# Patient Record
Sex: Male | Born: 1935 | Race: White | Hispanic: No | Marital: Married | Smoking: Former smoker
Health system: Southern US, Community
[De-identification: ages and names within clinical notes are randomized; demographics above are authoritative.]

## PROBLEM LIST (undated history)

## (undated) DIAGNOSIS — E785 Hyperlipidemia, unspecified: Secondary | ICD-10-CM

## (undated) DIAGNOSIS — I2510 Atherosclerotic heart disease of native coronary artery without angina pectoris: Secondary | ICD-10-CM

## (undated) DIAGNOSIS — K219 Gastro-esophageal reflux disease without esophagitis: Secondary | ICD-10-CM

## (undated) DIAGNOSIS — I493 Ventricular premature depolarization: Secondary | ICD-10-CM

## (undated) DIAGNOSIS — I5189 Other ill-defined heart diseases: Secondary | ICD-10-CM

## (undated) DIAGNOSIS — I10 Essential (primary) hypertension: Secondary | ICD-10-CM

## (undated) DIAGNOSIS — I739 Peripheral vascular disease, unspecified: Secondary | ICD-10-CM

## (undated) DIAGNOSIS — I4891 Unspecified atrial fibrillation: Secondary | ICD-10-CM

## (undated) DIAGNOSIS — I471 Supraventricular tachycardia, unspecified: Secondary | ICD-10-CM

## (undated) DIAGNOSIS — I6529 Occlusion and stenosis of unspecified carotid artery: Secondary | ICD-10-CM

## (undated) HISTORY — PX: TOTAL KNEE ARTHROPLASTY: SHX125

## (undated) HISTORY — DX: Hyperlipidemia, unspecified: E78.5

## (undated) HISTORY — DX: Essential (primary) hypertension: I10

## (undated) HISTORY — DX: Atherosclerotic heart disease of native coronary artery without angina pectoris: I25.10

## (undated) HISTORY — PX: CARDIAC CATHETERIZATION: SHX172

---

## 2003-07-11 ENCOUNTER — Other Ambulatory Visit: Payer: Self-pay

## 2003-07-15 ENCOUNTER — Inpatient Hospital Stay (HOSPITAL_COMMUNITY): Admission: EM | Admit: 2003-07-15 | Discharge: 2003-07-17 | Payer: Self-pay | Admitting: Cardiovascular Disease

## 2006-05-19 ENCOUNTER — Ambulatory Visit: Payer: Self-pay | Admitting: Gastroenterology

## 2006-05-26 ENCOUNTER — Emergency Department: Payer: Self-pay | Admitting: Emergency Medicine

## 2006-06-02 ENCOUNTER — Ambulatory Visit: Payer: Self-pay | Admitting: Internal Medicine

## 2006-06-10 ENCOUNTER — Ambulatory Visit: Payer: Self-pay | Admitting: Internal Medicine

## 2007-01-07 ENCOUNTER — Ambulatory Visit: Payer: Self-pay | Admitting: Internal Medicine

## 2007-12-08 ENCOUNTER — Ambulatory Visit: Payer: Self-pay | Admitting: Internal Medicine

## 2008-03-15 ENCOUNTER — Encounter: Payer: Self-pay | Admitting: Cardiovascular Disease

## 2008-04-17 ENCOUNTER — Encounter: Payer: Self-pay | Admitting: Cardiovascular Disease

## 2008-12-19 ENCOUNTER — Ambulatory Visit: Payer: Self-pay

## 2009-02-10 ENCOUNTER — Ambulatory Visit: Payer: Self-pay | Admitting: General Practice

## 2009-02-21 ENCOUNTER — Encounter: Payer: Self-pay | Admitting: Cardiovascular Disease

## 2009-02-24 ENCOUNTER — Ambulatory Visit: Payer: Self-pay | Admitting: General Practice

## 2009-06-04 ENCOUNTER — Ambulatory Visit: Payer: Medicare Other | Admitting: Gastroenterology

## 2009-06-19 ENCOUNTER — Ambulatory Visit: Payer: Self-pay | Admitting: Internal Medicine

## 2009-06-19 DIAGNOSIS — R6889 Other general symptoms and signs: Secondary | ICD-10-CM | POA: Insufficient documentation

## 2009-06-19 DIAGNOSIS — E782 Mixed hyperlipidemia: Secondary | ICD-10-CM | POA: Insufficient documentation

## 2009-07-10 ENCOUNTER — Encounter: Payer: Self-pay | Admitting: Cardiovascular Disease

## 2009-07-22 LAB — CONVERTED CEMR LAB
BUN: 27 mg/dL — ABNORMAL HIGH (ref 6–23)
CO2: 24 meq/L (ref 19–32)
Calcium: 8.7 mg/dL (ref 8.4–10.5)
Chloride: 103 meq/L (ref 96–112)
Cholesterol: 140 mg/dL (ref 0–200)
Creatinine, Ser: 0.97 mg/dL (ref 0.40–1.50)
HDL: 39 mg/dL — ABNORMAL LOW (ref >39)
Total CHOL/HDL Ratio: 3.6

## 2009-07-31 ENCOUNTER — Telehealth: Payer: Self-pay | Admitting: Cardiovascular Disease

## 2009-12-18 ENCOUNTER — Ambulatory Visit: Payer: Self-pay | Admitting: Cardiovascular Disease

## 2009-12-18 DIAGNOSIS — R079 Chest pain, unspecified: Secondary | ICD-10-CM | POA: Insufficient documentation

## 2009-12-18 DIAGNOSIS — I2510 Atherosclerotic heart disease of native coronary artery without angina pectoris: Secondary | ICD-10-CM | POA: Insufficient documentation

## 2009-12-22 ENCOUNTER — Telehealth (INDEPENDENT_AMBULATORY_CARE_PROVIDER_SITE_OTHER): Payer: Self-pay | Admitting: *Deleted

## 2009-12-23 ENCOUNTER — Encounter: Payer: Self-pay | Admitting: Cardiovascular Disease

## 2009-12-23 ENCOUNTER — Encounter (HOSPITAL_COMMUNITY)
Admission: RE | Admit: 2009-12-23 | Discharge: 2010-02-19 | Payer: Self-pay | Source: Home / Self Care | Admitting: Cardiovascular Disease

## 2009-12-23 ENCOUNTER — Ambulatory Visit: Payer: Self-pay

## 2009-12-23 ENCOUNTER — Ambulatory Visit: Payer: Self-pay | Admitting: Cardiovascular Disease

## 2010-05-19 ENCOUNTER — Ambulatory Visit: Payer: Self-pay | Admitting: Cardiovascular Disease

## 2010-05-19 ENCOUNTER — Encounter: Payer: Self-pay | Admitting: Cardiovascular Disease

## 2010-05-27 ENCOUNTER — Telehealth: Payer: Self-pay | Admitting: Cardiovascular Disease

## 2010-06-02 ENCOUNTER — Ambulatory Visit
Admission: RE | Admit: 2010-06-02 | Discharge: 2010-06-02 | Payer: Self-pay | Source: Home / Self Care | Attending: Cardiovascular Disease | Admitting: Cardiovascular Disease

## 2010-06-02 ENCOUNTER — Encounter: Payer: Self-pay | Admitting: Cardiovascular Disease

## 2010-06-04 ENCOUNTER — Ambulatory Visit: Payer: Medicare Other | Admitting: General Practice

## 2010-06-04 LAB — CONVERTED CEMR LAB
ALT: 21 units/L (ref 0–53)
Cholesterol: 149 mg/dL (ref 0–200)
Total CHOL/HDL Ratio: 4.4
Total Protein: 7.2 g/dL (ref 6.0–8.3)
Triglycerides: 217 mg/dL — ABNORMAL HIGH (ref <150)
VLDL: 43 mg/dL — ABNORMAL HIGH (ref 0–40)

## 2010-06-05 ENCOUNTER — Encounter: Payer: Self-pay | Admitting: Cardiovascular Disease

## 2010-06-05 ENCOUNTER — Telehealth: Payer: Self-pay | Admitting: Cardiovascular Disease

## 2010-06-10 ENCOUNTER — Telehealth: Payer: Self-pay | Admitting: Cardiovascular Disease

## 2010-06-15 ENCOUNTER — Inpatient Hospital Stay: Payer: Medicare Other | Admitting: General Practice

## 2010-06-19 ENCOUNTER — Encounter: Payer: Self-pay | Admitting: Cardiovascular Disease

## 2010-06-30 NOTE — Assessment & Plan Note (Signed)
Summary: Cardiology Nuclear Testing  Nuclear Med Background Indications for Stress Test: Evaluation for Ischemia, Stent Patency   History: Heart Catheterization, Myocardial Perfusion Study, Stents  History Comments: '05 Stents x 2-RCA, mid LAD with residual in OM; '09 MPS:no ischemia, EF=42%  Symptoms: Chest Tightness, Dizziness, SOB    Nuclear Pre-Procedure Caffeine/Decaff Intake: None NPO After: 6:00 PM Lungs: clear IV 0.9% NS with Angio Cath: 18g     IV Site: (R) AC IV Started by: Stanton Kidney EMT-P Chest Size (in) 46     Height (in): 71 Weight (lb): 217 BMI: 30.37  Nuclear Med Study 1 or 2 day study:  1 day     Stress Test Type:  Stress Reading MD:  Charlton Haws, MD     Referring MD:  T.Gollan Resting Radionuclide:  Technetium 80m Tetrofosmin     Resting Radionuclide Dose:  11.0 mCi  Stress Radionuclide:  Technetium 44m Tetrofosmin     Stress Radionuclide Dose:  33.0 mCi   Stress Protocol Exercise Time (min):  5:30 min     Max HR:  133 bpm     Predicted Max HR:  145 bpm  Max Systolic BP: 178 mm Hg     Percent Max HR:  91.72 %     METS: 7.0 Rate Pressure Product:  16109    Stress Test Technologist:  Milana Na EMT-P     Nuclear Technologist:  Domenic Polite CNMT  Rest Procedure  Myocardial perfusion imaging was performed at rest 45 minutes following the intravenous administration of Myoview Technetium 40m Tetrofosmin.  Stress Procedure  The patient exercised for 5:30. The patient stopped due to fatigue, sob, and denied any chest pain.  There were no significant ST-T wave changes.  Myoview was injected at peak exercise and myocardial perfusion imaging was performed after a brief delay.  QPS Raw Data Images:  Normal; no motion artifact; normal heart/lung ratio. Stress Images:  NI: Uniform and normal uptake of tracer in all myocardial segments. Rest Images:  Normal homogeneous uptake in all areas of the myocardium. Subtraction (SDS):  0 Transient Ischemic  Dilatation:  1.07  (Normal <1.22)  Lung/Heart Ratio:  .31  (Normal <0.45)  Quantitative Gated Spect Images QGS EDV:  83 ml QGS ESV:  27 ml QGS EF:  67 % QGS cine images:  normal  Findings Low risk nuclear study Evidence for inferior ischemia      Overall Impression  Exercise Capacity: Fair exercise capacity. BP Response: Normal blood pressure response. Clinical Symptoms: Dyspnea ECG Impression: No significant ST segment change suggestive of ischemia. Overall Impression: Mild inferior wall ischemia  Appended Document: Cardiology Nuclear Testing Preliminarily reviewed. Forwarded to MD desktop for review and signature    Please advise what to tell pt.   Appended Document: Cardiology Nuclear Testing Low risk scan. Small region of abnormality where the old stents were put in. If he is feeling ok, would just watch him. Use medications. If symptoms getting worse, or not feeling good, could consider cath.  Appended Document: Cardiology Nuclear Testing LMOM TCB  Appended Document: Cardiology Nuclear Testing pt aware of results.

## 2010-06-30 NOTE — Assessment & Plan Note (Signed)
Summary: CHEST TIGHTNESS/ALT   Visit Type:  Initial Consult Primary Provider:  Roe Coombs Chaplin,M.D.  CC:  c/o chest tightness that radiates to back and shoulder blades.  Shortness of breath when lying flat.Marland Kitchen  History of Present Illness: Christopher [name] is a very pleasant 77 year old gentleman with a history of coronary artery disease, stent placed to his distal RCA in 2005, also stent placed to his mid LAD at the same time with residual 50% obtuse marginal disease with repeat stress test in October 2009 showing no ischemia who presents for followup for symptoms of chest discomfort. He was last seen by myself at [facility] in September 2010.  He reports that over the past week, he had some chest discomfort. It has been constant, dizzy for the past 5-6 days. He feels like he wants to move his arms to make it go away. He has been lifting heavy equipment and wonders if he might have hurt himself. He did take some Aleve though is uncertain if this helped. It is uncertain if he gets worse with exertion. The discomfort is on both sides, lack of rectangle across his whole entire chest. Sometimes it radiates through to his back. He is uncertain if the symptoms are similar to his previous anginal symptoms.  He does have new stress in his life in that his wife has mild dementia. she does not want to be left alone and wants to travel with him wherever he goes.  Previous lipids from November 2009 showed total cholesterol 153, LDL 81.  Cardiac catheter September 2005 shows 90% mid LAD, 80% distal RCA, 50% OM1 at the mid vessel, Taxus stent 2.75 x 24 mm stent to his LAD, TAxus stent 2.5 x 16 mm stent to the RCA.  He reports having a recent carotid ultrasound at an outside facility that was reportedly normal.  EKG shows normal sinus rhythm with rate 68 beats per minute, no significant ST or T wave changes.  Preventive Screening-Counseling & Management  Alcohol-Tobacco     Smoking Status:  never  Caffeine-Diet-Exercise     Does Patient Exercise: yes      Drug Use:  no.    Current Medications (verified): 1)  Simvastatin 80 Mg Tabs (Simvastatin) .... Take One Tablet By Mouth Daily At Bedtime 2)  Plavix 75 Mg Tabs (Clopidogrel Bisulfate) .... Take One Tablet By Mouth Daily 3)  Amlodipine Besy-Benazepril Hcl 10-20 Mg Caps (Amlodipine Besy-Benazepril Hcl) .Marland Kitchen.. 1 By Mouth Once Daily 4)  Aspirin 81 Mg Tbec (Aspirin) .Marland Kitchen.. 1 Once Daily 5)  Vitamin D .... 1 Once Daily  Allergies (verified): No Known Drug Allergies  Past History:  Past Surgical History: CAD;Stents x 2 Right knee surg.  Family History: [id number]  Social History: Retired  Married  Tobacco Use - No.  Alcohol Use - no Regular Exercise - yes--walks 30 min. everyday. Drug Use - no Smoking Status:  never Does Patient Exercise:  yes Drug Use:  no  Review of Systems       The patient complains of chest pain.  The patient denies fever, weight loss, weight gain, vision loss, decreased hearing, hoarseness, syncope, dyspnea on exertion, peripheral edema, prolonged cough, abdominal pain, incontinence, muscle weakness, depression, and enlarged lymph nodes.    Vital Signs:  Patient profile:   77 year old male Height:      71 inches Weight:      219 pounds BMI:     30.65 Pulse rate:   68 / minute BP  sitting:   128 / 74  (left arm) Cuff size:   regular  Vitals Entered By: Bishop Dublin, CMA (December 18, 2009 10:42 AM)  Physical Exam  General:  Well developed, well nourished, in no acute distress. Head:  normocephalic and atraumatic Neck:  Neck supple, no JVD. No masses, thyromegaly or abnormal cervical nodes. Lungs:  Clear bilaterally to auscultation and percussion. Heart:  Non-displaced PMI, chest non-tender; regular rate and rhythm, S1, S2 without murmurs, rubs or gallops. Carotid upstroke normal, no bruit.  Pedals normal pulses. No edema, no varicosities. Abdomen:  Bowel sounds positive; abdomen soft  and non-tender without masses Msk:  Back normal, normal gait. Muscle strength and tone normal. Pulses:  pulses normal in all 4 extremities Extremities:  No clubbing or cyanosis. Neurologic:  Alert and oriented x 3. Skin:  Intact without lesions or rashes. Psych:  Normal affect.   Impression & Recommendations:  Problem # 1:  CHEST PAIN UNSPECIFIED (ICD-786.50) I am concerned about his chest pain given his history of coronary artery disease. This may be new angina over the past week. We'll set him up with a treadmill Myoview in [place]. He is in agreement with this as he is very concerned about his symptoms.  His updated medication list for this problem includes:    Plavix 75 Mg Tabs (Clopidogrel bisulfate) .Marland Kitchen... Take one tablet by mouth daily    Amlodipine Besy-benazepril Hcl 10-20 Mg Caps (Amlodipine besy-benazepril hcl) .Marland Kitchen... 1 by mouth once daily    Aspirin 81 Mg Tbec (Aspirin) .Marland Kitchen... 1 once daily  Orders: Nuclear Stress Test (Nuc Stress Test)  Problem # 2:  MIXED HYPERLIPIDEMIA (ICD-272.2) Previous lipids have been well controlled. He is on simvastatin 80 mg a day and we will decrease this to 40 mg daily for now and change him to Lipitor in November when this goes generic.  His updated medication list for this problem includes:    Simvastatin 80 Mg Tabs (Simvastatin) .Marland Kitchen... Take 1/2 tablet by mouth daily at bedtime  Other Orders: EKG w/ Interpretation (93000)  Patient Instructions: 1)  Your physician has recommended you make the following change in your medication: Simvastatin 80mg  take 1/2 daily 2)  Your physician has requested that you have an exercise stress myoview.  For further information please visit [email or web address]  Please follow instruction sheet, as given. 3)  Your physician wants you to follow-up in:   6 months You will receive a reminder letter in the mail two months in advance. If you don't receive a letter, please call our office to schedule the follow-up  appointment.

## 2010-06-30 NOTE — Cardiovascular Report (Signed)
Summary: Cardiac Cath Other  Cardiac Cath Other   Imported By: West Carbo 12/18/2009 15:36:28  _____________________________________________________________________  External Attachment:    Type:   Image     Comment:   External Document

## 2010-06-30 NOTE — Progress Notes (Signed)
Summary: Drug Interaction  Phone Note From Pharmacy Call back at [phone]   Caller: [facility] Call For: Dr. Mariah Milling  Summary of Call: [facility] called and left voicemail that there was a drug interaction on the medication(Lansoprazole) that was sent to them with his other meds.  Reference number is [id number] when you call them back. Initial call taken by: West Carbo,  July 31, 2009 7:57 AM  Follow-up for Phone Call        per Dr. Mariah Milling- pt can stay on nexium or switch to OTC acid medication- new rx can have severe interaction with plavix.  pt has decided to remain on nexium  Follow-up by: Charlena Cross, RN, BSN,  August 01, 2009 11:41 AM

## 2010-06-30 NOTE — Progress Notes (Signed)
Summary: RX  Phone Note Call from Patient Call back at Home Phone [phone]   Caller: SELF Call For: [facility] Summary of Call: PLEASE CALL [facility] [phone]-QUESTIONS ABOUT AN RX Initial call taken by: Harlon Flor,  July 31, 2009 3:09 PM  Follow-up for Phone Call        taken care of per [facility] Follow-up by: Mercer Pod,  August 05, 2009 10:11 AM

## 2010-06-30 NOTE — Progress Notes (Signed)
Summary: NUCLEAR PRE PROCEDURE  Phone Note Outgoing Call Call back at [facility] Phone [phone]   Call placed by: Rea College, CMA,  December 22, 2009 1:58 PM Call placed to: Patient Summary of Call: Left message with information on Myoview Information Sheet (see scanned document for details).      Nuclear Med Background Indications for Stress Test: Evaluation for Ischemia, Stent Patency   History: Heart Catheterization, Myocardial Perfusion Study, Stents  History Comments: '05 Stents x 2-RCA, mid LAD with residual in OM; '09 MPS:no ischemia, EF=42%     Nuclear Pre-Procedure Height (in): 71

## 2010-06-30 NOTE — Progress Notes (Signed)
Summary: Office Visit  Office Visit   Imported By: West Carbo 12/18/2009 15:37:01  _____________________________________________________________________  External Attachment:    Type:   Image     Comment:   External Document

## 2010-07-02 NOTE — Progress Notes (Signed)
Summary: Plavix clearance  Phone Note Call from Patient Call back at Home Phone [phone]   Caller: Self Call For: Michaeleen Down Summary of Call: [facility] wants the pt to come off of Plavix 7 days before surgery on the 16th.  [facility] needs this to be cleared by [facility] with a letter.  Please call the pt to inform of when to come off of the medication. Initial call taken by: Harlon Flor,  June 05, 2010 9:28 AM  Follow-up for Phone Call        Is having a total knee replacement and needs to come off plavix Monday 06/08/2010. Follow-up by: Bishop Dublin, CMA,  June 05, 2010 3:09 PM  Additional Follow-up for Phone Call Additional follow up Details #1::        Notified Dr. Elenor Legato office regarding coming off plavix Monday prior to knee surg. Told to come off Monday Jan. 9, 2012. Additional Follow-up by: Bishop Dublin, CMA,  June 05, 2010 3:30 PM

## 2010-07-02 NOTE — Progress Notes (Signed)
  Phone Note Call from Patient   Caller: Patient Summary of Call: Needs to have 30 day supply sent to [facility] for Omeprazole 20 mg one tablet once daily. Initial call taken by: Bishop Dublin, CMA,  June 10, 2010 8:44 AM  Follow-up for Phone Call        Phone Call Completed Follow-up by: Bishop Dublin, CMA,  June 10, 2010 8:44 AM    Prescriptions: OMEPRAZOLE 20 MG CPDR (OMEPRAZOLE) Take one tab by mouth once daily.  #30 x 6   Entered by:   Bishop Dublin, CMA   Authorized by:   Dossie Arbour MD   Signed by:   Bishop Dublin, CMA on 06/10/2010   Method used:   [facility]  [address] [phone] (retail)       [address]       [place], [place]  [address]       Ph: [phone] or [phone]       Fax: [phone]   RxID:   [id number]

## 2010-07-02 NOTE — Letter (Signed)
Summary: Surgical Clearance  Surgical Clearance   Imported By: Harlon Flor 06/08/2010 10:28:49  _____________________________________________________________________  External Attachment:    Type:   Image     Comment:   External Document

## 2010-07-02 NOTE — Progress Notes (Signed)
Summary: Surgical Clearance and RX  Phone Note Call from Patient Call back at Home Phone [phone]   Caller: Self Call For: Gollan Summary of Call: Pt needs surgical clearance for knee surgery.  Please fax to Dr. Ernest Pine @ [phone].  Pt called last week about having the generic form of Nexium sent to [facility] and has not heard anything about whether or not this has been taken care of.  Please make sure this has been sent to #[phone]. Initial call taken by: Harlon Flor,  May 27, 2010 3:29 PM  Follow-up for Phone Call        Faxed ov note from Dr. Mariah Milling for surgical clearance to Dr. Ernest Pine 12.28.11. Spoke to pt, notified we had sent rx in to [facility] and we received notification that insurance would not cover this med at [facility]. Pt is aware and states he spoke with [facility] and they said they had him in the system and he thinks they will fill this up until end of this year and pt will try this. Otherwise, pt will notify us with a different pharmacy for [place] to send rx.  Follow-up by: Lanny Hurst RN,  May 28, 2010 10:00 AM

## 2010-07-02 NOTE — Assessment & Plan Note (Signed)
Summary: F/U 6 MONTHS   Visit Type:  Follow-up Primary Provider:  Roe Coombs Chaplin,M.D.  CC:  F/U 6 months; denies chest pain or shortness of breath. Pre-op clearance for left knee replacement scheduled for January 16 and 2011 with Dr. Ernest Pine.Marland Kitchen  History of Present Illness: Christopher [name] is a very pleasant 77 year old gentleman with a history of coronary artery disease, stent placed to his distal RCA in 2005, also stent placed to his mid LAD at the same time with residual 50% obtuse marginal disease with repeat stress test in October 2009 showing no ischemia who presents for routine followup.   On his last clinic visit, he had episodes of chest pain. He has significant stress in his life as his wife has dementia. He has been feeling well more recently with no chest pain though does have significant stress with his wife. She does not want to leave his side.   He has been taking his medications with no significant side effects. No significant shortness of breath with exertion. He does have determined his knee pain and is here for preoperative evaluation.  Previous lipids from November 2009 showed total cholesterol 153, LDL 81.  Cardiac catheter September 2005 shows 90% mid LAD, 80% distal RCA, 50% OM1 at the mid vessel, Taxus stent 2.75 x 24 mm stent to his LAD, TAxus stent 2.5 x 16 mm stent to the RCA.  He reports having a recent carotid ultrasound at an outside facility that was reportedly normal.  recent stress test in July 2011 showed mild ischemia in the inferior wall consistent with previous stress test  EKG shows normal sinus rhythm with rate 68 beats per minute, no significant ST or T wave changes.  Current Medications (verified): 1)  Simvastatin 80 Mg Tabs (Simvastatin) .... Take 1/2 Tablet By Mouth Daily At Bedtime 2)  Plavix 75 Mg Tabs (Clopidogrel Bisulfate) .... Take One Tablet By Mouth Daily 3)  Amlodipine Besy-Benazepril Hcl 10-20 Mg Caps (Amlodipine Besy-Benazepril Hcl) .Marland Kitchen.. 1 By Mouth  Once Daily 4)  Aspirin 81 Mg Tbec (Aspirin) .Marland Kitchen.. 1 Once Daily 5)  Vitamin D .... 1 Once Daily  Allergies (verified): No Known Drug Allergies  Past History:  Past Medical History: Last updated: 07/10/2009 CAd stent to the mid LAD and distal  RCA 2005 Neg-stress test no significant ischemia hyperlipidemia hypertension  Past Surgical History: Last updated: 12/18/2009 CAD;Stents x 2 Right knee surg.  Family History: Last updated: 12/18/2009 [id number]  Social History: Last updated: 12/18/2009 Retired  Married  Tobacco Use - No.  Alcohol Use - no Regular Exercise - yes--walks 30 min. everyday. Drug Use - no  Risk Factors: Exercise: yes (12/18/2009)  Risk Factors: Smoking Status: never (12/18/2009)  Review of Systems       The patient complains of difficulty walking.  The patient denies fever, weight loss, weight gain, vision loss, decreased hearing, hoarseness, chest pain, syncope, dyspnea on exertion, peripheral edema, prolonged cough, abdominal pain, incontinence, muscle weakness, depression, and enlarged lymph nodes.         Knee pain  Vital Signs:  Patient profile:   77 year old male Height:      71 inches Weight:      221 pounds BMI:     30.93 Pulse rate:   68 / minute BP sitting:   124 / 68  (left arm) Cuff size:   regular  Vitals Entered By: Bishop Dublin, CMA (May 19, 2010 3:29 PM)  Physical Exam  General:  Well developed, well nourished,  in no acute distress. Head:  normocephalic and atraumatic Neck:  Neck supple, no JVD. No masses, thyromegaly or abnormal cervical nodes. Lungs:  Clear bilaterally to auscultation and percussion. Heart:  Non-displaced PMI, chest non-tender; regular rate and rhythm, S1, S2 without murmurs, rubs or gallops. Carotid upstroke normal, no bruit.  Pedals normal pulses. No edema, no varicosities. Abdomen:  Bowel sounds positive; abdomen soft and non-tender without masses Msk:  Back normal, normal gait. Muscle strength  and tone normal. Pulses:  pulses normal in all 4 extremities Extremities:  No clubbing or cyanosis. Neurologic:  Alert and oriented x 3. Skin:  Intact without lesions or rashes. Psych:  Normal affect.   Impression & Recommendations:  Problem # 1:  CAD (ICD-414.00) no symptoms of angina at this time. Recent stress test that is essentially unchanged with mild inferior wall abnormality. No further testing needed. He would be low risk for his oncoming knee surgery with Dr. Ernest Pine.  I will suggest that IV fluids be minimized given suspected diastolic dysfunction and the risk for perioperative arrhythmia. I will start him on metoprolol 12.5 mg b.i.d. now to be continued indefinitely as tolerated.  His updated medication list for this problem includes:    Plavix 75 Mg Tabs (Clopidogrel bisulfate) .Marland Kitchen... Take one tablet by mouth daily    Amlodipine Besy-benazepril Hcl 10-20 Mg Caps (Amlodipine besy-benazepril hcl) .Marland Kitchen... 1 by mouth once daily    Aspirin 81 Mg Tbec (Aspirin) .Marland Kitchen... 1 once daily    Metoprolol Tartrate 25 Mg Tabs (Metoprolol tartrate) .Marland Kitchen... Take 1/2 tablet by mouth two times a day.  Problem # 2:  MIXED HYPERLIPIDEMIA (ICD-272.2) We have suggested that we check his cholesterol prior to his knee surgery in January as his dose of simvastatin has decreased from 80 mg to 40 mg. Goal LDL is less than 70.  His updated medication list for this problem includes:    Simvastatin 80 Mg Tabs (Simvastatin) .Marland Kitchen... Take 1/2 tablet by mouth daily at bedtime  Patient Instructions: 1)  Your physician recommends that you schedule a follow-up appointment in: 6 months 2)  Your physician recommends that you return for a FASTING lipid profile: LIPID/LFT'S JAN 2012 3)  Your physician has recommended you make the following change in your medication: START Metoprolol Tartrate 25mg  1/2 tab two times a day. START Omeprazole 20mg  once daily. Prescriptions: OMEPRAZOLE 20 MG CPDR (OMEPRAZOLE) Take one tab by  mouth once daily.  #30 x 6   Entered by:   Lanny Hurst RN   Authorized by:   Christopher Arbour MD   Signed by:   Lanny Hurst RN on 05/19/2010   Method used:   [facility]axed to ...       [facility] [place] [facility]             , [place]         Ph: [phone]       Fax: [phone]   RxID:   [id number] METOPROLOL TARTRATE 25 MG TABS (METOPROLOL TARTRATE) Take 1/2 tablet by mouth two times a day.  #90 x 3   Entered by:   Lanny Hurst RN   Authorized by:   Christopher Arbour MD   Signed by:   Lanny Hurst RN on 05/19/2010   Method used:   Faxed to ...       [facility] [place] [facility]retail)             , [place]         Ph: [phone]  Fax: [phone]   RxID:   [id number]

## 2010-07-08 NOTE — Miscellaneous (Signed)
[facility]   Imported By: Harlon Flor 07/02/2010 14:45:42  _____________________________________________________________________  External Attachment:    Type:   Image     Comment:   External Document

## 2010-07-30 ENCOUNTER — Telehealth: Payer: Self-pay | Admitting: Cardiovascular Disease

## 2010-08-06 NOTE — Progress Notes (Signed)
Summary: Metoprolol  Phone Note Call from Patient Call back at Home Phone [phone]   Caller: SELF Call For: [facility] Summary of Call: Pt would like to know if he is to continue Metoprolol or if this was a medication that was started solely for his surgery. Initial call taken by: Harlon Flor,  July 30, 2010 8:51 AM  Follow-up for Phone Call        notified patient need to continue with the metoprolol.  Patient was just making sure of this before had refilled. Follow-up by: Bishop Dublin, CMA,  July 30, 2010 1:03 PM

## 2010-08-26 ENCOUNTER — Telehealth: Payer: Self-pay

## 2010-08-26 MED ORDER — CLOPIDOGREL BISULFATE 75 MG PO TABS: 75.0000 mg | ORAL_TABLET | Freq: Every day | ORAL | Status: DC

## 2010-08-26 NOTE — Telephone Encounter (Signed)
Needs refill on Plavix 75mg  one tablet daily.  Send to [facility] for 90 day supply.

## 2010-08-28 ENCOUNTER — Other Ambulatory Visit: Payer: Self-pay | Admitting: Cardiovascular Disease

## 2010-08-28 MED ORDER — CLOPIDOGREL BISULFATE 75 MG PO TABS: 75.0000 mg | ORAL_TABLET | Freq: Every day | ORAL | Status: DC

## 2010-08-28 NOTE — Telephone Encounter (Signed)
Refill sent to [facility] for plavix 90 day supply.

## 2010-08-28 NOTE — Telephone Encounter (Signed)
Rx faxed to [facility] for plavix 90 day supply.

## 2010-08-28 NOTE — Telephone Encounter (Signed)
Needs Plavix sent to [facility].  Pt refused the Rx that was sent to the pharmacy.

## 2010-10-16 NOTE — Cardiovascular Report (Signed)
NAME:  Christopher [name], Christopher [name]                            ACCOUNT NO.:  [id number]   MEDICAL RECORD NO.:  [id number]                   PATIENT TYPE:  INP   LOCATION:  6599                                 FACILITY:  [facility]   PHYSICIAN:  Madaline Savage, M.D.             DATE OF BIRTH:  [birth year]   DATE OF PROCEDURE:  07/16/2003  DATE OF DISCHARGE:                              CARDIAC CATHETERIZATION   PROCEDURES PERFORMED:  1. Selective coronary angiography by Judkins technique.  2. Retrograde left heart catheterization.  3. Left ventricular angiography.  4. Abdominal aortography.  5. Percutaneous coronary intervention with stenting of the mid left anterior     descending.  6. Percutaneous coronary stenting of the distal right coronary artery.   CARDIOLOGIST:  Madaline Savage, M.D.   COMPLICATIONS:  None.   ENTRY SITE:  Right femoral.   CONTRAST MATERIAL:  Dye used; Omnipaque.   MEDICATIONS:  Medications given; Angiomax during the procedure with an ACT  of approximately 430 seconds; 600 mg of Plavix was given in split doses of  300 at the beginning of the case and 300 mg at the end of the case, fentanyl  for sedation, and intravenous morphine for chest pain.   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  One of his parents had coronary  disease; and, he has had negative cardiac enzymes and EKGs prior to cath lab  entry.   RESULTS:   PRESSURES:  The left ventricular pressure  was 115/0, end-diastolic pressure  11.  Central aortic pressure 115/65, mean of 90.  No aortic valve gradient  by pullback technique.   ANGIOGRAPHIC DATA:  Left Main Coronary Artery:  The left main coronary  artery was large in caliber, short in length and showed no lesions.   Left Anterior Coronary Artery:  The left anterior descending coronary artery  coursed the  cardiac apex and gave rise to two small diagonal branches and a  third smaller diagonal branch.  There were three septal perforator branches  noted.  The LAD showed a lesion in the midportion of the vessel between  septal perforators #1 and #2 over a course of about 20-22 mm in severity,  and a gradually tapering lesion of 90% severity.  It was long and it was  mildly calcified.   Circumflex Coronary Artery:  The circumflex coronary artery gave rise to one  trifurcating obtuse marginal branch, which contained a 50% lesion in the  midportion of this vessel before either of the two branches more distally.  The circumflex itself coursed through the AV groove and gave rise to a  bifurcating atrial circumflex branch.  The circumflex itself contained no  significant lesions.   Right Coronary Artery:  The right coronary artery was technically the  dominant vessel  with circulation giving rise to a medium-sized posterior  descending branch and also gave rise to two acute marginal branches.  There  was a lesion of 80% in the distal portion of the RCA just before the PDA  arose.   Left Ventricular Angiography:  Left ventricular angiography showed normal  left ventricular contractility, no wall motion abnormalities and an ejection  fraction estimated at 60-65% with only a whiff of catheter-induced mitral  regurgitation.   AORTOGRAPHIC DATA:  Abdominal aortography showed bilateral patency of the  renal arteries.  The right renal artery actually contained about a 20-30%  plaque in its proximal portion, but the vessel was large in diameter.  The  abdominal  aorta was smooth and the common iliacs were normal.   DESCRIPTION OF INTERVENTIONAL PROCEDURE:  Percutaneous coronary stenting was  accomplished by switching the 6 French sheath system from diagnostic  catheterization over to a 7 French sheath system for the percutaneous  intervention.  A 7 French Judkins left guide catheter was used with a 180  cm  Forte wire.  The LAD was predilated with a 2.5 x 15 Maverick balloon  inflated to four atmospheres of pressure because of my concern that we may  not be able to cross easily with the TAXUS stent due to the severity of the  90% lesion.   Next, I strategically placed a 24 mm x 2.75 TAXUS stent just prior to the  takeoff of the septal branch and near the second septal perforator branch,  which covered the stenotic mid LAD stenosis well.  I inflated to peak  atmospheres of 17 atmospheres on two occasions corresponding to an  anticipated inner diameter of 3 mm.  TIMI III flow was preserved distally  and smooth contours were noted after stenting.   The circumflex lesion was not addressed at this time.   The right coronary artery lesion was addressed with a 7 French right guide  catheter with side holes , the same Forte, and was direct stented with a 2.5  x 16 mm TAXUS stent.  This TAXUS stent was deployed twice to 16 atmospheres  of pressure and the 80% short segmental stenosis was reduced to 0% residual  with preservation of TIMI III Class distal flow.   The patient tolerated the procedure well.  The LAD stenting caused 8/10  chest pain.  It persisted for some eight to 10 minutes, probably related to  the second diagonal branch being jailed, but this pain went down to 1/10 by  the end of the case.  No hemodynamic or rhythm complications occurred.   FINAL IMPRESSION:  1. Three-vessel coronary artery disease:     A. Ninety percent mid left anterior descending stenosis.     B. Fifty percent first obtuse marginal branch stenosis, midvessel.     C. Eighty percent distal right coronary artery stenosis.  2. Normal left ventricular systolic function.  3. Normal renal arteries.  4. Normal abdominal aorta.  5. Successful stenting of the mid left anterior descending with reduction of     a 90% lesion to 0% residual. 6. Successful stenting of the distal right coronary artery with 80% lesion      reduced to 0% residual.   PLAN:  The patient will have aspirin and Plavix for six months.   The patient received Angiomax during the case and the Angiomax was  discontinued following completion of the procedure.  Madaline Savage, M.D.    WHG/MEDQ  D:  07/16/2003  T:  07/17/2003  Job:  [id number]   cc:   Darlin Priestly, M.D.  [address]  [place]  [place] [address]  Fax: [phone]   [facility]  [place], [place] [facility]

## 2010-10-16 NOTE — Discharge Summary (Signed)
NAME:  Christopher [name], Christopher [name]                            ACCOUNT NO.:  [id number]   MEDICAL RECORD NO.:  [id number]                   PATIENT TYPE:  INP   LOCATION:  6523                                 FACILITY:  [facility]   PHYSICIAN:  Nanetta Batty, M.D.                DATE OF BIRTH:  24-Dec-1931   DATE OF ADMISSION:  07/15/2003  DATE OF DISCHARGE:  07/17/2003                                 DISCHARGE SUMMARY   ATTENDING PHYSICIANS:  1. Dr. Nanetta Batty.  2. Dr. Madaline Savage.   ADMISSION DIAGNOSES:  1. Chest pain, rule out angina.  2. Unknown lipid status.   DISCHARGE DIAGNOSES:  1. Severe two-vessel coronary artery disease with angina, status post     percutaneous transluminal coronary angioplasty and TAXUS stent x2.  2. Hyperlipidemia.   BRIEF HISTORY:  The patient is a 77 year old white male, a medical patient  of Dr. Conchita Paris in [place], [place], who had been referred to  Dr. Darlin Priestly for initial evaluation and was scheduled to come the  week of his admission.  He had no prior cardiac history prior to this.  He  had his first episode of chest discomfort on a Wednesday while he was  working in the yard.  He has had a recurrence and ultimately came to the ER  at [facility], had a Cardiolite which showed a normal ejection fraction of  69%, but some inferior myocardial perfusion defects consistent with prior  infarct and scar.  There was no evidence of ischemia at that time.  He now  presents with chest pain.  He was seen in the ER by Dr. Allyson Sabal and was  admitted for further evaluation and treatment as indicated.  His EKG showed  a ventricular bigeminy.  He was placed on heparin, nitroglycerin, aspirin,  beta blockers and scheduled for cardiac catheterization.   PAST MEDICAL HISTORY:  No prior hospitalizations.   CURRENT MEDICATIONS:  Prilosec.   SOCIAL HISTORY:  He just retired, about 1 week.  No history of alcohol and  no history of tobacco.   FAMILY  HISTORY:  Mother had a questionable history of angina with PCI.  No  other family history.   ALLERGIES:  None.   COMMENT:  For further H&P, see the dictated note.   HOSPITAL COURSE:  The patient was admitted.  He was stable on IV  nitroglycerin and heparin.  He underwent cardiac catheterization on July 15, 2002.  This showed a 90% stenosis of the LAD.  He had a 50% stenosis of  the midportion of the first OM.  He had an 80% distal RCA before the takeoff  of the PD and PL.  He underwent PCI and TAXUS stenting to the proximal LAD  lesion and the distal RCA lesion.  He tolerated this well and returned to  the floor and telemetry unit in satisfactory condition.  The following day,  the labs were normal with a hemoglobin of 14, hematocrit of 40, white count  of 8.5, platelets 161,000.  Electrolytes were normal with a BUN of 12, a  creatinine of 1.1, a glucose of 103.  His cholesterol was 177, triglycerides  are 236, HDL is 31, LDL was 99.  Overall, he was doing well, he had no  further chest discomfort.  He was stable on Lopressor at 25 mg b.i.d.  We  plan to discharge the patient home today.   DISCHARGE MEDICATIONS:  1. Toprol-XL 25 mg daily.  2. He will resume his Prilosec 1 daily at home.  3. Vytorin 10/20 one daily.  4. Plavix 75 mg daily.  5. Aspirin 81 mg daily.   FOLLOWUP:  We will arrange followup with Dr. Madaline Savage in 2 weeks.  He is instructed to contact Dr. Candelaria Stagers for followup in his office.   DISCHARGE ACTIVITY:  Light to moderate, no lifting over 10 pounds, no  driving for 1 week.   CONDITION ON DISCHARGE:  Improved.      Eber Hong, P.A.                 Nanetta Batty, M.D.    WDJ/MEDQ  D:  07/17/2003  T:  07/17/2003  Job:  [id number]   cc:   Conchita Paris  [address]  [place]  [place] [address]  Fax: [phone]

## 2010-11-03 ENCOUNTER — Encounter: Payer: Self-pay | Admitting: Cardiovascular Disease

## 2010-11-03 ENCOUNTER — Ambulatory Visit (INDEPENDENT_AMBULATORY_CARE_PROVIDER_SITE_OTHER): Payer: Medicare Other | Admitting: Cardiovascular Disease

## 2010-11-03 DIAGNOSIS — R6889 Other general symptoms and signs: Secondary | ICD-10-CM

## 2010-11-03 DIAGNOSIS — R079 Chest pain, unspecified: Secondary | ICD-10-CM

## 2010-11-03 DIAGNOSIS — I2510 Atherosclerotic heart disease of native coronary artery without angina pectoris: Secondary | ICD-10-CM

## 2010-11-03 DIAGNOSIS — E782 Mixed hyperlipidemia: Secondary | ICD-10-CM

## 2010-11-03 MED ORDER — ATORVASTATIN CALCIUM 80 MG PO TABS: 80.0000 mg | ORAL_TABLET | Freq: Every day | ORAL | Status: DC

## 2010-11-03 NOTE — Progress Notes (Signed)
   Patient ID: Christopher [name], male    DOB: 10-06-1931, 77 y.o.   MRN: [id number]  HPI Comments: Christopher [name] is a very pleasant 77 year old gentleman with a history of coronary artery disease, stent placed to his distal RCA in 2005, also stent placed to his mid LAD at the same time with residual 50% obtuse marginal disease with repeat stress test in October 2009 showing no ischemia who presents for routine followup.     He reports that he feels well with no significant symptoms of chest pain. He has significant stress in his life as his wife has dementia.  She does not want to leave his side.  His routine has become significantly limited. 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 He did have a successful left total knee replacement in January of this year.   He has been taking his medications with no significant side effects. No significant shortness of breath with exertion.   Cardiac catheter September 2005 shows 90% mid LAD, 80% distal RCA, 50% OM1 at the mid vessel, Taxus stent 2.75 x 24 mm stent to his LAD, TAxus stent 2.5 x 16 mm stent to the RCA.   He reports having a recent carotid ultrasound at an outside facility that was reportedly normal.   stress test in July 2011 showed mild ischemia in the inferior wall consistent with previous stress test   EKG shows normal sinus rhythm with rate 60 beats per minute, no significant ST or T wave changes.      Review of Systems  Constitutional: Negative.   HENT: Negative.   Eyes: Negative.   Respiratory: Negative.   Cardiovascular: Negative.   Gastrointestinal: Negative.   Musculoskeletal: Negative.   Skin: Negative.   Neurological: Negative.   Hematological: Negative.   Psychiatric/Behavioral: Negative.   All other systems reviewed and are negative.    BP 124/68  Pulse 60  Ht 5\' 11"  (1.803 m)  Wt 217 lb 12.8 oz (98.793 kg)  BMI 30.38 kg/m2   Physical Exam  Nursing note and  vitals reviewed. Constitutional: He is oriented to person, place, and time. He appears well-developed and well-nourished.  HENT:  Head: Normocephalic.  Nose: Nose normal.  Mouth/Throat: Oropharynx is clear and moist.  Eyes: Conjunctivae are normal. Pupils are equal, round, and reactive to light.  Neck: Normal range of motion. Neck supple. No JVD present.  Cardiovascular: Normal rate, regular rhythm, S1 normal, S2 normal, normal heart sounds and intact distal pulses.  Exam reveals no gallop and no friction rub.   No murmur heard. Pulmonary/Chest: Effort normal and breath sounds normal. No respiratory distress. He has no wheezes. He has no rales. He exhibits no tenderness.  Abdominal: Soft. Bowel sounds are normal. He exhibits no distension. There is no tenderness.  Musculoskeletal: Normal range of motion. He exhibits no edema and no tenderness.  Lymphadenopathy:    He has no cervical adenopathy.  Neurological: He is alert and oriented to person, place, and time. Coordination normal.  Skin: Skin is warm and dry. No rash noted. No erythema.  Psychiatric: He has a normal mood and affect. His behavior is normal. Judgment and thought content normal.           Assessment and Plan

## 2010-11-03 NOTE — Assessment & Plan Note (Signed)
Currently with no symptoms of angina. No further workup at this time. Continue current medication regimen. 

## 2010-11-03 NOTE — Assessment & Plan Note (Signed)
No chest pain symptoms at this time. No further testing has been ordered. Stress test done last year showing no significant ischemia.

## 2010-11-03 NOTE — Assessment & Plan Note (Signed)
He is still on high-dose simvastatin and as he is on amlodipine, we will change this to Lipitor 40 mg daily

## 2010-11-03 NOTE — Patient Instructions (Signed)
You are doing well. Please stop simvastatin and start lipitor  Please call us if you have new issues that need to be addressed before your next appt.  We will call you for a follow up Appt. In 6 months

## 2010-12-04 ENCOUNTER — Other Ambulatory Visit: Payer: Self-pay | Admitting: Cardiovascular Disease

## 2010-12-04 MED ORDER — CLOPIDOGREL BISULFATE 75 MG PO TABS: 75.0000 mg | ORAL_TABLET | Freq: Every day | ORAL | Status: DC

## 2010-12-04 NOTE — Telephone Encounter (Signed)
Would like the generic Plavix for 90 days.

## 2010-12-28 ENCOUNTER — Other Ambulatory Visit: Payer: Self-pay

## 2010-12-28 ENCOUNTER — Telehealth: Payer: Self-pay

## 2010-12-28 MED ORDER — OMEPRAZOLE 20 MG PO CPDR: 20.0000 mg | DELAYED_RELEASE_CAPSULE | Freq: Every day | ORAL | Status: DC

## 2010-12-28 NOTE — Telephone Encounter (Signed)
Needs a refill on omeprazole

## 2010-12-29 ENCOUNTER — Other Ambulatory Visit: Payer: Self-pay

## 2010-12-29 MED ORDER — OMEPRAZOLE 20 MG PO CPDR: 20.0000 mg | DELAYED_RELEASE_CAPSULE | Freq: Every day | ORAL | Status: DC

## 2010-12-29 MED ORDER — METOPROLOL TARTRATE 25 MG PO TABS: 12.5000 mg | ORAL_TABLET | Freq: Two times a day (BID) | ORAL | Status: DC

## 2010-12-30 NOTE — Telephone Encounter (Signed)
Duplicate message. 

## 2011-01-26 ENCOUNTER — Telehealth: Payer: Self-pay

## 2011-01-26 MED ORDER — OMEPRAZOLE 20 MG PO CPDR: 20.0000 mg | DELAYED_RELEASE_CAPSULE | Freq: Every day | ORAL | Status: DC

## 2011-01-26 NOTE — Telephone Encounter (Signed)
Refill requested for omeprazole.

## 2011-05-07 ENCOUNTER — Encounter: Payer: Self-pay | Admitting: Cardiovascular Disease

## 2011-05-19 ENCOUNTER — Encounter: Payer: Self-pay | Admitting: Cardiovascular Disease

## 2011-05-19 ENCOUNTER — Ambulatory Visit (INDEPENDENT_AMBULATORY_CARE_PROVIDER_SITE_OTHER): Payer: Medicare Other | Admitting: Cardiovascular Disease

## 2011-05-19 VITALS — BP 130/80 | HR 64 | Ht 71.0 in | Wt 223.0 lb

## 2011-05-19 DIAGNOSIS — E782 Mixed hyperlipidemia: Secondary | ICD-10-CM

## 2011-05-19 DIAGNOSIS — I739 Peripheral vascular disease, unspecified: Secondary | ICD-10-CM

## 2011-05-19 DIAGNOSIS — I2510 Atherosclerotic heart disease of native coronary artery without angina pectoris: Secondary | ICD-10-CM

## 2011-05-19 NOTE — Assessment & Plan Note (Signed)
Currently with no symptoms of angina. No further workup at this time. Continue current medication regimen. 

## 2011-05-19 NOTE — Assessment & Plan Note (Signed)
Cholesterol is at goal on the current lipid regimen. No changes to the medications were made.  

## 2011-05-19 NOTE — Assessment & Plan Note (Signed)
Mild to moderate carotid arterial disease the estimated at 40% on the right. Repeat in 2 years

## 2011-05-19 NOTE — Patient Instructions (Signed)
You are doing well. No medication changes were made.  Please call us if you have new issues that need to be addressed before your next appt.  Your physician wants you to follow-up in: 6 months.  You will receive a reminder letter in the mail two months in advance. If you don't receive a letter, please call our office to schedule the follow-up appointment.   

## 2011-05-19 NOTE — Progress Notes (Signed)
Patient ID: Christopher [name], male    DOB: September 27, 1931, 77 y.o.   MRN: [id number]  HPI Comments: Christopher [name] is a very pleasant 77 year old gentleman with a history of coronary artery disease, stent placed to his distal RCA in 2005, also stent placed to his mid LAD at the same time with residual 50% obtuse marginal disease with repeat stress test in October 2009 showing no ischemia who presents for routine followup.    Records from Dr. Earnestine Leys showed no significant aortic disease, mild carotid plaquing with mixed irregular plaque estimated at 40% on the right,   He reports that he feels well with no significant symptoms of chest pain. He has significant stress in his life as his wife has dementia.  She does not want to leave his side.  His routine has become significantly limited. 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 He did have a successful left total knee replacement in January of this year. He has been taking his medications with no significant side effects. No significant shortness of breath with exertion.   Cardiac catheter September 2005 shows 90% mid LAD, 80% distal RCA, 50% OM1 at the mid vessel, Taxus stent 2.75 x 24 mm stent to his LAD, TAxus stent 2.5 x 16 mm stent to the RCA.   stress test in July 2011 showed mild ischemia in the inferior wall consistent with previous stress test   EKG shows normal sinus rhythm with rate 64 beats per minute, no significant ST or T wave changes.    Outpatient Encounter Prescriptions as of 05/19/2011  Medication Sig Dispense Refill  . amLODipine-benazepril (LOTREL) 10-20 MG per capsule Take 1 capsule by mouth daily.        Marland Kitchen aspirin 81 MG EC tablet Take 81 mg by mouth daily.        Marland Kitchen atorvastatin (LIPITOR) 80 MG tablet Take 1 tablet (80 mg total) by mouth daily.  90 tablet  3  . clopidogrel (PLAVIX) 75 MG tablet Take 1 tablet (75 mg total) by mouth daily.  90 tablet  3  . metoprolol tartrate  (LOPRESSOR) 25 MG tablet Take 0.5 tablets (12.5 mg total) by mouth 2 (two) times daily.  90 tablet  3  . omeprazole (PRILOSEC) 20 MG capsule Take 1 capsule (20 mg total) by mouth daily.  30 capsule  6  . VITAMIN D, ERGOCALCIFEROL, PO Take 1 tablet by mouth daily.           Review of Systems  Constitutional: Negative.   HENT: Negative.   Eyes: Negative.   Respiratory: Negative.   Cardiovascular: Negative.   Gastrointestinal: Negative.   Musculoskeletal: Negative.   Skin: Negative.   Neurological: Negative.   Hematological: Negative.   Psychiatric/Behavioral: Negative.   All other systems reviewed and are negative.    BP 130/80  Pulse 64  Ht 5\' 11"  (1.803 m)  Wt 223 lb (101.152 kg)  BMI 31.10 kg/m2   Physical Exam  Nursing note and vitals reviewed. Constitutional: He is oriented to person, place, and time. He appears well-developed and well-nourished.  HENT:  Head: Normocephalic.  Nose: Nose normal.  Mouth/Throat: Oropharynx is clear and moist.  Eyes: Conjunctivae are normal. Pupils are equal, round, and reactive to light.  Neck: Normal range of motion. Neck supple. No JVD present.  Cardiovascular: Normal rate, regular rhythm, S1 normal, S2 normal, normal heart sounds and intact distal pulses.  Exam reveals no  gallop and no friction rub.   No murmur heard. Pulmonary/Chest: Effort normal and breath sounds normal. No respiratory distress. He has no wheezes. He has no rales. He exhibits no tenderness.  Abdominal: Soft. Bowel sounds are normal. He exhibits no distension. There is no tenderness.  Musculoskeletal: Normal range of motion. He exhibits no edema and no tenderness.  Lymphadenopathy:    He has no cervical adenopathy.  Neurological: He is alert and oriented to person, place, and time. Coordination normal.  Skin: Skin is warm and dry. No rash noted. No erythema.  Psychiatric: He has a normal mood and affect. His behavior is normal. Judgment and thought content normal.             Assessment and Plan

## 2011-08-26 ENCOUNTER — Encounter: Payer: Self-pay | Admitting: Cardiovascular Disease

## 2011-10-07 ENCOUNTER — Other Ambulatory Visit: Payer: Self-pay | Admitting: Cardiovascular Disease

## 2011-10-25 ENCOUNTER — Other Ambulatory Visit: Payer: Self-pay | Admitting: Cardiovascular Disease

## 2011-10-26 ENCOUNTER — Other Ambulatory Visit: Payer: Self-pay | Admitting: *Deleted

## 2011-10-26 MED ORDER — AMLODIPINE BESY-BENAZEPRIL HCL 10-20 MG PO CAPS: 1.0000 | ORAL_CAPSULE | Freq: Every day | ORAL | Status: DC

## 2011-10-26 NOTE — Telephone Encounter (Signed)
Refilled amLODipine-benazepril.

## 2011-11-16 ENCOUNTER — Ambulatory Visit (INDEPENDENT_AMBULATORY_CARE_PROVIDER_SITE_OTHER): Payer: Medicare Other | Admitting: Cardiovascular Disease

## 2011-11-16 ENCOUNTER — Encounter: Payer: Self-pay | Admitting: Cardiovascular Disease

## 2011-11-16 VITALS — BP 134/72 | HR 63 | Ht 71.0 in | Wt 219.0 lb

## 2011-11-16 DIAGNOSIS — I2510 Atherosclerotic heart disease of native coronary artery without angina pectoris: Secondary | ICD-10-CM

## 2011-11-16 DIAGNOSIS — E782 Mixed hyperlipidemia: Secondary | ICD-10-CM

## 2011-11-16 DIAGNOSIS — I739 Peripheral vascular disease, unspecified: Secondary | ICD-10-CM

## 2011-11-16 MED ORDER — ATORVASTATIN CALCIUM 80 MG PO TABS: 80.0000 mg | ORAL_TABLET | Freq: Every day | ORAL | Status: DC

## 2011-11-16 MED ORDER — METOPROLOL TARTRATE 25 MG PO TABS: 12.5000 mg | ORAL_TABLET | Freq: Two times a day (BID) | ORAL | Status: DC

## 2011-11-16 MED ORDER — CLOPIDOGREL BISULFATE 75 MG PO TABS: 75.0000 mg | ORAL_TABLET | Freq: Every day | ORAL | Status: DC

## 2011-11-16 NOTE — Assessment & Plan Note (Signed)
Mild carotid plaquing. Continue aggressive cholesterol management.

## 2011-11-16 NOTE — Patient Instructions (Signed)
You are doing well. No medication changes were made.  Please call us if you have new issues that need to be addressed before your next appt.  Your physician wants you to follow-up in: 6 months.  You will receive a reminder letter in the mail two months in advance. If you don't receive a letter, please call our office to schedule the follow-up appointment.   

## 2011-11-16 NOTE — Assessment & Plan Note (Signed)
Cholesterol is at goal on the current lipid regimen. No changes to the medications were made.  

## 2011-11-16 NOTE — Assessment & Plan Note (Signed)
Currently with no symptoms of angina. No further workup at this time. Continue current medication regimen. 

## 2011-11-16 NOTE — Progress Notes (Signed)
Patient ID: JOESEPH VERVILLE, male    DOB: May 13, 1932, 78 y.o.   MRN: [id number]  HPI Comments: Mr. Kreiser is a very pleasant 78 year old gentleman with a history of coronary artery disease, stent placed to his distal RCA in 2005, also stent placed to his mid LAD at the same time with residual 50% obtuse marginal disease with repeat stress test in October 2009 showing no ischemia who presents for routine followup.    Records from Dr. Earnestine Leys showed no significant aortic disease, mild carotid plaquing with mixed irregular plaque estimated at 40% on the right,    feels well with no significant symptoms of chest pain. Continued significant stress in his life as his wife has dementia.   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 He did have a successful left total knee replacement.  No significant shortness of breath with exertion. He has been having some problems with his teeth and wonders if it could be side effect from the medication. This is being managed by his dentist. Otherwise no new active issues.   Cardiac catheter September 2005 shows 90% mid LAD, 80% distal RCA, 50% OM1 at the mid vessel, Taxus stent 2.75 x 24 mm stent to his LAD, TAxus stent 2.5 x 16 mm stent to the RCA.   stress test in July 2011 showed mild ischemia in the inferior wall consistent with previous stress test   EKG shows normal sinus rhythm with rate 63 beats per minute, no significant ST or T wave changes. Recent lab work shows total cholesterol 137, LDL 53, HDL 37    Outpatient Encounter Prescriptions as of 11/16/2011  Medication Sig Dispense Refill  . amLODipine-benazepril (LOTREL) 10-20 MG per capsule Take 1 capsule by mouth daily.  90 capsule  3  . aspirin 81 MG EC tablet Take 81 mg by mouth daily.        Marland Kitchen atorvastatin (LIPITOR) 80 MG tablet Take 1 tablet (80 mg total) by mouth daily.  90 tablet  3  . clopidogrel (PLAVIX) 75 MG tablet Take 1 tablet (75 mg total) by  mouth daily.  90 tablet  3  . metoprolol tartrate (LOPRESSOR) 25 MG tablet Take 0.5 tablets (12.5 mg total) by mouth 2 (two) times daily.  90 tablet  3  . Omega-3 Fatty Acids (FISH OIL) 1200 MG CAPS Take 1,200 mg by mouth daily.      Marland Kitchen omeprazole (PRILOSEC) 20 MG capsule Take 1 capsule (20 mg total) by mouth daily.  30 capsule  6  . VITAMIN D, ERGOCALCIFEROL, PO Take 1 tablet by mouth daily.         Review of Systems  Constitutional: Negative.   HENT: Positive for mouth sores.   Eyes: Negative.   Respiratory: Negative.   Cardiovascular: Negative.   Gastrointestinal: Negative.   Musculoskeletal: Negative.   Skin: Negative.   Neurological: Negative.   Hematological: Negative.   Psychiatric/Behavioral: Negative.   All other systems reviewed and are negative.    BP 134/72  Pulse 63  Ht 5\' 11"  (1.803 m)  Wt 219 lb (99.338 kg)  BMI 30.54 kg/m2   Physical Exam  Nursing note and vitals reviewed. Constitutional: He is oriented to person, place, and time. He appears well-developed and well-nourished.  HENT:  Head: Normocephalic.  Nose: Nose normal.  Mouth/Throat: Oropharynx is clear and moist.  Eyes: Conjunctivae are normal. Pupils are equal, round, and reactive to light.  Neck:  Normal range of motion. Neck supple. No JVD present.  Cardiovascular: Normal rate, regular rhythm, S1 normal, S2 normal, normal heart sounds and intact distal pulses.  Exam reveals no gallop and no friction rub.   No murmur heard. Pulmonary/Chest: Effort normal and breath sounds normal. No respiratory distress. He has no wheezes. He has no rales. He exhibits no tenderness.  Abdominal: Soft. Bowel sounds are normal. He exhibits no distension. There is no tenderness.  Musculoskeletal: Normal range of motion. He exhibits no edema and no tenderness.  Lymphadenopathy:    He has no cervical adenopathy.  Neurological: He is alert and oriented to person, place, and time. Coordination normal.  Skin: Skin is warm  and dry. No rash noted. No erythema.  Psychiatric: He has a normal mood and affect. His behavior is normal. Judgment and thought content normal.           Assessment and Plan

## 2012-02-19 ENCOUNTER — Other Ambulatory Visit: Payer: Self-pay | Admitting: Cardiovascular Disease

## 2012-02-21 ENCOUNTER — Other Ambulatory Visit: Payer: Self-pay | Admitting: *Deleted

## 2012-02-21 MED ORDER — OMEPRAZOLE 20 MG PO CPDR: 20.0000 mg | DELAYED_RELEASE_CAPSULE | Freq: Every day | ORAL | Status: DC

## 2012-02-21 NOTE — Telephone Encounter (Signed)
Refilled Omeprazole 

## 2012-02-23 ENCOUNTER — Other Ambulatory Visit: Payer: Self-pay | Admitting: Cardiovascular Disease

## 2012-02-23 MED ORDER — ATORVASTATIN CALCIUM 80 MG PO TABS: 80.0000 mg | ORAL_TABLET | Freq: Every day | ORAL | Status: DC

## 2012-02-23 NOTE — Telephone Encounter (Signed)
Refilled Lipitor

## 2012-02-23 NOTE — Telephone Encounter (Signed)
90 day script.

## 2012-03-06 ENCOUNTER — Other Ambulatory Visit: Payer: Self-pay | Admitting: Cardiovascular Disease

## 2012-03-06 NOTE — Telephone Encounter (Signed)
Refilled metoprolol 

## 2012-04-12 ENCOUNTER — Ambulatory Visit: Payer: Self-pay

## 2012-04-30 ENCOUNTER — Ambulatory Visit: Payer: Self-pay

## 2012-05-02 ENCOUNTER — Ambulatory Visit (INDEPENDENT_AMBULATORY_CARE_PROVIDER_SITE_OTHER): Payer: Medicare Other | Admitting: Cardiovascular Disease

## 2012-05-02 ENCOUNTER — Encounter: Payer: Self-pay | Admitting: Cardiovascular Disease

## 2012-05-02 VITALS — BP 116/64 | HR 67 | Ht 71.0 in | Wt 217.8 lb

## 2012-05-02 DIAGNOSIS — E782 Mixed hyperlipidemia: Secondary | ICD-10-CM

## 2012-05-02 DIAGNOSIS — I2510 Atherosclerotic heart disease of native coronary artery without angina pectoris: Secondary | ICD-10-CM

## 2012-05-02 DIAGNOSIS — I739 Peripheral vascular disease, unspecified: Secondary | ICD-10-CM

## 2012-05-02 DIAGNOSIS — R42 Dizziness and giddiness: Secondary | ICD-10-CM | POA: Insufficient documentation

## 2012-05-02 MED ORDER — GUAIFENESIN-CODEINE 100-10 MG/5ML PO SYRP: 5.0000 mL | ORAL_SOLUTION | Freq: Three times a day (TID) | ORAL | Status: DC | PRN

## 2012-05-02 NOTE — Assessment & Plan Note (Signed)
Currently with no symptoms of angina. No further workup at this time. Continue current medication regimen. 

## 2012-05-02 NOTE — Progress Notes (Signed)
Patient ID: NYMIR RINGLER, male    DOB: August 14, 1931, 78 y.o.   MRN: [id number]  HPI Comments: Mr. Baiz is a very pleasant 78 year old gentleman with a history of coronary artery disease, stent placed to his distal RCA in 2005, also stent placed to his mid LAD at the same time with residual 50% obtuse marginal disease with repeat stress test in October 2009 showing no ischemia who presents for routine followup.    no significant aortic disease, mild carotid plaquing with mixed irregular plaque estimated at 40% on the right,  feels well with no significant symptoms of chest pain. Continued significant stress in his life as his wife has dementia.   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 He did have a successful left total knee replacement.  No significant shortness of breath with exertion.  He does report developing episode of severe dizziness last week, approximately 5 days ago. He was driving at the time and had to pullover. Symptoms resolved without intervention. That night he developed upper respiratory infection with runny nose. Currently has severe cough and is unable to sleep at nighttime. Additional mild episode of dizziness several days later. The first episode also associated with watering from his left thigh, some neck and shoulder discomfort which was better the next day.  He has started to attend "lifestyle"classes for his diabetes. Weight is about the same as last clinic visit   Cardiac catheter September 2005 shows 90% mid LAD, 80% distal RCA, 50% OM1 at the mid vessel, Taxus stent 2.75 x 24 mm stent to his LAD, TAxus stent 2.5 x 16 mm stent to the RCA.   stress test in July 2011 showed mild ischemia in the inferior wall consistent with previous stress test   EKG shows normal sinus rhythm with rate 67 beats per minute,no significant ST or T wave changes  Recent lab work shows total cholesterol 172, LDL 76. This is higher than  previous lab work last year    Outpatient Encounter Prescriptions as of 05/02/2012  Medication Sig Dispense Refill  . amLODipine-benazepril (LOTREL) 10-20 MG per capsule Take 1 capsule by mouth daily.  90 capsule  3  . aspirin 81 MG EC tablet Take 81 mg by mouth daily.        Marland Kitchen atorvastatin (LIPITOR) 80 MG tablet Take 1 tablet (80 mg total) by mouth daily.  90 tablet  3  . clopidogrel (PLAVIX) 75 MG tablet Take 1 tablet (75 mg total) by mouth daily.  90 tablet  3  . metoprolol tartrate (LOPRESSOR) 25 MG tablet Take 0.5 tablets (12.5 mg total) by mouth 2 (two) times daily.  90 tablet  3  . Omega-3 Fatty Acids (FISH OIL) 1200 MG CAPS Take 1,200 mg by mouth daily.      Marland Kitchen VITAMIN D, ERGOCALCIFEROL, PO Take 1 tablet by mouth daily.         Review of Systems  Constitutional: Negative.   Eyes: Negative.   Respiratory: Negative.   Cardiovascular: Negative.   Gastrointestinal: Negative.   Musculoskeletal: Negative.   Skin: Negative.   Neurological: Positive for dizziness.  Hematological: Negative.   Psychiatric/Behavioral: Negative.   All other systems reviewed and are negative.    BP 116/64  Pulse 67  Ht 5\' 11"  (1.803 m)  Wt 217 lb 12 oz (98.771 kg)  BMI 30.37 kg/m2  Physical Exam  Nursing note and vitals reviewed. Constitutional: He is oriented  to person, place, and time. He appears well-developed and well-nourished.  HENT:  Head: Normocephalic.  Nose: Nose normal.  Mouth/Throat: Oropharynx is clear and moist.  Eyes: Conjunctivae normal are normal. Pupils are equal, round, and reactive to light.  Neck: Normal range of motion. Neck supple. No JVD present.  Cardiovascular: Normal rate, regular rhythm, S1 normal, S2 normal, normal heart sounds and intact distal pulses.  Exam reveals no gallop and no friction rub.   No murmur heard. Pulmonary/Chest: Effort normal and breath sounds normal. No respiratory distress. He has no wheezes. He has no rales. He exhibits no tenderness.   Abdominal: Soft. Bowel sounds are normal. He exhibits no distension. There is no tenderness.  Musculoskeletal: Normal range of motion. He exhibits no edema and no tenderness.  Lymphadenopathy:    He has no cervical adenopathy.  Neurological: He is alert and oriented to person, place, and time. Coordination normal.  Skin: Skin is warm and dry. No rash noted. No erythema.  Psychiatric: He has a normal mood and affect. His behavior is normal. Judgment and thought content normal.           Assessment and Plan

## 2012-05-02 NOTE — Assessment & Plan Note (Signed)
Cholesterol is running high. We have stressed to him to lose weight, watch his diet. Sugars also running high.

## 2012-05-02 NOTE — Patient Instructions (Addendum)
You are doing well. No medication changes were made.  Try the robitussin with codeine for your cough as needed  Please call the office if you have more dizzy spells  Please call us if you have new issues that need to be addressed before your next appt.  Your physician wants you to follow-up in: 6 months.  You will receive a reminder letter in the mail two months in advance. If you don't receive a letter, please call our office to schedule the follow-up appointment.

## 2012-05-02 NOTE — Assessment & Plan Note (Signed)
Mild carotid arterial disease.

## 2012-05-02 NOTE — Assessment & Plan Note (Signed)
2 episodes in the past week, the first was more severe. Uncertain if this is related to recent upper respiratory infection. He would like to wait until his URI has resolved. If symptoms persist, we will start workup including Holter or event monitor. Unable to exclude small TIA given watering of his eyes, headache.

## 2012-05-07 ENCOUNTER — Other Ambulatory Visit: Payer: Self-pay | Admitting: Cardiovascular Disease

## 2012-05-08 NOTE — Telephone Encounter (Signed)
Please review for refill.  

## 2012-05-18 ENCOUNTER — Ambulatory Visit: Payer: Medicare Other | Admitting: Cardiovascular Disease

## 2012-05-19 ENCOUNTER — Ambulatory Visit: Payer: Medicare Other | Admitting: Cardiovascular Disease

## 2012-06-20 ENCOUNTER — Encounter: Payer: Self-pay | Admitting: *Deleted

## 2012-06-23 ENCOUNTER — Ambulatory Visit: Payer: Self-pay

## 2012-07-04 ENCOUNTER — Ambulatory Visit: Payer: Self-pay | Admitting: General Practice

## 2012-07-04 LAB — URINALYSIS, COMPLETE
Bilirubin,UR: NEGATIVE
Glucose,UR: NEGATIVE mg/dL (ref 0–75)
Ketone: NEGATIVE
Leukocyte Esterase: NEGATIVE
Protein: NEGATIVE
RBC,UR: 2 /HPF (ref 0–5)
Squamous Epithelial: <1
WBC UR: 1 /HPF (ref 0–5)

## 2012-07-04 LAB — CBC
HCT: 45.2 % (ref 40.0–52.0)
HGB: 15.5 g/dL (ref 13.0–18.0)
MCHC: 34.3 g/dL (ref 32.0–36.0)
MCV: 90 fL (ref 80–100)
RBC: 5.02 10*6/uL (ref 4.40–5.90)
RDW: 13.8 % (ref 11.5–14.5)

## 2012-07-04 LAB — MRSA PCR SCREENING

## 2012-07-04 LAB — PROTIME-INR: INR: 1.2

## 2012-07-04 LAB — BASIC METABOLIC PANEL
Anion Gap: 5 — ABNORMAL LOW (ref 7–16)
Co2: 28 mmol/L (ref 21–32)
Creatinine: 0.99 mg/dL (ref 0.60–1.30)
EGFR (Non-African Amer.): >60
Glucose: 117 mg/dL — ABNORMAL HIGH (ref 65–99)
Osmolality: 280 (ref 275–301)
Potassium: 4.3 mmol/L (ref 3.5–5.1)

## 2012-07-04 LAB — APTT: Activated PTT: 30.5 secs (ref 23.6–35.9)

## 2012-07-04 LAB — SEDIMENTATION RATE: Erythrocyte Sed Rate: 1 mm/hr (ref 0–20)

## 2012-07-05 ENCOUNTER — Ambulatory Visit: Payer: Self-pay

## 2012-07-05 ENCOUNTER — Telehealth: Payer: Self-pay | Admitting: *Deleted

## 2012-07-05 NOTE — Telephone Encounter (Signed)
Acceptable risk. Okay to hold Plavix. Continue on aspirin through the surgery

## 2012-07-05 NOTE — Telephone Encounter (Signed)
Last stent 2005 Ok to hold plavix prior to knee surg? Cleared for surg?

## 2012-07-05 NOTE — Telephone Encounter (Signed)
Pt having knee replacement surgery and needs to know when to stop plavix prior to surgery. Procedure scheduled for 2/17 with Dr Ernest Pine

## 2012-07-06 LAB — URINE CULTURE

## 2012-07-06 NOTE — Telephone Encounter (Signed)
Pt informed Understanding verb Letter faxed to Dr. Ernest Pine

## 2012-07-17 ENCOUNTER — Inpatient Hospital Stay: Payer: Self-pay | Admitting: General Practice

## 2012-07-17 DIAGNOSIS — R079 Chest pain, unspecified: Secondary | ICD-10-CM

## 2012-07-17 LAB — CBC WITH DIFFERENTIAL/PLATELET
Basophil #: 0.1 10*3/uL (ref 0.0–0.1)
Basophil %: 0.3 %
Eosinophil #: 0 10*3/uL (ref 0.0–0.7)
HCT: 40.1 % (ref 40.0–52.0)
Lymphocyte %: 5.6 %
MCH: 30.7 pg (ref 26.0–34.0)
MCHC: 34.2 g/dL (ref 32.0–36.0)
Monocyte #: 1.3 x10 3/mm — ABNORMAL HIGH (ref 0.2–1.0)
Monocyte %: 5.9 %
Neutrophil #: 19.8 10*3/uL — ABNORMAL HIGH (ref 1.4–6.5)
Platelet: 168 10*3/uL (ref 150–440)
RBC: 4.46 10*6/uL (ref 4.40–5.90)

## 2012-07-17 LAB — TROPONIN I: Troponin-I: 0.02 ng/mL

## 2012-07-17 LAB — BASIC METABOLIC PANEL
BUN: 19 mg/dL — ABNORMAL HIGH (ref 7–18)
Calcium, Total: 8.3 mg/dL — ABNORMAL LOW (ref 8.5–10.1)
Chloride: 105 mmol/L (ref 98–107)
Co2: 25 mmol/L (ref 21–32)
Creatinine: 0.96 mg/dL (ref 0.60–1.30)
EGFR (African American): >60
EGFR (Non-African Amer.): >60
Osmolality: 285 (ref 275–301)
Potassium: 3.7 mmol/L (ref 3.5–5.1)
Sodium: 140 mmol/L (ref 136–145)

## 2012-07-18 DIAGNOSIS — R079 Chest pain, unspecified: Secondary | ICD-10-CM

## 2012-07-18 LAB — BASIC METABOLIC PANEL
Anion Gap: 7 (ref 7–16)
Calcium, Total: 8 mg/dL — ABNORMAL LOW (ref 8.5–10.1)
Co2: 26 mmol/L (ref 21–32)
Creatinine: 1.03 mg/dL (ref 0.60–1.30)
EGFR (Non-African Amer.): >60
Sodium: 136 mmol/L (ref 136–145)

## 2012-07-18 LAB — TROPONIN I
Troponin-I: 0.02 ng/mL
Troponin-I: 0.02 ng/mL

## 2012-07-18 LAB — PLATELET COUNT: Platelet: 153 10*3/uL (ref 150–440)

## 2012-07-19 LAB — BASIC METABOLIC PANEL WITH GFR
Anion Gap: 7 (ref 7–16)
BUN: 20 mg/dL — ABNORMAL HIGH (ref 7–18)
Calcium, Total: 8.3 mg/dL — ABNORMAL LOW (ref 8.5–10.1)
Chloride: 100 mmol/L (ref 98–107)
Co2: 27 mmol/L (ref 21–32)
Creatinine: 0.9 mg/dL (ref 0.60–1.30)
EGFR (African American): >60
EGFR (Non-African Amer.): >60
Glucose: 145 mg/dL — ABNORMAL HIGH (ref 65–99)
Osmolality: 273 (ref 275–301)
Potassium: 4.1 mmol/L (ref 3.5–5.1)
Sodium: 134 mmol/L — ABNORMAL LOW (ref 136–145)

## 2012-07-25 ENCOUNTER — Ambulatory Visit: Payer: Self-pay

## 2012-07-25 ENCOUNTER — Inpatient Hospital Stay: Payer: Self-pay

## 2012-07-25 LAB — CBC WITH DIFFERENTIAL/PLATELET
Basophil %: 0.3 %
Eosinophil #: 0.1 10*3/uL (ref 0.0–0.7)
Eosinophil %: 1.7 %
Lymphocyte #: 0.4 10*3/uL — ABNORMAL LOW (ref 1.0–3.6)
Lymphocyte %: 7.4 %
MCH: 31.1 pg (ref 26.0–34.0)
MCV: 89 fL (ref 80–100)
Monocyte %: 6.6 %
Neutrophil #: 5.1 10*3/uL (ref 1.4–6.5)
Neutrophil %: 84 %
Platelet: 225 10*3/uL (ref 150–440)
RBC: 3.66 10*6/uL — ABNORMAL LOW (ref 4.40–5.90)
RDW: 14.1 % (ref 11.5–14.5)
WBC: 6 10*3/uL (ref 3.8–10.6)

## 2012-07-25 LAB — LIPASE, BLOOD: Lipase: 269 U/L (ref 73–393)

## 2012-07-25 LAB — URINALYSIS, COMPLETE: Bacteria: NEGATIVE

## 2012-07-25 LAB — COMPREHENSIVE METABOLIC PANEL
Albumin: 3.1 g/dL — ABNORMAL LOW (ref 3.4–5.0)
Anion Gap: 12 (ref 7–16)
BUN: 25 mg/dL — ABNORMAL HIGH (ref 7–18)
Bilirubin,Total: 9.7 mg/dL — ABNORMAL HIGH (ref 0.2–1.0)
Chloride: 99 mmol/L (ref 98–107)
EGFR (Non-African Amer.): >60
Potassium: 4.2 mmol/L (ref 3.5–5.1)
SGOT(AST): 108 U/L — ABNORMAL HIGH (ref 15–37)
Total Protein: 6.9 g/dL (ref 6.4–8.2)

## 2012-07-25 LAB — LACTATE DEHYDROGENASE: LDH: 201 U/L (ref 85–241)

## 2012-07-25 LAB — AMYLASE: Amylase: 23 U/L — ABNORMAL LOW (ref 25–115)

## 2012-07-26 LAB — COMPREHENSIVE METABOLIC PANEL
Alkaline Phosphatase: 272 U/L — ABNORMAL HIGH (ref 50–136)
Anion Gap: 9 (ref 7–16)
BUN: 26 mg/dL — ABNORMAL HIGH (ref 7–18)
Co2: 21 mmol/L (ref 21–32)
Creatinine: 0.84 mg/dL (ref 0.60–1.30)
EGFR (African American): >60
EGFR (Non-African Amer.): >60
Osmolality: 280 (ref 275–301)
Potassium: 3.6 mmol/L (ref 3.5–5.1)
SGOT(AST): 90 U/L — ABNORMAL HIGH (ref 15–37)
SGPT (ALT): 129 U/L — ABNORMAL HIGH (ref 12–78)
Sodium: 137 mmol/L (ref 136–145)
Total Protein: 6.2 g/dL — ABNORMAL LOW (ref 6.4–8.2)

## 2012-07-26 LAB — BILIRUBIN, DIRECT: Bilirubin, Direct: 5.5 mg/dL — ABNORMAL HIGH (ref 0.00–0.20)

## 2012-07-26 LAB — CBC WITH DIFFERENTIAL/PLATELET
Basophil #: 0 10*3/uL (ref 0.0–0.1)
Basophil %: 0.4 %
Eosinophil %: 4 %
HCT: 30.1 % — ABNORMAL LOW (ref 40.0–52.0)
HGB: 10.3 g/dL — ABNORMAL LOW (ref 13.0–18.0)
Lymphocyte #: 0.6 10*3/uL — ABNORMAL LOW (ref 1.0–3.6)
MCHC: 34.3 g/dL (ref 32.0–36.0)
Monocyte #: 0.4 x10 3/mm (ref 0.2–1.0)
Monocyte %: 9 %
Neutrophil %: 73.4 %
Platelet: 207 10*3/uL (ref 150–440)
RBC: 3.35 10*6/uL — ABNORMAL LOW (ref 4.40–5.90)

## 2012-07-26 LAB — LIPID PANEL
HDL Cholesterol: 3 mg/dL — ABNORMAL LOW (ref 40–60)
Ldl Cholesterol, Calc: 32 mg/dL (ref 0–100)
Triglycerides: 260 mg/dL — ABNORMAL HIGH (ref 0–200)

## 2012-07-27 LAB — COMPREHENSIVE METABOLIC PANEL
Alkaline Phosphatase: 311 U/L — ABNORMAL HIGH (ref 50–136)
Anion Gap: 8 (ref 7–16)
BUN: 14 mg/dL (ref 7–18)
Bilirubin,Total: 4.1 mg/dL — ABNORMAL HIGH (ref 0.2–1.0)
Calcium, Total: 8.3 mg/dL — ABNORMAL LOW (ref 8.5–10.1)
Chloride: 107 mmol/L (ref 98–107)
Co2: 25 mmol/L (ref 21–32)
Creatinine: 0.99 mg/dL (ref 0.60–1.30)
EGFR (African American): >60
SGOT(AST): 73 U/L — ABNORMAL HIGH (ref 15–37)
SGPT (ALT): 116 U/L — ABNORMAL HIGH (ref 12–78)
Sodium: 140 mmol/L (ref 136–145)
Total Protein: 6.4 g/dL (ref 6.4–8.2)

## 2012-10-08 ENCOUNTER — Other Ambulatory Visit: Payer: Self-pay | Admitting: Cardiovascular Disease

## 2012-10-14 ENCOUNTER — Other Ambulatory Visit: Payer: Self-pay | Admitting: Cardiovascular Disease

## 2012-10-16 ENCOUNTER — Other Ambulatory Visit: Payer: Self-pay | Admitting: *Deleted

## 2012-10-16 MED ORDER — OMEPRAZOLE 20 MG PO CPDR: DELAYED_RELEASE_CAPSULE | ORAL | Status: DC

## 2012-10-16 NOTE — Telephone Encounter (Signed)
Refilled Omeprazole sent to [facility].

## 2012-11-02 ENCOUNTER — Encounter: Payer: Self-pay | Admitting: Cardiovascular Disease

## 2012-11-02 ENCOUNTER — Ambulatory Visit (INDEPENDENT_AMBULATORY_CARE_PROVIDER_SITE_OTHER): Payer: Medicare Other | Admitting: Cardiovascular Disease

## 2012-11-02 VITALS — BP 122/72 | HR 63 | Ht 70.5 in | Wt 215.2 lb

## 2012-11-02 DIAGNOSIS — I739 Peripheral vascular disease, unspecified: Secondary | ICD-10-CM

## 2012-11-02 DIAGNOSIS — E782 Mixed hyperlipidemia: Secondary | ICD-10-CM

## 2012-11-02 DIAGNOSIS — I2510 Atherosclerotic heart disease of native coronary artery without angina pectoris: Secondary | ICD-10-CM

## 2012-11-02 DIAGNOSIS — K759 Inflammatory liver disease, unspecified: Secondary | ICD-10-CM

## 2012-11-02 DIAGNOSIS — R42 Dizziness and giddiness: Secondary | ICD-10-CM

## 2012-11-02 DIAGNOSIS — T466X5A Adverse effect of antihyperlipidemic and antiarteriosclerotic drugs, initial encounter: Secondary | ICD-10-CM | POA: Insufficient documentation

## 2012-11-02 DIAGNOSIS — K719 Toxic liver disease, unspecified: Secondary | ICD-10-CM | POA: Insufficient documentation

## 2012-11-02 MED ORDER — ATORVASTATIN CALCIUM 20 MG PO TABS: 20.0000 mg | ORAL_TABLET | Freq: Every day | ORAL | Status: DC

## 2012-11-02 MED ORDER — OMEPRAZOLE 20 MG PO CPDR: 20.0000 mg | DELAYED_RELEASE_CAPSULE | Freq: Two times a day (BID) | ORAL | Status: DC

## 2012-11-02 NOTE — Assessment & Plan Note (Signed)
Mild carotid arterial disease. We'll need to monitor this every several years

## 2012-11-02 NOTE — Assessment & Plan Note (Signed)
Dizziness episodes have resolved

## 2012-11-02 NOTE — Assessment & Plan Note (Signed)
Currently with no symptoms of angina. No further workup at this time. Continue current medication regimen. 

## 2012-11-02 NOTE — Progress Notes (Signed)
Patient ID: Christopher [name], male    DOB: 1931/10/17, 79 y.o.   MRN: [id number]  HPI Comments: Christopher [name] is a very pleasant 79 year old gentleman with a history of coronary artery disease, stent placed to his distal RCA in 2005, also stent placed to his mid LAD at the same time with residual 50% obtuse marginal disease with repeat stress test in October 2009 showing no ischemia who presents for routine followup.   no significant aortic disease, mild carotid plaquing with mixed irregular plaque estimated at 40% on the right,  Continued significant stress in his life as his wife has dementia.    He reports having recent total knee replacement with rehabilitation at [facility]. At the rehabilitation, he was receiving atorvastatin 80 mg daily with Vytorin (dose unclear). He became very ill, had chills, riders, sweating, urine was very dark. He was noticed to be jaundice and checked himself out of rehabilitation and went to the hospital where by his report he had a period of hepatitis. Symptoms resolved by holding the statin's. LFTs have now improved back to normal and he feels well. He has not restarted any cholesterol medication. Again his major stress now is his wife. He has a home agency helping to take care of her  Recent blood work 03/24/2013 shows total cholesterol 221, LDL 133, HDL 39, normal LFTs  Prior episode of severe dizziness. He was driving at the time and had to pullover. Symptoms resolved without intervention. That night he developed upper respiratory infection with runny nose. Currently has severe cough and  unable to sleep. Additional mild episode of dizziness several days later. No further episodes since then  Cardiac catheter September 2005 shows 90% mid LAD, 80% distal RCA, 50% OM1 at the mid vessel, Taxus stent 2.75 x 24 mm stent to his LAD, TAxus stent 2.5 x 16 mm stent to the RCA.   stress test in July 2011 showed mild ischemia in the inferior wall consistent with previous  stress test   EKG shows normal sinus rhythm with rate 63 beats per minute,no significant ST or T wave changes  Previous lab work on Lipitor 80 mg daily: total cholesterol 172, LDL 76    Outpatient Encounter Prescriptions as of 11/02/2012  Medication Sig Dispense Refill  . amLODipine-benazepril (LOTREL) 10-20 MG per capsule Take 1 capsule by mouth daily.  90 capsule  3  . aspirin 81 MG EC tablet Take 81 mg by mouth daily.        . clopidogrel (PLAVIX) 75 MG tablet Take 1 tablet (75 mg total) by mouth daily.  90 tablet  3  . guaiFENesin-codeine (ROBITUSSIN AC) 100-10 MG/5ML syrup Take 5 mLs by mouth 3 (three) times daily as needed for cough.  120 mL  1  . metoprolol tartrate (LOPRESSOR) 25 MG tablet Take 0.5 tablets (12.5 mg total) by mouth 2 (two) times daily.  90 tablet  3  . Omega-3 Fatty Acids (FISH OIL) 1200 MG CAPS Take 1,200 mg by mouth daily.      Marland Kitchen omeprazole (PRILOSEC) 20 MG capsule Take 1 capsule (20 mg total) by mouth 2 (two) times daily.  180 capsule  3  . VITAMIN D, ERGOCALCIFEROL, PO Take 1 tablet by mouth daily.       . [DISCONTINUED] omeprazole (PRILOSEC) 20 MG capsule TAKE 1 CAPSULE (20 MG TOTAL) BY MOUTH DAILY.  30 capsule  6  . atorvastatin (LIPITOR) 20 MG tablet Take 1 tablet (20 mg total) by mouth daily.  has not started yet   90 tablet  3    Review of Systems  Constitutional: Negative.   Eyes: Negative.   Respiratory: Negative.   Cardiovascular: Negative.   Gastrointestinal: Negative.   Musculoskeletal: Negative.   Skin: Negative.   Psychiatric/Behavioral: Negative.   All other systems reviewed and are negative.    BP 122/72  Pulse 63  Ht 5' 10.5" (1.791 m)  Wt 215 lb 4 oz (97.637 kg)  BMI 30.44 kg/m2  Physical Exam  Nursing note and vitals reviewed. Constitutional: He is oriented to person, place, and time. He appears well-developed and well-nourished.  HENT:  Head: Normocephalic.  Nose: Nose normal.  Mouth/Throat: Oropharynx is clear and moist.   Eyes: Conjunctivae are normal. Pupils are equal, round, and reactive to light.  Neck: Normal range of motion. Neck supple. No JVD present.  Cardiovascular: Normal rate, regular rhythm, S1 normal, S2 normal, normal heart sounds and intact distal pulses.  Exam reveals no gallop and no friction rub.   No murmur heard. Pulmonary/Chest: Effort normal and breath sounds normal. No respiratory distress. He has no wheezes. He has no rales. He exhibits no tenderness.  Abdominal: Soft. Bowel sounds are normal. He exhibits no distension. There is no tenderness.  Musculoskeletal: Normal range of motion. He exhibits no edema and no tenderness.  Lymphadenopathy:    He has no cervical adenopathy.  Neurological: He is alert and oriented to person, place, and time. Coordination normal.  Skin: Skin is warm and dry. No rash noted. No erythema.  Psychiatric: He has a normal mood and affect. His behavior is normal. Judgment and thought content normal.      Assessment and Plan

## 2012-11-02 NOTE — Assessment & Plan Note (Signed)
Encouraged him to restart low-dose generic Lipitor 20 mg daily with frequent check of his LFTs. He has not started the medication yet and reports he is scheduled to have lab work done in several weeks' time.

## 2012-11-02 NOTE — Patient Instructions (Addendum)
You are doing well. No medication changes were made.  Please call us if you have new issues that need to be addressed before your next appt.  Your physician wants you to follow-up in: 6 months.  You will receive a reminder letter in the mail two months in advance. If you don't receive a letter, please call our office to schedule the follow-up appointment.   

## 2012-11-02 NOTE — Assessment & Plan Note (Signed)
Recent hepatitis. LFTs back to normal. He has not restarted a statin yet. We have suggested he restart generic Lipitor 20 mg daily with check of his LFTs in several weeks' time. This has been scheduled by Dr. Sampson Goon.

## 2013-01-29 ENCOUNTER — Other Ambulatory Visit: Payer: Self-pay | Admitting: Cardiovascular Disease

## 2013-01-30 ENCOUNTER — Other Ambulatory Visit: Payer: Self-pay | Admitting: *Deleted

## 2013-01-30 MED ORDER — AMLODIPINE BESY-BENAZEPRIL HCL 10-20 MG PO CAPS: 1.0000 | ORAL_CAPSULE | Freq: Every day | ORAL | Status: DC

## 2013-01-30 NOTE — Telephone Encounter (Signed)
Refilled [facility]. 

## 2013-02-27 ENCOUNTER — Other Ambulatory Visit: Payer: Self-pay | Admitting: Cardiovascular Disease

## 2013-02-27 NOTE — Telephone Encounter (Signed)
[facility]illed [facility]rel [facility]. 

## 2013-03-19 ENCOUNTER — Encounter: Payer: Self-pay | Admitting: Podiatry

## 2013-03-19 ENCOUNTER — Ambulatory Visit (INDEPENDENT_AMBULATORY_CARE_PROVIDER_SITE_OTHER): Payer: Medicare Other | Admitting: Podiatry

## 2013-03-19 VITALS — BP 118/69 | HR 61 | Resp 18 | Ht 71.0 in | Wt 219.0 lb

## 2013-03-19 DIAGNOSIS — M722 Plantar fascial fibromatosis: Secondary | ICD-10-CM

## 2013-03-19 NOTE — Progress Notes (Signed)
Shawnn presents today for followup of plantar fasciitis right foot states that he is 100% better. He hasn't had pain since 3 days after we injected him.  Objective: Vital signs stable he is alert and oriented x3. I reviewed his past medical history medications and allergies. Has no pain on palpation he continued tubercle the right heel. His pulses remain palpable right lower extremity.  Assessment: Well-healing plantar fasciitis right.  Plan: Continue all conservative therapies followup with me should this recur.

## 2013-05-11 ENCOUNTER — Encounter: Payer: Self-pay | Admitting: Cardiovascular Disease

## 2013-05-11 ENCOUNTER — Ambulatory Visit (INDEPENDENT_AMBULATORY_CARE_PROVIDER_SITE_OTHER): Payer: Medicare Other | Admitting: Cardiovascular Disease

## 2013-05-11 VITALS — BP 140/80 | HR 63 | Ht 71.0 in | Wt 219.5 lb

## 2013-05-11 DIAGNOSIS — I2510 Atherosclerotic heart disease of native coronary artery without angina pectoris: Secondary | ICD-10-CM

## 2013-05-11 DIAGNOSIS — I739 Peripheral vascular disease, unspecified: Secondary | ICD-10-CM

## 2013-05-11 DIAGNOSIS — E782 Mixed hyperlipidemia: Secondary | ICD-10-CM

## 2013-05-11 NOTE — Assessment & Plan Note (Signed)
Currently with no symptoms of angina. No further workup at this time. Continue current medication regimen. 

## 2013-05-11 NOTE — Progress Notes (Signed)
Patient ID: Christopher [name], male    DOB: 21-Mar-1932, 79 y.o.   MRN: [id number]  HPI Comments: Christopher [name] is a very pleasant 79 year old gentleman with a history of coronary artery disease, stent placed to his distal RCA in 2005, also stent placed to his mid LAD at the same time with residual 50% obtuse marginal disease with repeat stress test in October 2009 showing no ischemia who presents for routine followup.   no significant aortic disease, mild carotid plaquing with mixed irregular plaque estimated at 40% on the right,  Continued significant stress in his life as his wife has dementia.    History of total knee replacement with rehabilitation at [facility]. At the rehabilitation, he was receiving atorvastatin 80 mg daily with Vytorin (dose unclear). He became very ill, had chills, rigors, sweating, urine was very dark. He was noticed to be jaundice and checked himself out of rehabilitation and went to the hospital, noted to have hepatitis. Symptoms resolved by holding the statin's. LFTs improved back to normal   Lipitor was restarted at low dose 20 mg on his previous clinic visit  major stress now continues to be his wife. He has a home agency helping to take care of her. He is debating whether to place her in a home   blood work 03/24/2013 shows total cholesterol 221, LDL 133, HDL 39, normal LFTs without statin  Prior episode of severe dizziness. He was driving at the time and had to pullover. Symptoms resolved without intervention. That night he developed upper respiratory infection with runny nose. Currently has severe cough and  unable to sleep. Additional mild episode of dizziness several days later. No further episodes since then  Cardiac catheter September 2005 shows 90% mid LAD, 80% distal RCA, 50% OM1 at the mid vessel, Taxus stent 2.75 x 24 mm stent to his LAD, TAxus stent 2.5 x 16 mm stent to the RCA.   stress test in July 2011 showed mild ischemia in the inferior wall consistent  with previous stress test   EKG shows normal sinus rhythm with rate 63 beats per minute,no significant ST or T wave changes  Previous lab work on Lipitor 80 mg daily: total cholesterol 172, LDL 76   Outpatient Encounter Prescriptions as of 05/11/2013  Medication Sig  . amLODipine-benazepril (LOTREL) 10-20 MG per capsule Take 1 capsule by mouth daily.  Marland Kitchen aspirin 81 MG EC tablet Take 81 mg by mouth daily.    Marland Kitchen atorvastatin (LIPITOR) 20 MG tablet Take 20 mg by mouth daily.  . clopidogrel (PLAVIX) 75 MG tablet TAKE 1 TABLET BY MOUTH EVERY DAY  . guaiFENesin-codeine (ROBITUSSIN AC) 100-10 MG/5ML syrup Take 5 mLs by mouth 3 (three) times daily as needed for cough.  . meloxicam (MOBIC) 15 MG tablet Take 15 mg by mouth daily.  . metoprolol tartrate (LOPRESSOR) 25 MG tablet Take 0.5 tablets (12.5 mg total) by mouth 2 (two) times daily.  . Omega-3 Fatty Acids (FISH OIL) 1200 MG CAPS Take 1,200 mg by mouth daily.  Marland Kitchen omeprazole (PRILOSEC) 20 MG capsule Take 1 capsule (20 mg total) by mouth 2 (two) times daily.   Review of Systems  Constitutional: Negative.   Eyes: Negative.   Respiratory: Negative.   Cardiovascular: Negative.   Gastrointestinal: Negative.   Musculoskeletal: Negative.   Skin: Negative.   Psychiatric/Behavioral: Negative.   All other systems reviewed and are negative.    BP 140/80  Pulse 63  Ht 5\' 11"  (1.803 m)  Wt  219 lb 8 oz (99.565 kg)  BMI 30.63 kg/m2  Physical Exam  Nursing note and vitals reviewed. Constitutional: He is oriented to person, place, and time. He appears well-developed and well-nourished.  HENT:  Head: Normocephalic.  Nose: Nose normal.  Mouth/Throat: Oropharynx is clear and moist.  Eyes: Conjunctivae are normal. Pupils are equal, round, and reactive to light.  Neck: Normal range of motion. Neck supple. No JVD present.  Cardiovascular: Normal rate, regular rhythm, S1 normal, S2 normal, normal heart sounds and intact distal pulses.  Exam reveals no  gallop and no friction rub.   No murmur heard. Pulmonary/Chest: Effort normal and breath sounds normal. No respiratory distress. He has no wheezes. He has no rales. He exhibits no tenderness.  Abdominal: Soft. Bowel sounds are normal. He exhibits no distension. There is no tenderness.  Musculoskeletal: Normal range of motion. He exhibits no edema and no tenderness.  Lymphadenopathy:    He has no cervical adenopathy.  Neurological: He is alert and oriented to person, place, and time. Coordination normal.  Skin: Skin is warm and dry. No rash noted. No erythema.  Psychiatric: He has a normal mood and affect. His behavior is normal. Judgment and thought content normal.      Assessment and Plan

## 2013-05-11 NOTE — Assessment & Plan Note (Signed)
Previous ultrasound with mild carotid plaquing. Continue to work on aggressive cholesterol control 

## 2013-05-11 NOTE — Assessment & Plan Note (Signed)
He reports that he has followup with primary care next month and will have full panel lab work at that time. Could consider increasing his statin if LFTs are normal. Suspect his cholesterol will be above goal. Could add zetia 10 mg daily as well

## 2013-05-11 NOTE — Patient Instructions (Signed)
You are doing well. No medication changes were made.  Please call us if you have new issues that need to be addressed before your next appt.  Your physician wants you to follow-up in: 6 months.  You will receive a reminder letter in the mail two months in advance. If you don't receive a letter, please call our office to schedule the follow-up appointment.   

## 2013-05-30 ENCOUNTER — Emergency Department: Payer: Self-pay | Admitting: Emergency Medicine

## 2013-05-30 LAB — HEMOGLOBIN A1C: Hemoglobin A1C: 6.5 % — ABNORMAL HIGH (ref 4.2–6.3)

## 2013-05-30 LAB — CBC WITH DIFFERENTIAL/PLATELET
Eosinophil #: 0 10*3/uL (ref 0.0–0.7)
Eosinophil %: 0 %
HCT: 43.4 % (ref 40.0–52.0)
Lymphocyte #: 1 10*3/uL (ref 1.0–3.6)
MCH: 31.4 pg (ref 26.0–34.0)
MCHC: 34.6 g/dL (ref 32.0–36.0)
Monocyte %: 3 %
Neutrophil %: 89.5 %
RBC: 4.77 10*6/uL (ref 4.40–5.90)
RDW: 13.2 % (ref 11.5–14.5)

## 2013-05-30 LAB — URINALYSIS, COMPLETE
Leukocyte Esterase: NEGATIVE
Ph: 5 (ref 4.5–8.0)
Protein: NEGATIVE
RBC,UR: 1 /HPF (ref 0–5)
Specific Gravity: 1.027 (ref 1.003–1.030)
Squamous Epithelial: <1

## 2013-05-30 LAB — COMPREHENSIVE METABOLIC PANEL
Albumin: 4.3 g/dL (ref 3.4–5.0)
Anion Gap: 8 (ref 7–16)
Bilirubin,Total: 0.6 mg/dL (ref 0.2–1.0)
Calcium, Total: 8.7 mg/dL (ref 8.5–10.1)
Chloride: 102 mmol/L (ref 98–107)
Creatinine: 0.98 mg/dL (ref 0.60–1.30)
Glucose: 236 mg/dL — ABNORMAL HIGH (ref 65–99)
Osmolality: 281 (ref 275–301)
SGOT(AST): 12 U/L — ABNORMAL LOW (ref 15–37)
SGPT (ALT): 29 U/L (ref 12–78)
Sodium: 135 mmol/L — ABNORMAL LOW (ref 136–145)

## 2013-06-08 DIAGNOSIS — I10 Essential (primary) hypertension: Secondary | ICD-10-CM | POA: Diagnosis not present

## 2013-06-08 DIAGNOSIS — E041 Nontoxic single thyroid nodule: Secondary | ICD-10-CM | POA: Diagnosis not present

## 2013-06-08 DIAGNOSIS — D72829 Elevated white blood cell count, unspecified: Secondary | ICD-10-CM | POA: Diagnosis not present

## 2013-06-08 DIAGNOSIS — M549 Dorsalgia, unspecified: Secondary | ICD-10-CM | POA: Diagnosis not present

## 2013-06-21 DIAGNOSIS — E039 Hypothyroidism, unspecified: Secondary | ICD-10-CM | POA: Diagnosis not present

## 2013-06-26 DIAGNOSIS — E041 Nontoxic single thyroid nodule: Secondary | ICD-10-CM | POA: Diagnosis not present

## 2013-07-02 DIAGNOSIS — E0520 Thyrotoxicosis with toxic multinodular goiter without thyrotoxic crisis or storm: Secondary | ICD-10-CM | POA: Diagnosis not present

## 2013-07-05 ENCOUNTER — Encounter: Payer: Self-pay | Admitting: Podiatry

## 2013-07-05 ENCOUNTER — Ambulatory Visit (INDEPENDENT_AMBULATORY_CARE_PROVIDER_SITE_OTHER): Payer: Medicare Other

## 2013-07-05 ENCOUNTER — Ambulatory Visit (INDEPENDENT_AMBULATORY_CARE_PROVIDER_SITE_OTHER): Payer: Medicare Other | Admitting: Podiatry

## 2013-07-05 ENCOUNTER — Ambulatory Visit: Payer: Self-pay

## 2013-07-05 VITALS — BP 145/78 | HR 60 | Resp 16 | Ht 70.5 in | Wt 214.0 lb

## 2013-07-05 DIAGNOSIS — M79609 Pain in unspecified limb: Secondary | ICD-10-CM

## 2013-07-05 DIAGNOSIS — M79673 Pain in unspecified foot: Secondary | ICD-10-CM

## 2013-07-05 DIAGNOSIS — M722 Plantar fascial fibromatosis: Secondary | ICD-10-CM | POA: Diagnosis not present

## 2013-07-05 MED ORDER — METHYLPREDNISOLONE (PAK) 4 MG PO TABS: ORAL_TABLET | ORAL | Status: DC

## 2013-07-05 NOTE — Patient Instructions (Signed)
Plantar Fasciitis (Heel Spur Syndrome) with Rehab The plantar fascia is a fibrous, ligament-like, soft-tissue structure that spans the bottom of the foot. Plantar fasciitis is a condition that causes pain in the foot due to inflammation of the tissue. SYMPTOMS   Pain and tenderness on the underneath side of the foot.  Pain that worsens with standing or walking. CAUSES  Plantar fasciitis is caused by irritation and injury to the plantar fascia on the underneath side of the foot. Common mechanisms of injury include:  Direct trauma to bottom of the foot.  Damage to a small nerve that runs under the foot where the main fascia attaches to the heel bone.  Stress placed on the plantar fascia due to bone spurs. RISK INCREASES WITH:   Activities that place stress on the plantar fascia (running, jumping, pivoting, or cutting).  Poor strength and flexibility.  Improperly fitted shoes.  Tight calf muscles.  Flat feet.  Failure to warm-up properly before activity.  Obesity. PREVENTION  Warm up and stretch properly before activity.  Allow for adequate recovery between workouts.  Maintain physical fitness:  Strength, flexibility, and endurance.  Cardiovascular fitness.  Maintain a health body weight.  Avoid stress on the plantar fascia.  Wear properly fitted shoes, including arch supports for individuals who have flat feet. PROGNOSIS  If treated properly, then the symptoms of plantar fasciitis usually resolve without surgery. However, occasionally surgery is necessary. RELATED COMPLICATIONS   Recurrent symptoms that may result in a chronic condition.  Problems of the lower back that are caused by compensating for the injury, such as limping.  Pain or weakness of the foot during push-off following surgery.  Chronic inflammation, scarring, and partial or complete fascia tear, occurring more often from repeated injections. TREATMENT  Treatment initially involves the use of  ice and medication to help reduce pain and inflammation. The use of strengthening and stretching exercises may help reduce pain with activity, especially stretches of the Achilles tendon. These exercises may be performed at home or with a therapist. Your caregiver may recommend that you use heel cups of arch supports to help reduce stress on the plantar fascia. Occasionally, corticosteroid injections are given to reduce inflammation. If symptoms persist for greater than 6 months despite non-surgical (conservative), then surgery may be recommended.  MEDICATION   If pain medication is necessary, then nonsteroidal anti-inflammatory medications, such as aspirin and ibuprofen, or other minor pain relievers, such as acetaminophen, are often recommended.  Do not take pain medication within 7 days before surgery.  Prescription pain relievers may be given if deemed necessary by your caregiver. Use only as directed and only as much as you need.  Corticosteroid injections may be given by your caregiver. These injections should be reserved for the most serious cases, because they may only be given a certain number of times. HEAT AND COLD  Cold treatment (icing) relieves pain and reduces inflammation. Cold treatment should be applied for 10 to 15 minutes every 2 to 3 hours for inflammation and pain and immediately after any activity that aggravates your symptoms. Use ice packs or massage the area with a piece of ice (ice massage).  Heat treatment may be used prior to performing the stretching and strengthening activities prescribed by your caregiver, physical therapist, or athletic trainer. Use a heat pack or soak the injury in warm water. SEEK IMMEDIATE MEDICAL CARE IF:  Treatment seems to offer no benefit, or the condition worsens.  Any medications produce adverse side effects. EXERCISES RANGE   OF MOTION (ROM) AND STRETCHING EXERCISES - Plantar Fasciitis (Heel Spur Syndrome) These exercises may help you  when beginning to rehabilitate your injury. Your symptoms may resolve with or without further involvement from your physician, physical therapist or athletic trainer. While completing these exercises, remember:   Restoring tissue flexibility helps normal motion to return to the joints. This allows healthier, less painful movement and activity.  An effective stretch should be held for at least 30 seconds.  A stretch should never be painful. You should only feel a gentle lengthening or release in the stretched tissue. RANGE OF MOTION - Toe Extension, Flexion  Sit with your right / left leg crossed over your opposite knee.  Grasp your toes and gently pull them back toward the top of your foot. You should feel a stretch on the bottom of your toes and/or foot.  Hold this stretch for __________ seconds.  Now, gently pull your toes toward the bottom of your foot. You should feel a stretch on the top of your toes and or foot.  Hold this stretch for __________ seconds. Repeat __________ times. Complete this stretch __________ times per day.  RANGE OF MOTION - Ankle Dorsiflexion, Active Assisted  Remove shoes and sit on a chair that is preferably not on a carpeted surface.  Place right / left foot under knee. Extend your opposite leg for support.  Keeping your heel down, slide your right / left foot back toward the chair until you feel a stretch at your ankle or calf. If you do not feel a stretch, slide your bottom forward to the edge of the chair, while still keeping your heel down.  Hold this stretch for __________ seconds. Repeat __________ times. Complete this stretch __________ times per day.  STRETCH  Gastroc, Standing  Place hands on wall.  Extend right / left leg, keeping the front knee somewhat bent.  Slightly point your toes inward on your back foot.  Keeping your right / left heel on the floor and your knee straight, shift your weight toward the wall, not allowing your back to  arch.  You should feel a gentle stretch in the right / left calf. Hold this position for __________ seconds. Repeat __________ times. Complete this stretch __________ times per day. STRETCH  Soleus, Standing  Place hands on wall.  Extend right / left leg, keeping the other knee somewhat bent.  Slightly point your toes inward on your back foot.  Keep your right / left heel on the floor, bend your back knee, and slightly shift your weight over the back leg so that you feel a gentle stretch deep in your back calf.  Hold this position for __________ seconds. Repeat __________ times. Complete this stretch __________ times per day. STRETCH  Gastrocsoleus, Standing  Note: This exercise can place a lot of stress on your foot and ankle. Please complete this exercise only if specifically instructed by your caregiver.   Place the ball of your right / left foot on a step, keeping your other foot firmly on the same step.  Hold on to the wall or a rail for balance.  Slowly lift your other foot, allowing your body weight to press your heel down over the edge of the step.  You should feel a stretch in your right / left calf.  Hold this position for __________ seconds.  Repeat this exercise with a slight bend in your right / left knee. Repeat __________ times. Complete this stretch __________ times per day.    STRENGTHENING EXERCISES - Plantar Fasciitis (Heel Spur Syndrome)  These exercises may help you when beginning to rehabilitate your injury. They may resolve your symptoms with or without further involvement from your physician, physical therapist or athletic trainer. While completing these exercises, remember:   Muscles can gain both the endurance and the strength needed for everyday activities through controlled exercises.  Complete these exercises as instructed by your physician, physical therapist or athletic trainer. Progress the resistance and repetitions only as guided. STRENGTH - Towel  Curls  Sit in a chair positioned on a non-carpeted surface.  Place your foot on a towel, keeping your heel on the floor.  Pull the towel toward your heel by only curling your toes. Keep your heel on the floor.  If instructed by your physician, physical therapist or athletic trainer, add ____________________ at the end of the towel. Repeat __________ times. Complete this exercise __________ times per day. STRENGTH - Ankle Inversion  Secure one end of a rubber exercise band/tubing to a fixed object (table, pole). Loop the other end around your foot just before your toes.  Place your fists between your knees. This will focus your strengthening at your ankle.  Slowly, pull your big toe up and in, making sure the band/tubing is positioned to resist the entire motion.  Hold this position for __________ seconds.  Have your muscles resist the band/tubing as it slowly pulls your foot back to the starting position. Repeat __________ times. Complete this exercises __________ times per day.  Document Released: 05/17/2005 Document Revised: 08/09/2011 Document Reviewed: 08/29/2008 [employer] Patient Information 2014 [employer]. Plantar Fasciitis Plantar fasciitis is a common condition that causes foot pain. It is soreness (inflammation) of the band of tough fibrous tissue on the bottom of the foot that runs from the heel bone (calcaneus) to the ball of the foot. The cause of this soreness may be from excessive standing, poor fitting shoes, running on hard surfaces, being overweight, having an abnormal walk, or overuse (this is common in runners) of the painful foot or feet. It is also common in aerobic exercise dancers and ballet dancers. SYMPTOMS  Most people with plantar fasciitis complain of:  Severe pain in the morning on the bottom of their foot especially when taking the first steps out of bed. This pain recedes after a few minutes of walking.  Severe pain is experienced also during walking  following a long period of inactivity.  Pain is worse when walking barefoot or up stairs DIAGNOSIS   Your caregiver will diagnose this condition by examining and feeling your foot.  Special tests such as X-rays of your foot, are usually not needed. PREVENTION   Consult a sports medicine professional before beginning a new exercise program.  Walking programs offer a good workout. With walking there is a lower chance of overuse injuries common to runners. There is less impact and less jarring of the joints.  Begin all new exercise programs slowly. If problems or pain develop, decrease the amount of time or distance until you are at a comfortable level.  Wear good shoes and replace them regularly.  Stretch your foot and the heel cords at the back of the ankle (Achilles tendon) both before and after exercise.  Run or exercise on even surfaces that are not hard. For example, asphalt is better than pavement.  Do not run barefoot on hard surfaces.  If using a treadmill, vary the incline.  Do not continue to workout if you have foot or joint   problems. Seek professional help if they do not improve. HOME CARE INSTRUCTIONS   Avoid activities that cause you pain until you recover.  Use ice or cold packs on the problem or painful areas after working out.  Only take over-the-counter or prescription medicines for pain, discomfort, or fever as directed by your caregiver.  Soft shoe inserts or athletic shoes with air or gel sole cushions may be helpful.  If problems continue or become more severe, consult a sports medicine caregiver or your own health care provider. Cortisone is a potent anti-inflammatory medication that may be injected into the painful area. You can discuss this treatment with your caregiver. MAKE SURE YOU:   Understand these instructions.  Will watch your condition.  Will get help right away if you are not doing well or get worse. Document Released: 02/09/2001 Document  Revised: 08/09/2011 Document Reviewed: 04/10/2008 [employer] Patient Information 2014 [employer].  

## 2013-07-05 NOTE — Progress Notes (Signed)
Christopher [name] presents today with a chief complaint of left heel pain. He states that he was trying to avoid stepping on his When he hurt his left heel.  Objective: Vital signs are stable he is alert and oriented x3. Pulses are strongly palpable left lower extremity. He has no pain on medial and lateral compression of the calcaneus. He does have pain on direct palpation of the medial calcaneal tubercle. Radiographic evaluation does not demonstrate any type of osseous abnormalities other than a soft tissue increase in density at the plantar fascial calcaneal insertion site.  Assessment: Plantar fasciitis probably traumatic in nature left foot.  Plan: Discussed the etiology pathology conservative versus surgical therapies at this point we injected his left heel. At the point of maximal tenderness with Kenalog and local anesthetic. He was also prescribed a Medrol Dosepak which she will start tomorrow I will followup with him in one month.

## 2013-07-17 ENCOUNTER — Other Ambulatory Visit: Payer: Self-pay | Admitting: Cardiovascular Disease

## 2013-07-24 DIAGNOSIS — E785 Hyperlipidemia, unspecified: Secondary | ICD-10-CM | POA: Diagnosis not present

## 2013-07-24 DIAGNOSIS — I10 Essential (primary) hypertension: Secondary | ICD-10-CM | POA: Diagnosis not present

## 2013-08-02 ENCOUNTER — Ambulatory Visit: Payer: Self-pay

## 2013-08-02 ENCOUNTER — Encounter: Payer: Self-pay | Admitting: Podiatry

## 2013-08-02 ENCOUNTER — Ambulatory Visit (INDEPENDENT_AMBULATORY_CARE_PROVIDER_SITE_OTHER): Payer: Medicare Other | Admitting: Podiatry

## 2013-08-02 VITALS — BP 125/68 | HR 60 | Resp 16 | Ht 71.0 in | Wt 217.0 lb

## 2013-08-02 DIAGNOSIS — M722 Plantar fascial fibromatosis: Secondary | ICD-10-CM | POA: Diagnosis not present

## 2013-08-02 DIAGNOSIS — E042 Nontoxic multinodular goiter: Secondary | ICD-10-CM | POA: Diagnosis not present

## 2013-08-02 DIAGNOSIS — E0590 Thyrotoxicosis, unspecified without thyrotoxic crisis or storm: Secondary | ICD-10-CM | POA: Diagnosis not present

## 2013-08-02 DIAGNOSIS — R222 Localized swelling, mass and lump, trunk: Secondary | ICD-10-CM | POA: Diagnosis not present

## 2013-08-02 NOTE — Progress Notes (Signed)
Christopher [name] presents today for followup of his right heel. He states that the injection we provided with him last time really did not help very much at all.  Objective: Vital signs are stable he is alert and oriented x3. Pain on palpation medial continued tubercle of the right heel particularly plantar tubercle.  Assessment: Plantar fasciitis care rule out a tear to the plantar fascia.  Plan: Injected Kenalog and local anesthetic to the point of maximal tenderness. May need to consider dehydrated alcohol in the near future.

## 2013-08-03 DIAGNOSIS — R222 Localized swelling, mass and lump, trunk: Secondary | ICD-10-CM | POA: Diagnosis not present

## 2013-08-03 DIAGNOSIS — E0590 Thyrotoxicosis, unspecified without thyrotoxic crisis or storm: Secondary | ICD-10-CM | POA: Diagnosis not present

## 2013-08-03 DIAGNOSIS — E042 Nontoxic multinodular goiter: Secondary | ICD-10-CM | POA: Diagnosis not present

## 2013-08-03 DIAGNOSIS — R220 Localized swelling, mass and lump, head: Secondary | ICD-10-CM | POA: Diagnosis not present

## 2013-08-13 DIAGNOSIS — E041 Nontoxic single thyroid nodule: Secondary | ICD-10-CM | POA: Diagnosis not present

## 2013-08-13 DIAGNOSIS — E042 Nontoxic multinodular goiter: Secondary | ICD-10-CM | POA: Diagnosis not present

## 2013-08-29 ENCOUNTER — Ambulatory Visit (INDEPENDENT_AMBULATORY_CARE_PROVIDER_SITE_OTHER): Payer: Medicare Other | Admitting: Podiatry

## 2013-08-29 VITALS — Resp 16 | Ht 70.0 in | Wt 218.0 lb

## 2013-08-29 DIAGNOSIS — M722 Plantar fascial fibromatosis: Secondary | ICD-10-CM | POA: Diagnosis not present

## 2013-08-29 NOTE — Progress Notes (Signed)
Christopher [name] presents today stating that his plantar fasciitis or Baxter's neuritis is approximately 80-90% better.  Objective: Vital signs are stable he is alert and oriented x3. Minimal pain on palpation medial calcaneal tubercle of the right heel.  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  Plan: Third injection of dehydrated alcohol right heel

## 2013-08-30 DIAGNOSIS — I2510 Atherosclerotic heart disease of native coronary artery without angina pectoris: Secondary | ICD-10-CM | POA: Diagnosis not present

## 2013-08-30 DIAGNOSIS — R12 Heartburn: Secondary | ICD-10-CM | POA: Diagnosis not present

## 2013-08-30 DIAGNOSIS — Z0000 Encounter for general adult medical examination without abnormal findings: Secondary | ICD-10-CM | POA: Diagnosis not present

## 2013-08-30 DIAGNOSIS — Z23 Encounter for immunization: Secondary | ICD-10-CM | POA: Diagnosis not present

## 2013-08-30 DIAGNOSIS — E785 Hyperlipidemia, unspecified: Secondary | ICD-10-CM | POA: Diagnosis not present

## 2013-08-30 DIAGNOSIS — I10 Essential (primary) hypertension: Secondary | ICD-10-CM | POA: Diagnosis not present

## 2013-09-19 DIAGNOSIS — H353190 Nonexudative age-related macular degeneration, unspecified eye, stage unspecified: Secondary | ICD-10-CM | POA: Diagnosis not present

## 2013-09-26 ENCOUNTER — Ambulatory Visit (INDEPENDENT_AMBULATORY_CARE_PROVIDER_SITE_OTHER): Payer: Medicare Other | Admitting: Podiatry

## 2013-09-26 VITALS — Resp 16 | Ht 71.0 in | Wt 217.0 lb

## 2013-09-26 DIAGNOSIS — G5790 Unspecified mononeuropathy of unspecified lower limb: Secondary | ICD-10-CM

## 2013-09-26 DIAGNOSIS — G5791 Unspecified mononeuropathy of right lower limb: Secondary | ICD-10-CM

## 2013-09-26 DIAGNOSIS — M722 Plantar fascial fibromatosis: Secondary | ICD-10-CM

## 2013-09-26 NOTE — Progress Notes (Signed)
He presents today for followup of his plantar fasciitis for it was 80-90% better

## 2013-10-17 ENCOUNTER — Ambulatory Visit: Payer: Self-pay | Admitting: Podiatry

## 2013-11-07 DIAGNOSIS — Z1283 Encounter for screening for malignant neoplasm of skin: Secondary | ICD-10-CM | POA: Diagnosis not present

## 2013-11-07 DIAGNOSIS — D485 Neoplasm of uncertain behavior of skin: Secondary | ICD-10-CM | POA: Diagnosis not present

## 2013-11-07 DIAGNOSIS — Z872 Personal history of diseases of the skin and subcutaneous tissue: Secondary | ICD-10-CM | POA: Diagnosis not present

## 2013-11-07 DIAGNOSIS — L570 Actinic keratosis: Secondary | ICD-10-CM | POA: Diagnosis not present

## 2013-11-08 ENCOUNTER — Ambulatory Visit: Payer: Medicare Other | Admitting: Podiatry

## 2013-11-08 VITALS — BP 128/69 | HR 64 | Resp 16

## 2013-11-08 DIAGNOSIS — M722 Plantar fascial fibromatosis: Secondary | ICD-10-CM

## 2013-11-09 NOTE — Progress Notes (Signed)
He presents today for his first shockwave therapy right heel. He tolerated procedure well.  Objective: Pain on palpation medial continued tubercle right heel.  Assessment: Plantar fasciitis right.  Plan: Shockwave #1 today 5000 shocks with frequency of 14 and Barr of 3.3

## 2013-11-12 ENCOUNTER — Ambulatory Visit (INDEPENDENT_AMBULATORY_CARE_PROVIDER_SITE_OTHER): Payer: Medicare Other | Admitting: Cardiovascular Disease

## 2013-11-12 ENCOUNTER — Encounter: Payer: Self-pay | Admitting: Cardiovascular Disease

## 2013-11-12 VITALS — BP 138/72 | HR 62 | Ht 71.0 in | Wt 219.5 lb

## 2013-11-12 DIAGNOSIS — I739 Peripheral vascular disease, unspecified: Secondary | ICD-10-CM | POA: Diagnosis not present

## 2013-11-12 DIAGNOSIS — K759 Inflammatory liver disease, unspecified: Secondary | ICD-10-CM | POA: Diagnosis not present

## 2013-11-12 DIAGNOSIS — E782 Mixed hyperlipidemia: Secondary | ICD-10-CM | POA: Diagnosis not present

## 2013-11-12 DIAGNOSIS — T466X5A Adverse effect of antihyperlipidemic and antiarteriosclerotic drugs, initial encounter: Secondary | ICD-10-CM

## 2013-11-12 DIAGNOSIS — I2510 Atherosclerotic heart disease of native coronary artery without angina pectoris: Secondary | ICD-10-CM | POA: Diagnosis not present

## 2013-11-12 DIAGNOSIS — R079 Chest pain, unspecified: Secondary | ICD-10-CM

## 2013-11-12 DIAGNOSIS — K719 Toxic liver disease, unspecified: Secondary | ICD-10-CM

## 2013-11-12 NOTE — Assessment & Plan Note (Signed)
No recent episodes of chest pain. No further testing at this time

## 2013-11-12 NOTE — Progress Notes (Signed)
Patient ID: Christopher [name], male    DOB: 1931/06/24, 80 y.o.   MRN: [id number]  HPI Comments: [name] is a very pleasant 80 year old gentleman with a history of coronary artery disease, stent placed to his distal RCA in 2005, also stent placed to his mid LAD at the same time with residual 50% obtuse marginal disease with repeat stress test in October 2009 showing no ischemia who presents for routine followup.   no significant aortic disease, mild carotid plaquing with mixed irregular plaque estimated at 40% on the right,  Continued significant stress in his life as his wife has dementia.    In followup today, he continues to care for his wife. Significant stress. No recent episodes of dizziness. No chest pain or shortness of breath symptoms. He does not do any regular exercise program. Recent lab work showing total cholesterol 132, LDL 58, HDL 39  History of total knee replacement with rehabilitation at [facility]. At the rehabilitation, he was receiving atorvastatin 80 mg daily with Vytorin (dose unclear). He became very ill, had chills, rigors, sweating, urine was very dark. He was noticed to be jaundice and checked himself out of rehabilitation and went to the hospital, noted to have hepatitis. Symptoms resolved by holding the statin's. LFTs improved back to normal  Lipitor was restarted at low dose 20 mg   Prior episode of severe dizziness. He was driving at the time and had to pullover. Symptoms resolved without intervention. That night he developed upper respiratory infection with runny nose. Currently has severe cough and  unable to sleep. Additional mild episode of dizziness several days later. No further episodes since then  Cardiac catheter September 2005 shows 90% mid LAD, 80% distal RCA, 50% OM1 at the mid vessel, Taxus stent 2.75 x 24 mm stent to his LAD, TAxus stent 2.5 x 16 mm stent to the RCA.   stress test in July 2011 showed mild ischemia in the inferior wall consistent with  previous stress test   EKG shows normal sinus rhythm with rate 62 beats per minute,no significant ST or T wave changes    Outpatient Encounter Prescriptions as of 11/12/2013  Medication Sig  . amLODipine-benazepril (LOTREL) 10-20 MG per capsule Take 1 capsule by mouth daily.  Marland Kitchen aspirin 81 MG EC tablet Take 81 mg by mouth daily.    Marland Kitchen atorvastatin (LIPITOR) 20 MG tablet Take 20 mg by mouth daily.  . clopidogrel (PLAVIX) 75 MG tablet TAKE 1 TABLET BY MOUTH EVERY DAY  . metoprolol tartrate (LOPRESSOR) 25 MG tablet Take 0.5 tablets (12.5 mg total) by mouth 2 (two) times daily.  . Omega-3 Fatty Acids (FISH OIL) 1200 MG CAPS Take 1,200 mg by mouth daily.  Marland Kitchen omeprazole (PRILOSEC) 20 MG capsule Take 1 capsule (20 mg total) by mouth 2 (two) times daily.   Review of Systems  Constitutional: Negative.   HENT: Negative.   Eyes: Negative.   Respiratory: Negative.   Cardiovascular: Negative.   Gastrointestinal: Negative.   Endocrine: Negative.   Musculoskeletal: Negative.   Skin: Negative.   Allergic/Immunologic: Negative.   Neurological: Negative.   Hematological: Negative.   Psychiatric/Behavioral: Negative.   All other systems reviewed and are negative.   BP 138/72  Pulse 62  Ht 5\' 11"  (1.803 m)  Wt 219 lb 8 oz (99.565 kg)  BMI 30.63 kg/m2  Physical Exam  Nursing note and vitals reviewed. Constitutional: He is oriented to person, place, and time. He appears well-developed and well-nourished.  HENT:  Head: Normocephalic.  Nose: Nose normal.  Mouth/Throat: Oropharynx is clear and moist.  Eyes: Conjunctivae are normal. Pupils are equal, round, and reactive to light.  Neck: Normal range of motion. Neck supple. No JVD present.  Cardiovascular: Normal rate, regular rhythm, S1 normal, S2 normal, normal heart sounds and intact distal pulses.  Exam reveals no gallop and no friction rub.   No murmur heard. Pulmonary/Chest: Effort normal and breath sounds normal. No respiratory distress. He  has no wheezes. He has no rales. He exhibits no tenderness.  Abdominal: Soft. Bowel sounds are normal. He exhibits no distension. There is no tenderness.  Musculoskeletal: Normal range of motion. He exhibits no edema and no tenderness.  Lymphadenopathy:    He has no cervical adenopathy.  Neurological: He is alert and oriented to person, place, and time. Coordination normal.  Skin: Skin is warm and dry. No rash noted. No erythema.  Psychiatric: He has a normal mood and affect. His behavior is normal. Judgment and thought content normal.      Assessment and Plan

## 2013-11-12 NOTE — Assessment & Plan Note (Signed)
Previous ultrasound with mild carotid plaquing. Continue to work on aggressive cholesterol control

## 2013-11-12 NOTE — Assessment & Plan Note (Signed)
Currently with no symptoms of angina. No further workup at this time. Continue current medication regimen. 

## 2013-11-12 NOTE — Assessment & Plan Note (Signed)
Cholesterol is at goal on the current lipid regimen. No changes to the medications were made.  

## 2013-11-12 NOTE — Patient Instructions (Signed)
You are doing well. No medication changes were made.  Please call us if you have new issues that need to be addressed before your next appt.  Your physician wants you to follow-up in: 6 months.  You will receive a reminder letter in the mail two months in advance. If you don't receive a letter, please call our office to schedule the follow-up appointment.   

## 2013-11-12 NOTE — Assessment & Plan Note (Signed)
No further liver issues. Tolerating Lipitor 20 mg daily

## 2013-11-15 ENCOUNTER — Ambulatory Visit: Payer: Medicare Other | Admitting: Podiatry

## 2013-11-22 ENCOUNTER — Encounter: Payer: Self-pay | Admitting: Podiatry

## 2013-11-22 ENCOUNTER — Ambulatory Visit (INDEPENDENT_AMBULATORY_CARE_PROVIDER_SITE_OTHER): Payer: Medicare Other | Admitting: Podiatry

## 2013-11-22 DIAGNOSIS — M722 Plantar fascial fibromatosis: Secondary | ICD-10-CM

## 2013-11-22 NOTE — Progress Notes (Signed)
Bar  3.8  freq 14.0   Pulses 5000

## 2013-11-28 DIAGNOSIS — L570 Actinic keratosis: Secondary | ICD-10-CM | POA: Diagnosis not present

## 2013-11-28 DIAGNOSIS — L723 Sebaceous cyst: Secondary | ICD-10-CM | POA: Diagnosis not present

## 2013-11-29 ENCOUNTER — Ambulatory Visit (INDEPENDENT_AMBULATORY_CARE_PROVIDER_SITE_OTHER): Payer: Medicare Other | Admitting: Podiatry

## 2013-11-29 ENCOUNTER — Encounter: Payer: Self-pay | Admitting: Podiatry

## 2013-11-29 VITALS — BP 118/66 | HR 65 | Resp 16

## 2013-11-29 DIAGNOSIS — M722 Plantar fascial fibromatosis: Secondary | ICD-10-CM

## 2013-11-29 NOTE — Progress Notes (Signed)
epat #3 its doing good-rt  4.6 bar, 5000 shocks, 14.0 freq

## 2013-11-29 NOTE — Progress Notes (Signed)
Christopher [name] presents today for his 4th EPAT.  He states that he is doing much better.  5000 Shocks 4.6 Bar 14 Hz Frequency

## 2013-12-06 ENCOUNTER — Ambulatory Visit (INDEPENDENT_AMBULATORY_CARE_PROVIDER_SITE_OTHER): Payer: Medicare Other | Admitting: Podiatry

## 2013-12-06 VITALS — BP 116/58 | HR 60 | Resp 16

## 2013-12-06 DIAGNOSIS — M722 Plantar fascial fibromatosis: Secondary | ICD-10-CM

## 2013-12-06 NOTE — Progress Notes (Signed)
Han PRESENTS FOR EPAT #5: BAR 5.0, FREQUENCY 14, PULSE 5000

## 2013-12-13 ENCOUNTER — Encounter: Payer: Self-pay | Admitting: Podiatry

## 2013-12-13 ENCOUNTER — Ambulatory Visit (INDEPENDENT_AMBULATORY_CARE_PROVIDER_SITE_OTHER): Payer: Medicare Other | Admitting: Podiatry

## 2013-12-13 VITALS — BP 124/63 | HR 64 | Resp 12

## 2013-12-13 DIAGNOSIS — M722 Plantar fascial fibromatosis: Secondary | ICD-10-CM

## 2013-12-13 NOTE — Progress Notes (Signed)
Bar 5. freq 14.0  Pulses 5000

## 2013-12-14 ENCOUNTER — Other Ambulatory Visit: Payer: Self-pay | Admitting: Cardiovascular Disease

## 2013-12-27 ENCOUNTER — Ambulatory Visit: Payer: Self-pay | Admitting: Podiatry

## 2013-12-27 ENCOUNTER — Ambulatory Visit (INDEPENDENT_AMBULATORY_CARE_PROVIDER_SITE_OTHER): Payer: Medicare Other | Admitting: Podiatry

## 2013-12-27 DIAGNOSIS — M722 Plantar fascial fibromatosis: Secondary | ICD-10-CM

## 2013-12-27 NOTE — Progress Notes (Signed)
Epat#5  Bar 5.0 freq 14.0   Pulses  5000

## 2014-01-12 ENCOUNTER — Other Ambulatory Visit: Payer: Self-pay | Admitting: Cardiovascular Disease

## 2014-02-18 DIAGNOSIS — L570 Actinic keratosis: Secondary | ICD-10-CM | POA: Diagnosis not present

## 2014-02-18 DIAGNOSIS — L719 Rosacea, unspecified: Secondary | ICD-10-CM | POA: Diagnosis not present

## 2014-02-22 ENCOUNTER — Telehealth: Payer: Self-pay

## 2014-02-22 ENCOUNTER — Encounter: Payer: Self-pay | Admitting: Cardiovascular Disease

## 2014-02-22 ENCOUNTER — Ambulatory Visit (INDEPENDENT_AMBULATORY_CARE_PROVIDER_SITE_OTHER): Payer: Medicare Other

## 2014-02-22 VITALS — BP 138/64 | HR 56 | Wt 218.2 lb

## 2014-02-22 DIAGNOSIS — R079 Chest pain, unspecified: Secondary | ICD-10-CM

## 2014-02-22 NOTE — Progress Notes (Signed)
1.) Reason for visit: Pain in center of chest radiating to left arm since this am.    2.) Name of MD requesting visit: Dr. Rockey Situ  3.) H&P: Pt reports that he woke this am and performed his normal routine of helping his wife with her shower and feeding her breakfast, as she has Alzheimer's.  He did not do anything unusual, but developed pain in the center of his chest that radiated to his left arm.  Describes the sensation as "discomfort".  Pt took an aspirin, reports this seemed to help.  Reports significant stressors at home due to his wife's illness, but states that this has become part of his "normal".   4.) ROS related to problem: Pt reports discomfort in center of his chest radiating to left arm, pain relieved w/ aspirin.  Denies SOB, nausea, vomiting or diaphoresis.  Reports that pain in chest has almost completely resolved on visit, but still c/o pain in left arm.  5.) Assessment and plan per MD: Discussed w/ Dr. Rockey Situ.  He advises that sx are most likely stress related.  Offered to send orders w/ pt to have troponin drawn today.  Pt declines, as he agrees that he is under a significant amount of stress and does not want to pursue further testing.  He reports that he has an appt w/ PCP for CPE and will have labs drawn at that time. Pt verbalizes understanding to call 911 or have someone take him to ED if sx become emergent.

## 2014-02-22 NOTE — Telephone Encounter (Signed)
Spoke w/ pt.  He states that he is having chest discomfort in his left arm and the center of his chest.  Denies SOB, but states that he is having to take deeper breaths than normal. Denies nausea or diaphoresis.  Pt has a lot of stressors at home and would like to have an EKG to see if this is cardiac or stress related.  Advised pt to have someone drive him here for an EKG, but if sx worsen, to call 911 or proceed to ED.  He verbalizes understanding.

## 2014-02-22 NOTE — Patient Instructions (Signed)
Call or return to clinic prn if these symptoms worsen or fail to improve as anticipated.

## 2014-02-25 DIAGNOSIS — E119 Type 2 diabetes mellitus without complications: Secondary | ICD-10-CM | POA: Diagnosis not present

## 2014-02-25 DIAGNOSIS — I10 Essential (primary) hypertension: Secondary | ICD-10-CM | POA: Diagnosis not present

## 2014-02-25 DIAGNOSIS — E785 Hyperlipidemia, unspecified: Secondary | ICD-10-CM | POA: Diagnosis not present

## 2014-02-28 DIAGNOSIS — Z23 Encounter for immunization: Secondary | ICD-10-CM | POA: Diagnosis not present

## 2014-02-28 DIAGNOSIS — I10 Essential (primary) hypertension: Secondary | ICD-10-CM | POA: Diagnosis not present

## 2014-02-28 DIAGNOSIS — I2510 Atherosclerotic heart disease of native coronary artery without angina pectoris: Secondary | ICD-10-CM | POA: Diagnosis not present

## 2014-02-28 DIAGNOSIS — N400 Enlarged prostate without lower urinary tract symptoms: Secondary | ICD-10-CM | POA: Diagnosis not present

## 2014-03-07 DIAGNOSIS — E041 Nontoxic single thyroid nodule: Secondary | ICD-10-CM | POA: Diagnosis not present

## 2014-03-18 ENCOUNTER — Other Ambulatory Visit: Payer: Self-pay | Admitting: *Deleted

## 2014-03-18 DIAGNOSIS — E041 Nontoxic single thyroid nodule: Secondary | ICD-10-CM | POA: Diagnosis not present

## 2014-03-18 DIAGNOSIS — E0590 Thyrotoxicosis, unspecified without thyrotoxic crisis or storm: Secondary | ICD-10-CM | POA: Diagnosis not present

## 2014-03-18 MED ORDER — CLOPIDOGREL BISULFATE 75 MG PO TABS: ORAL_TABLET | ORAL | Status: DC

## 2014-03-31 ENCOUNTER — Other Ambulatory Visit: Payer: Self-pay | Admitting: Cardiovascular Disease

## 2014-05-28 DIAGNOSIS — L570 Actinic keratosis: Secondary | ICD-10-CM | POA: Diagnosis not present

## 2014-05-28 DIAGNOSIS — Z872 Personal history of diseases of the skin and subcutaneous tissue: Secondary | ICD-10-CM | POA: Diagnosis not present

## 2014-05-28 DIAGNOSIS — Z1283 Encounter for screening for malignant neoplasm of skin: Secondary | ICD-10-CM | POA: Diagnosis not present

## 2014-05-31 HISTORY — PX: CHOLECYSTECTOMY: SHX55

## 2014-06-12 ENCOUNTER — Ambulatory Visit (INDEPENDENT_AMBULATORY_CARE_PROVIDER_SITE_OTHER): Payer: Medicare Other | Admitting: Cardiovascular Disease

## 2014-06-12 ENCOUNTER — Encounter: Payer: Self-pay | Admitting: Cardiovascular Disease

## 2014-06-12 VITALS — BP 120/68 | HR 54 | Ht 70.5 in | Wt 219.5 lb

## 2014-06-12 DIAGNOSIS — E782 Mixed hyperlipidemia: Secondary | ICD-10-CM | POA: Diagnosis not present

## 2014-06-12 DIAGNOSIS — I2510 Atherosclerotic heart disease of native coronary artery without angina pectoris: Secondary | ICD-10-CM | POA: Diagnosis not present

## 2014-06-12 DIAGNOSIS — R7309 Other abnormal glucose: Secondary | ICD-10-CM | POA: Diagnosis not present

## 2014-06-12 DIAGNOSIS — R7303 Prediabetes: Secondary | ICD-10-CM | POA: Insufficient documentation

## 2014-06-12 DIAGNOSIS — I739 Peripheral vascular disease, unspecified: Secondary | ICD-10-CM

## 2014-06-12 NOTE — Assessment & Plan Note (Signed)
We have recommended close follow-up with Dr. Ola Spurr for lab work. Prior glucose level 150

## 2014-06-12 NOTE — Patient Instructions (Signed)
You are doing well. No medication changes were made.  Please call us if you have new issues that need to be addressed before your next appt.  Your physician wants you to follow-up in: 12 months.  You will receive a reminder letter in the mail two months in advance. If you don't receive a letter, please call our office to schedule the follow-up appointment. 

## 2014-06-12 NOTE — Assessment & Plan Note (Signed)
Cholesterol is at goal on the current lipid regimen. No changes to the medications were made.  

## 2014-06-12 NOTE — Assessment & Plan Note (Signed)
Previous ultrasound with mild carotid plaquing. Continue aggressive cholesterol control

## 2014-06-12 NOTE — Progress Notes (Signed)
Patient ID: Christopher [name], male    DOB: Jun 04, 1931, 81 y.o.   MRN: [id number]  HPI Comments: Christopher [name] is a very pleasant 81 year old gentleman with a history of coronary artery disease, stent placed to his distal RCA in 2005, also stent placed to his mid LAD at the same time with residual 50% obtuse marginal disease with repeat stress test in October 2009 showing no ischemia who presents for routine followup of his coronary artery disease   no significant aortic disease, mild carotid plaquing with mixed irregular plaque estimated at 40% on the right,  Continued significant stress in his life as his wife has dementia.     he continues to care for his wife. Significant stress. No recent episodes of dizziness. No chest pain or shortness of breath symptoms. He reports that he has started going to the gym February 2015 lab work showing total cholesterol 132, LDL 58, HDL 39 At that time glucose level is 150 No new complaints, tolerating his medications well.  EKG shows normal sinus rhythm with rate 54 beats per minute,no significant ST or T wave changes   Other past medical history History of total knee replacement with rehabilitation at [facility]. At the rehabilitation, he was receiving atorvastatin 80 mg daily with Vytorin (dose unclear). He became very ill, had chills, rigors, sweating, urine was very dark. He was noticed to be jaundice and checked himself out of rehabilitation and went to the hospital, noted to have hepatitis. Symptoms resolved by holding the statin's. LFTs improved back to normal  Lipitor was restarted at low dose 20 mg   Prior episode of severe dizziness. He was driving at the time and had to pullover. Symptoms resolved without intervention. That night he developed upper respiratory infection with runny nose. Currently has severe cough and  unable to sleep. Additional mild episode of dizziness several days later. No further episodes since then  Cardiac catheter September  2005 shows 90% mid LAD, 80% distal RCA, 50% OM1 at the mid vessel, Taxus stent 2.75 x 24 mm stent to his LAD, TAxus stent 2.5 x 16 mm stent to the RCA.   stress test in July 2011 showed mild ischemia in the inferior wall consistent with previous stress test      No Known Allergies  Outpatient Encounter Prescriptions as of 06/12/2014  Medication Sig  . amLODipine-benazepril (LOTREL) 10-20 MG per capsule TAKE 1 CAPSULE BY MOUTH DAILY.  Marland Kitchen aspirin 81 MG EC tablet Take 81 mg by mouth daily.    Marland Kitchen atorvastatin (LIPITOR) 20 MG tablet Take 20 mg by mouth daily.  . clopidogrel (PLAVIX) 75 MG tablet TAKE 1 TABLET BY MOUTH EVERY DAY  . metoprolol tartrate (LOPRESSOR) 25 MG tablet Take 0.5 tablets (12.5 mg total) by mouth 2 (two) times daily.  . Omega-3 Fatty Acids (FISH OIL) 1200 MG CAPS Take 1,200 mg by mouth daily.  Marland Kitchen omeprazole (PRILOSEC) 20 MG capsule TAKE 1 CAPSULE BY MOUTH 2 TIMES DAILY.  . [DISCONTINUED] atorvastatin (LIPITOR) 20 MG tablet TAKE 1 TABLET BY MOUTH DAILY. (Patient not taking: Reported on 06/12/2014)    Past Medical History  Diagnosis Date  . Coronary artery disease   . Hyperlipidemia   . Hypertension     Past Surgical History  Procedure Laterality Date  . Cardiac catheterization    . Knee surgery      right    Social History  reports that he has quit smoking. He has never used smokeless tobacco. He reports  that he does not drink alcohol or use illicit drugs.  Family History family history includes Heart failure in his mother.       Review of Systems  Constitutional: Negative.   Respiratory: Negative.   Cardiovascular: Negative.   Gastrointestinal: Negative.   Musculoskeletal: Negative.   Skin: Negative.   Neurological: Negative.   Hematological: Negative.   Psychiatric/Behavioral: Negative.   All other systems reviewed and are negative.   BP 120/68 mmHg  Pulse 54  Ht 5' 10.5" (1.791 m)  Wt 219 lb 8 oz (99.565 kg)  BMI 31.04 kg/m2  Physical Exam   Constitutional: He is oriented to person, place, and time. He appears well-developed and well-nourished.  HENT:  Head: Normocephalic.  Nose: Nose normal.  Mouth/Throat: Oropharynx is clear and moist.  Eyes: Conjunctivae are normal. Pupils are equal, round, and reactive to light.  Neck: Normal range of motion. Neck supple. No JVD present.  Cardiovascular: Normal rate, regular rhythm, S1 normal, S2 normal, normal heart sounds and intact distal pulses.  Exam reveals no gallop and no friction rub.   No murmur heard. Pulmonary/Chest: Effort normal and breath sounds normal. No respiratory distress. He has no wheezes. He has no rales. He exhibits no tenderness.  Abdominal: Soft. Bowel sounds are normal. He exhibits no distension. There is no tenderness.  Musculoskeletal: Normal range of motion. He exhibits no edema or tenderness.  Lymphadenopathy:    He has no cervical adenopathy.  Neurological: He is alert and oriented to person, place, and time. Coordination normal.  Skin: Skin is warm and dry. No rash noted. No erythema.  Psychiatric: He has a normal mood and affect. His behavior is normal. Judgment and thought content normal.      Assessment and Plan   Nursing note and vitals reviewed.

## 2014-06-12 NOTE — Assessment & Plan Note (Signed)
Currently with no symptoms of angina. No further workup at this time. Continue current medication regimen. 

## 2014-07-04 DIAGNOSIS — R1013 Epigastric pain: Secondary | ICD-10-CM | POA: Diagnosis not present

## 2014-07-04 DIAGNOSIS — N281 Cyst of kidney, acquired: Secondary | ICD-10-CM | POA: Diagnosis not present

## 2014-07-04 DIAGNOSIS — R079 Chest pain, unspecified: Secondary | ICD-10-CM | POA: Diagnosis not present

## 2014-07-04 DIAGNOSIS — R7989 Other specified abnormal findings of blood chemistry: Secondary | ICD-10-CM | POA: Diagnosis not present

## 2014-07-04 DIAGNOSIS — N400 Enlarged prostate without lower urinary tract symptoms: Secondary | ICD-10-CM | POA: Diagnosis not present

## 2014-07-04 DIAGNOSIS — K5900 Constipation, unspecified: Secondary | ICD-10-CM | POA: Diagnosis not present

## 2014-07-04 DIAGNOSIS — R740 Nonspecific elevation of levels of transaminase and lactic acid dehydrogenase [LDH]: Secondary | ICD-10-CM | POA: Diagnosis not present

## 2014-07-04 DIAGNOSIS — I2510 Atherosclerotic heart disease of native coronary artery without angina pectoris: Secondary | ICD-10-CM | POA: Diagnosis not present

## 2014-07-04 DIAGNOSIS — R0602 Shortness of breath: Secondary | ICD-10-CM | POA: Diagnosis not present

## 2014-07-04 DIAGNOSIS — I10 Essential (primary) hypertension: Secondary | ICD-10-CM | POA: Diagnosis not present

## 2014-07-04 DIAGNOSIS — K8020 Calculus of gallbladder without cholecystitis without obstruction: Secondary | ICD-10-CM | POA: Diagnosis not present

## 2014-07-04 LAB — HEPATIC FUNCTION PANEL A (ARMC)
Albumin: 4 g/dL (ref 3.4–5.0)
Alkaline Phosphatase: 212 U/L — ABNORMAL HIGH (ref 46–116)
BILIRUBIN TOTAL: 1.9 mg/dL — AB (ref 0.2–1.0)
Bilirubin, Direct: 0.9 mg/dL — ABNORMAL HIGH (ref 0.0–0.2)
SGOT(AST): 173 U/L — ABNORMAL HIGH (ref 15–37)
SGPT (ALT): 179 U/L — ABNORMAL HIGH (ref 14–63)
Total Protein: 6.9 g/dL (ref 6.4–8.2)

## 2014-07-04 LAB — BASIC METABOLIC PANEL
ANION GAP: 6 — AB (ref 7–16)
BUN: 21 mg/dL — ABNORMAL HIGH (ref 7–18)
CREATININE: 1.11 mg/dL (ref 0.60–1.30)
Calcium, Total: 8.7 mg/dL (ref 8.5–10.1)
Chloride: 104 mmol/L (ref 98–107)
Co2: 29 mmol/L (ref 21–32)
GLUCOSE: 143 mg/dL — AB (ref 65–99)
Osmolality: 283 (ref 275–301)
Potassium: 4.3 mmol/L (ref 3.5–5.1)
Sodium: 139 mmol/L (ref 136–145)

## 2014-07-04 LAB — CBC
HCT: 43.4 % (ref 40.0–52.0)
HGB: 15.2 g/dL (ref 13.0–18.0)
MCH: 31.3 pg (ref 26.0–34.0)
MCHC: 34.9 g/dL (ref 32.0–36.0)
MCV: 90 fL (ref 80–100)
PLATELETS: 144 10*3/uL — AB (ref 150–440)
RBC: 4.84 10*6/uL (ref 4.40–5.90)
RDW: 13.6 % (ref 11.5–14.5)
WBC: 7.1 10*3/uL (ref 3.8–10.6)

## 2014-07-04 LAB — TROPONIN I: Troponin-I: <0.02 ng/mL

## 2014-07-04 LAB — LIPASE, BLOOD: LIPASE: 247 U/L (ref 73–393)

## 2014-07-05 DIAGNOSIS — K8050 Calculus of bile duct without cholangitis or cholecystitis without obstruction: Secondary | ICD-10-CM | POA: Diagnosis not present

## 2014-07-05 DIAGNOSIS — R7989 Other specified abnormal findings of blood chemistry: Secondary | ICD-10-CM | POA: Diagnosis not present

## 2014-07-05 DIAGNOSIS — I2510 Atherosclerotic heart disease of native coronary artery without angina pectoris: Secondary | ICD-10-CM | POA: Diagnosis not present

## 2014-07-05 DIAGNOSIS — R1013 Epigastric pain: Secondary | ICD-10-CM | POA: Diagnosis not present

## 2014-07-05 DIAGNOSIS — Z7982 Long term (current) use of aspirin: Secondary | ICD-10-CM | POA: Diagnosis not present

## 2014-07-05 DIAGNOSIS — K8020 Calculus of gallbladder without cholecystitis without obstruction: Secondary | ICD-10-CM | POA: Diagnosis not present

## 2014-07-06 DIAGNOSIS — K8020 Calculus of gallbladder without cholecystitis without obstruction: Secondary | ICD-10-CM | POA: Diagnosis not present

## 2014-07-06 DIAGNOSIS — I2510 Atherosclerotic heart disease of native coronary artery without angina pectoris: Secondary | ICD-10-CM | POA: Diagnosis not present

## 2014-07-06 DIAGNOSIS — R1013 Epigastric pain: Secondary | ICD-10-CM | POA: Diagnosis not present

## 2014-07-06 DIAGNOSIS — R791 Abnormal coagulation profile: Secondary | ICD-10-CM | POA: Diagnosis not present

## 2014-07-06 DIAGNOSIS — R7989 Other specified abnormal findings of blood chemistry: Secondary | ICD-10-CM | POA: Diagnosis not present

## 2014-07-06 LAB — COMPREHENSIVE METABOLIC PANEL
ALBUMIN: 2.9 g/dL — AB (ref 3.4–5.0)
ALK PHOS: 166 U/L — AB (ref 46–116)
Anion Gap: 9 (ref 7–16)
BILIRUBIN TOTAL: 7.9 mg/dL — AB (ref 0.2–1.0)
BUN: 28 mg/dL — ABNORMAL HIGH (ref 7–18)
CREATININE: 1.52 mg/dL — AB (ref 0.60–1.30)
Calcium, Total: 8.2 mg/dL — ABNORMAL LOW (ref 8.5–10.1)
Chloride: 103 mmol/L (ref 98–107)
Co2: 24 mmol/L (ref 21–32)
EGFR (African American): 57 — ABNORMAL LOW
EGFR (Non-African Amer.): 47 — ABNORMAL LOW
Glucose: 104 mg/dL — ABNORMAL HIGH (ref 65–99)
Osmolality: 278 (ref 275–301)
Potassium: 4.2 mmol/L (ref 3.5–5.1)
SGOT(AST): 129 U/L — ABNORMAL HIGH (ref 15–37)
SGPT (ALT): 262 U/L — ABNORMAL HIGH (ref 14–63)
SODIUM: 136 mmol/L (ref 136–145)
Total Protein: 5.9 g/dL — ABNORMAL LOW (ref 6.4–8.2)

## 2014-07-07 DIAGNOSIS — K8020 Calculus of gallbladder without cholecystitis without obstruction: Secondary | ICD-10-CM | POA: Diagnosis not present

## 2014-07-07 DIAGNOSIS — R791 Abnormal coagulation profile: Secondary | ICD-10-CM | POA: Diagnosis not present

## 2014-07-07 DIAGNOSIS — K8050 Calculus of bile duct without cholangitis or cholecystitis without obstruction: Secondary | ICD-10-CM | POA: Diagnosis not present

## 2014-07-07 DIAGNOSIS — R1013 Epigastric pain: Secondary | ICD-10-CM | POA: Diagnosis not present

## 2014-07-07 DIAGNOSIS — Z7982 Long term (current) use of aspirin: Secondary | ICD-10-CM | POA: Diagnosis not present

## 2014-07-07 DIAGNOSIS — I2510 Atherosclerotic heart disease of native coronary artery without angina pectoris: Secondary | ICD-10-CM | POA: Diagnosis not present

## 2014-07-07 DIAGNOSIS — R7989 Other specified abnormal findings of blood chemistry: Secondary | ICD-10-CM | POA: Diagnosis not present

## 2014-07-07 LAB — COMPREHENSIVE METABOLIC PANEL
ALBUMIN: 2.8 g/dL — AB (ref 3.4–5.0)
ALK PHOS: 202 U/L — AB (ref 46–116)
AST: 81 U/L — AB (ref 15–37)
Anion Gap: 12 (ref 7–16)
BUN: 17 mg/dL (ref 7–18)
Bilirubin,Total: 6 mg/dL — ABNORMAL HIGH (ref 0.2–1.0)
CALCIUM: 8 mg/dL — AB (ref 8.5–10.1)
CO2: 20 mmol/L — AB (ref 21–32)
Chloride: 105 mmol/L (ref 98–107)
Creatinine: 1.13 mg/dL (ref 0.60–1.30)
EGFR (African American): >60
EGFR (Non-African Amer.): >60
Glucose: 156 mg/dL — ABNORMAL HIGH (ref 65–99)
Osmolality: 279 (ref 275–301)
Potassium: 4.2 mmol/L (ref 3.5–5.1)
SGPT (ALT): 187 U/L — ABNORMAL HIGH (ref 14–63)
SODIUM: 137 mmol/L (ref 136–145)
Total Protein: 6 g/dL — ABNORMAL LOW (ref 6.4–8.2)

## 2014-07-07 LAB — CBC WITH DIFFERENTIAL/PLATELET
BASOS ABS: 0 10*3/uL (ref 0.0–0.1)
Basophil %: 0.3 %
Eosinophil #: 0.1 10*3/uL (ref 0.0–0.7)
Eosinophil %: 0.7 %
HCT: 39 % — AB (ref 40.0–52.0)
HGB: 12.9 g/dL — ABNORMAL LOW (ref 13.0–18.0)
Lymphocyte #: 0.5 10*3/uL — ABNORMAL LOW (ref 1.0–3.6)
Lymphocyte %: 5 %
MCH: 30.2 pg (ref 26.0–34.0)
MCHC: 33.2 g/dL (ref 32.0–36.0)
MCV: 91 fL (ref 80–100)
MONOS PCT: 6.5 %
Monocyte #: 0.6 x10 3/mm (ref 0.2–1.0)
NEUTROS ABS: 8 10*3/uL — AB (ref 1.4–6.5)
NEUTROS PCT: 87.5 %
Platelet: 125 10*3/uL — ABNORMAL LOW (ref 150–440)
RBC: 4.28 10*6/uL — ABNORMAL LOW (ref 4.40–5.90)
RDW: 14 % (ref 11.5–14.5)
WBC: 9.2 10*3/uL (ref 3.8–10.6)

## 2014-07-08 DIAGNOSIS — K5900 Constipation, unspecified: Secondary | ICD-10-CM | POA: Diagnosis not present

## 2014-07-08 DIAGNOSIS — I2510 Atherosclerotic heart disease of native coronary artery without angina pectoris: Secondary | ICD-10-CM | POA: Diagnosis not present

## 2014-07-08 DIAGNOSIS — K8020 Calculus of gallbladder without cholecystitis without obstruction: Secondary | ICD-10-CM | POA: Diagnosis not present

## 2014-07-08 DIAGNOSIS — K810 Acute cholecystitis: Secondary | ICD-10-CM | POA: Diagnosis not present

## 2014-07-08 DIAGNOSIS — R1013 Epigastric pain: Secondary | ICD-10-CM | POA: Diagnosis not present

## 2014-07-08 DIAGNOSIS — Z7982 Long term (current) use of aspirin: Secondary | ICD-10-CM | POA: Diagnosis not present

## 2014-07-08 DIAGNOSIS — R7989 Other specified abnormal findings of blood chemistry: Secondary | ICD-10-CM | POA: Diagnosis not present

## 2014-07-08 LAB — BILIRUBIN, DIRECT: Bilirubin, Direct: 4.4 mg/dL — ABNORMAL HIGH (ref 0.0–0.2)

## 2014-07-08 LAB — COMPREHENSIVE METABOLIC PANEL
Albumin: 2.8 g/dL — ABNORMAL LOW (ref 3.4–5.0)
Alkaline Phosphatase: 268 U/L — ABNORMAL HIGH (ref 46–116)
Anion Gap: 8 (ref 7–16)
BUN: 11 mg/dL (ref 7–18)
Bilirubin,Total: 5.7 mg/dL — ABNORMAL HIGH (ref 0.2–1.0)
CHLORIDE: 105 mmol/L (ref 98–107)
CREATININE: 1.02 mg/dL (ref 0.60–1.30)
Calcium, Total: 7.9 mg/dL — ABNORMAL LOW (ref 8.5–10.1)
Co2: 26 mmol/L (ref 21–32)
EGFR (African American): >60
Glucose: 123 mg/dL — ABNORMAL HIGH (ref 65–99)
Osmolality: 278 (ref 275–301)
Potassium: 3.7 mmol/L (ref 3.5–5.1)
SGOT(AST): 71 U/L — ABNORMAL HIGH (ref 15–37)
SGPT (ALT): 164 U/L — ABNORMAL HIGH (ref 14–63)
Sodium: 139 mmol/L (ref 136–145)
Total Protein: 6.2 g/dL — ABNORMAL LOW (ref 6.4–8.2)

## 2014-07-09 DIAGNOSIS — K5900 Constipation, unspecified: Secondary | ICD-10-CM | POA: Diagnosis not present

## 2014-07-09 DIAGNOSIS — R1013 Epigastric pain: Secondary | ICD-10-CM | POA: Diagnosis not present

## 2014-07-09 DIAGNOSIS — I2510 Atherosclerotic heart disease of native coronary artery without angina pectoris: Secondary | ICD-10-CM | POA: Diagnosis not present

## 2014-07-09 DIAGNOSIS — R7989 Other specified abnormal findings of blood chemistry: Secondary | ICD-10-CM | POA: Diagnosis not present

## 2014-07-09 DIAGNOSIS — K8020 Calculus of gallbladder without cholecystitis without obstruction: Secondary | ICD-10-CM | POA: Diagnosis not present

## 2014-07-09 DIAGNOSIS — K810 Acute cholecystitis: Secondary | ICD-10-CM | POA: Diagnosis not present

## 2014-07-10 ENCOUNTER — Inpatient Hospital Stay: Payer: Self-pay | Admitting: Internal Medicine

## 2014-07-10 DIAGNOSIS — E785 Hyperlipidemia, unspecified: Secondary | ICD-10-CM | POA: Diagnosis present

## 2014-07-10 DIAGNOSIS — K8020 Calculus of gallbladder without cholecystitis without obstruction: Secondary | ICD-10-CM | POA: Diagnosis not present

## 2014-07-10 DIAGNOSIS — Z955 Presence of coronary angioplasty implant and graft: Secondary | ICD-10-CM | POA: Diagnosis not present

## 2014-07-10 DIAGNOSIS — K8070 Calculus of gallbladder and bile duct without cholecystitis without obstruction: Secondary | ICD-10-CM | POA: Diagnosis not present

## 2014-07-10 DIAGNOSIS — K5080 Crohn's disease of both small and large intestine without complications: Secondary | ICD-10-CM | POA: Diagnosis not present

## 2014-07-10 DIAGNOSIS — Z7982 Long term (current) use of aspirin: Secondary | ICD-10-CM | POA: Diagnosis not present

## 2014-07-10 DIAGNOSIS — K8050 Calculus of bile duct without cholangitis or cholecystitis without obstruction: Secondary | ICD-10-CM | POA: Diagnosis not present

## 2014-07-10 DIAGNOSIS — Z79899 Other long term (current) drug therapy: Secondary | ICD-10-CM | POA: Diagnosis not present

## 2014-07-10 DIAGNOSIS — K219 Gastro-esophageal reflux disease without esophagitis: Secondary | ICD-10-CM | POA: Diagnosis present

## 2014-07-10 DIAGNOSIS — K810 Acute cholecystitis: Secondary | ICD-10-CM | POA: Diagnosis not present

## 2014-07-10 DIAGNOSIS — K769 Liver disease, unspecified: Secondary | ICD-10-CM | POA: Diagnosis present

## 2014-07-10 DIAGNOSIS — Z7902 Long term (current) use of antithrombotics/antiplatelets: Secondary | ICD-10-CM | POA: Diagnosis not present

## 2014-07-10 DIAGNOSIS — K279 Peptic ulcer, site unspecified, unspecified as acute or chronic, without hemorrhage or perforation: Secondary | ICD-10-CM | POA: Diagnosis not present

## 2014-07-10 DIAGNOSIS — I2510 Atherosclerotic heart disease of native coronary artery without angina pectoris: Secondary | ICD-10-CM | POA: Diagnosis not present

## 2014-07-10 DIAGNOSIS — N400 Enlarged prostate without lower urinary tract symptoms: Secondary | ICD-10-CM | POA: Diagnosis present

## 2014-07-10 DIAGNOSIS — K5900 Constipation, unspecified: Secondary | ICD-10-CM | POA: Diagnosis not present

## 2014-07-10 DIAGNOSIS — Z96653 Presence of artificial knee joint, bilateral: Secondary | ICD-10-CM | POA: Diagnosis present

## 2014-07-10 DIAGNOSIS — R7989 Other specified abnormal findings of blood chemistry: Secondary | ICD-10-CM | POA: Diagnosis not present

## 2014-07-10 DIAGNOSIS — R1084 Generalized abdominal pain: Secondary | ICD-10-CM | POA: Diagnosis not present

## 2014-07-10 DIAGNOSIS — K8046 Calculus of bile duct with acute and chronic cholecystitis without obstruction: Secondary | ICD-10-CM | POA: Diagnosis not present

## 2014-07-10 DIAGNOSIS — N281 Cyst of kidney, acquired: Secondary | ICD-10-CM | POA: Diagnosis present

## 2014-07-10 DIAGNOSIS — I10 Essential (primary) hypertension: Secondary | ICD-10-CM | POA: Diagnosis present

## 2014-07-10 DIAGNOSIS — R1013 Epigastric pain: Secondary | ICD-10-CM | POA: Diagnosis not present

## 2014-07-11 DIAGNOSIS — I2510 Atherosclerotic heart disease of native coronary artery without angina pectoris: Secondary | ICD-10-CM | POA: Diagnosis not present

## 2014-07-11 DIAGNOSIS — K5900 Constipation, unspecified: Secondary | ICD-10-CM | POA: Diagnosis not present

## 2014-07-11 DIAGNOSIS — K8020 Calculus of gallbladder without cholecystitis without obstruction: Secondary | ICD-10-CM | POA: Diagnosis not present

## 2014-07-11 DIAGNOSIS — R7989 Other specified abnormal findings of blood chemistry: Secondary | ICD-10-CM | POA: Diagnosis not present

## 2014-07-11 DIAGNOSIS — R1013 Epigastric pain: Secondary | ICD-10-CM | POA: Diagnosis not present

## 2014-07-12 DIAGNOSIS — K8020 Calculus of gallbladder without cholecystitis without obstruction: Secondary | ICD-10-CM | POA: Diagnosis not present

## 2014-07-12 DIAGNOSIS — R7989 Other specified abnormal findings of blood chemistry: Secondary | ICD-10-CM | POA: Diagnosis not present

## 2014-07-12 DIAGNOSIS — K5900 Constipation, unspecified: Secondary | ICD-10-CM | POA: Diagnosis not present

## 2014-07-12 DIAGNOSIS — I2510 Atherosclerotic heart disease of native coronary artery without angina pectoris: Secondary | ICD-10-CM | POA: Diagnosis not present

## 2014-07-12 DIAGNOSIS — R1013 Epigastric pain: Secondary | ICD-10-CM | POA: Diagnosis not present

## 2014-07-22 ENCOUNTER — Ambulatory Visit: Payer: Self-pay | Admitting: Surgery

## 2014-07-22 DIAGNOSIS — R531 Weakness: Secondary | ICD-10-CM | POA: Diagnosis not present

## 2014-07-22 DIAGNOSIS — J9811 Atelectasis: Secondary | ICD-10-CM | POA: Diagnosis not present

## 2014-07-22 DIAGNOSIS — J984 Other disorders of lung: Secondary | ICD-10-CM | POA: Diagnosis not present

## 2014-07-24 ENCOUNTER — Ambulatory Visit: Payer: Self-pay | Admitting: Surgery

## 2014-07-24 DIAGNOSIS — R531 Weakness: Secondary | ICD-10-CM | POA: Diagnosis not present

## 2014-07-26 ENCOUNTER — Other Ambulatory Visit: Payer: Self-pay | Admitting: Cardiovascular Disease

## 2014-08-26 DIAGNOSIS — K7589 Other specified inflammatory liver diseases: Secondary | ICD-10-CM | POA: Diagnosis not present

## 2014-08-26 DIAGNOSIS — I2510 Atherosclerotic heart disease of native coronary artery without angina pectoris: Secondary | ICD-10-CM | POA: Diagnosis not present

## 2014-08-26 DIAGNOSIS — N400 Enlarged prostate without lower urinary tract symptoms: Secondary | ICD-10-CM | POA: Diagnosis not present

## 2014-08-26 DIAGNOSIS — Z23 Encounter for immunization: Secondary | ICD-10-CM | POA: Diagnosis not present

## 2014-09-02 DIAGNOSIS — I10 Essential (primary) hypertension: Secondary | ICD-10-CM | POA: Diagnosis not present

## 2014-09-02 DIAGNOSIS — E785 Hyperlipidemia, unspecified: Secondary | ICD-10-CM | POA: Diagnosis not present

## 2014-09-02 DIAGNOSIS — I2510 Atherosclerotic heart disease of native coronary artery without angina pectoris: Secondary | ICD-10-CM | POA: Diagnosis not present

## 2014-09-02 DIAGNOSIS — E041 Nontoxic single thyroid nodule: Secondary | ICD-10-CM | POA: Diagnosis not present

## 2014-09-16 ENCOUNTER — Observation Stay
Admit: 2014-09-16 | Disposition: A | Discharge status: Other Acute Inpatient Hospital | Payer: Self-pay | Attending: Internal Medicine | Admitting: Internal Medicine

## 2014-09-16 DIAGNOSIS — K219 Gastro-esophageal reflux disease without esophagitis: Secondary | ICD-10-CM | POA: Diagnosis not present

## 2014-09-16 DIAGNOSIS — M1990 Unspecified osteoarthritis, unspecified site: Secondary | ICD-10-CM | POA: Diagnosis not present

## 2014-09-16 DIAGNOSIS — I10 Essential (primary) hypertension: Secondary | ICD-10-CM | POA: Diagnosis not present

## 2014-09-16 DIAGNOSIS — I209 Angina pectoris, unspecified: Secondary | ICD-10-CM | POA: Diagnosis not present

## 2014-09-16 DIAGNOSIS — E785 Hyperlipidemia, unspecified: Secondary | ICD-10-CM | POA: Diagnosis not present

## 2014-09-16 DIAGNOSIS — I25119 Atherosclerotic heart disease of native coronary artery with unspecified angina pectoris: Secondary | ICD-10-CM | POA: Diagnosis not present

## 2014-09-16 DIAGNOSIS — Z96653 Presence of artificial knee joint, bilateral: Secondary | ICD-10-CM | POA: Diagnosis not present

## 2014-09-16 DIAGNOSIS — R61 Generalized hyperhidrosis: Secondary | ICD-10-CM | POA: Diagnosis not present

## 2014-09-16 DIAGNOSIS — I289 Disease of pulmonary vessels, unspecified: Secondary | ICD-10-CM | POA: Diagnosis not present

## 2014-09-16 DIAGNOSIS — R079 Chest pain, unspecified: Secondary | ICD-10-CM | POA: Diagnosis not present

## 2014-09-16 DIAGNOSIS — Z7902 Long term (current) use of antithrombotics/antiplatelets: Secondary | ICD-10-CM | POA: Diagnosis not present

## 2014-09-16 DIAGNOSIS — R0789 Other chest pain: Secondary | ICD-10-CM | POA: Diagnosis not present

## 2014-09-16 DIAGNOSIS — H9190 Unspecified hearing loss, unspecified ear: Secondary | ICD-10-CM | POA: Diagnosis not present

## 2014-09-16 DIAGNOSIS — Z955 Presence of coronary angioplasty implant and graft: Secondary | ICD-10-CM | POA: Diagnosis not present

## 2014-09-16 DIAGNOSIS — I739 Peripheral vascular disease, unspecified: Secondary | ICD-10-CM | POA: Diagnosis not present

## 2014-09-16 DIAGNOSIS — Z7982 Long term (current) use of aspirin: Secondary | ICD-10-CM | POA: Diagnosis not present

## 2014-09-16 DIAGNOSIS — I340 Nonrheumatic mitral (valve) insufficiency: Secondary | ICD-10-CM | POA: Diagnosis not present

## 2014-09-16 LAB — BASIC METABOLIC PANEL
Anion Gap: 5 — ABNORMAL LOW (ref 7–16)
BUN: 17 mg/dL
CALCIUM: 8.9 mg/dL
CO2: 25 mmol/L
CREATININE: 0.85 mg/dL
Chloride: 107 mmol/L
EGFR (African American): >60
Glucose: 199 mg/dL — ABNORMAL HIGH
POTASSIUM: 4.2 mmol/L
SODIUM: 137 mmol/L

## 2014-09-16 LAB — CBC
HCT: 41.8 % (ref 40.0–52.0)
HGB: 14.3 g/dL (ref 13.0–18.0)
MCH: 30.1 pg (ref 26.0–34.0)
MCHC: 34.2 g/dL (ref 32.0–36.0)
MCV: 88 fL (ref 80–100)
PLATELETS: 155 10*3/uL (ref 150–440)
RBC: 4.75 10*6/uL (ref 4.40–5.90)
RDW: 15.4 % — ABNORMAL HIGH (ref 11.5–14.5)
WBC: 8.1 10*3/uL (ref 3.8–10.6)

## 2014-09-16 LAB — TROPONIN I

## 2014-09-16 LAB — CK TOTAL AND CKMB (NOT AT ARMC)
CK, TOTAL: 29 U/L — AB
CK-MB: 1.6 ng/mL

## 2014-09-16 LAB — CK-MB: CK-MB: 1.5 ng/mL

## 2014-09-17 ENCOUNTER — Encounter: Payer: Self-pay | Admitting: Cardiovascular Disease

## 2014-09-17 ENCOUNTER — Inpatient Hospital Stay (HOSPITAL_COMMUNITY)
Admission: AD | Admit: 2014-09-17 | Discharge: 2014-09-26 | DRG: 235 | Disposition: A | Discharge status: Home Health | Payer: Medicare Other | Source: Other Acute Inpatient Hospital | Attending: Thoracic Surgery (Cardiothoracic Vascular Surgery) | Admitting: Thoracic Surgery (Cardiothoracic Vascular Surgery)

## 2014-09-17 ENCOUNTER — Encounter: Payer: Self-pay | Admitting: Physician Assistant

## 2014-09-17 DIAGNOSIS — E782 Mixed hyperlipidemia: Secondary | ICD-10-CM | POA: Diagnosis present

## 2014-09-17 DIAGNOSIS — J449 Chronic obstructive pulmonary disease, unspecified: Secondary | ICD-10-CM | POA: Diagnosis present

## 2014-09-17 DIAGNOSIS — I739 Peripheral vascular disease, unspecified: Secondary | ICD-10-CM | POA: Diagnosis present

## 2014-09-17 DIAGNOSIS — I25110 Atherosclerotic heart disease of native coronary artery with unstable angina pectoris: Secondary | ICD-10-CM | POA: Diagnosis not present

## 2014-09-17 DIAGNOSIS — D696 Thrombocytopenia, unspecified: Secondary | ICD-10-CM | POA: Diagnosis not present

## 2014-09-17 DIAGNOSIS — Z7982 Long term (current) use of aspirin: Secondary | ICD-10-CM

## 2014-09-17 DIAGNOSIS — Z955 Presence of coronary angioplasty implant and graft: Secondary | ICD-10-CM

## 2014-09-17 DIAGNOSIS — J9811 Atelectasis: Secondary | ICD-10-CM

## 2014-09-17 DIAGNOSIS — I4891 Unspecified atrial fibrillation: Secondary | ICD-10-CM | POA: Diagnosis not present

## 2014-09-17 DIAGNOSIS — I200 Unstable angina: Secondary | ICD-10-CM | POA: Diagnosis present

## 2014-09-17 DIAGNOSIS — R0602 Shortness of breath: Secondary | ICD-10-CM | POA: Diagnosis not present

## 2014-09-17 DIAGNOSIS — I10 Essential (primary) hypertension: Secondary | ICD-10-CM | POA: Diagnosis not present

## 2014-09-17 DIAGNOSIS — I6523 Occlusion and stenosis of bilateral carotid arteries: Secondary | ICD-10-CM | POA: Diagnosis not present

## 2014-09-17 DIAGNOSIS — R7309 Other abnormal glucose: Secondary | ICD-10-CM | POA: Diagnosis present

## 2014-09-17 DIAGNOSIS — Z79899 Other long term (current) drug therapy: Secondary | ICD-10-CM

## 2014-09-17 DIAGNOSIS — J90 Pleural effusion, not elsewhere classified: Secondary | ICD-10-CM | POA: Diagnosis not present

## 2014-09-17 DIAGNOSIS — Z87891 Personal history of nicotine dependence: Secondary | ICD-10-CM

## 2014-09-17 DIAGNOSIS — K219 Gastro-esophageal reflux disease without esophagitis: Secondary | ICD-10-CM | POA: Diagnosis not present

## 2014-09-17 DIAGNOSIS — I214 Non-ST elevation (NSTEMI) myocardial infarction: Secondary | ICD-10-CM | POA: Diagnosis not present

## 2014-09-17 DIAGNOSIS — I25119 Atherosclerotic heart disease of native coronary artery with unspecified angina pectoris: Secondary | ICD-10-CM | POA: Diagnosis not present

## 2014-09-17 DIAGNOSIS — J984 Other disorders of lung: Secondary | ICD-10-CM | POA: Diagnosis not present

## 2014-09-17 DIAGNOSIS — D62 Acute posthemorrhagic anemia: Secondary | ICD-10-CM | POA: Diagnosis not present

## 2014-09-17 DIAGNOSIS — E876 Hypokalemia: Secondary | ICD-10-CM | POA: Diagnosis not present

## 2014-09-17 DIAGNOSIS — Z96651 Presence of right artificial knee joint: Secondary | ICD-10-CM | POA: Diagnosis present

## 2014-09-17 DIAGNOSIS — I517 Cardiomegaly: Secondary | ICD-10-CM | POA: Diagnosis not present

## 2014-09-17 DIAGNOSIS — Z7902 Long term (current) use of antithrombotics/antiplatelets: Secondary | ICD-10-CM | POA: Diagnosis not present

## 2014-09-17 DIAGNOSIS — J811 Chronic pulmonary edema: Secondary | ICD-10-CM | POA: Diagnosis not present

## 2014-09-17 DIAGNOSIS — I209 Angina pectoris, unspecified: Secondary | ICD-10-CM | POA: Diagnosis not present

## 2014-09-17 DIAGNOSIS — M1990 Unspecified osteoarthritis, unspecified site: Secondary | ICD-10-CM | POA: Diagnosis not present

## 2014-09-17 DIAGNOSIS — I2510 Atherosclerotic heart disease of native coronary artery without angina pectoris: Secondary | ICD-10-CM

## 2014-09-17 DIAGNOSIS — R079 Chest pain, unspecified: Secondary | ICD-10-CM | POA: Diagnosis not present

## 2014-09-17 DIAGNOSIS — Z4682 Encounter for fitting and adjustment of non-vascular catheter: Secondary | ICD-10-CM | POA: Diagnosis not present

## 2014-09-17 DIAGNOSIS — R0902 Hypoxemia: Secondary | ICD-10-CM

## 2014-09-17 DIAGNOSIS — Z951 Presence of aortocoronary bypass graft: Secondary | ICD-10-CM | POA: Diagnosis not present

## 2014-09-17 DIAGNOSIS — R7303 Prediabetes: Secondary | ICD-10-CM | POA: Diagnosis present

## 2014-09-17 DIAGNOSIS — E785 Hyperlipidemia, unspecified: Secondary | ICD-10-CM | POA: Diagnosis not present

## 2014-09-17 DIAGNOSIS — Z452 Encounter for adjustment and management of vascular access device: Secondary | ICD-10-CM | POA: Diagnosis not present

## 2014-09-17 HISTORY — DX: Occlusion and stenosis of unspecified carotid artery: I65.29

## 2014-09-17 HISTORY — DX: Gastro-esophageal reflux disease without esophagitis: K21.9

## 2014-09-17 HISTORY — DX: Peripheral vascular disease, unspecified: I73.9

## 2014-09-17 LAB — LIPID PANEL
Cholesterol: 121 mg/dL
HDL Cholesterol: 33 mg/dL — ABNORMAL LOW
Ldl Cholesterol, Calc: 62 mg/dL
TRIGLYCERIDES: 129 mg/dL
VLDL Cholesterol, Calc: 26 mg/dL

## 2014-09-17 LAB — CBC WITH DIFFERENTIAL/PLATELET
BASOS ABS: 0.1 10*3/uL (ref 0.0–0.1)
Basophil %: 0.8 %
Eosinophil #: 0.1 10*3/uL (ref 0.0–0.7)
Eosinophil %: 1.7 %
HCT: 37.8 % — ABNORMAL LOW (ref 40.0–52.0)
HGB: 12.8 g/dL — ABNORMAL LOW (ref 13.0–18.0)
LYMPHS ABS: 1.6 10*3/uL (ref 1.0–3.6)
Lymphocyte %: 20.5 %
MCH: 29.9 pg (ref 26.0–34.0)
MCHC: 34 g/dL (ref 32.0–36.0)
MCV: 88 fL (ref 80–100)
MONO ABS: 0.7 x10 3/mm (ref 0.2–1.0)
MONOS PCT: 9.2 %
NEUTROS ABS: 5.3 10*3/uL (ref 1.4–6.5)
Neutrophil %: 67.8 %
Platelet: 143 10*3/uL — ABNORMAL LOW (ref 150–440)
RBC: 4.29 10*6/uL — ABNORMAL LOW (ref 4.40–5.90)
RDW: 15.7 % — AB (ref 11.5–14.5)
WBC: 7.8 10*3/uL (ref 3.8–10.6)

## 2014-09-17 LAB — BASIC METABOLIC PANEL
ANION GAP: 0 — AB (ref 7–16)
BUN: 18 mg/dL
CALCIUM: 8.2 mg/dL — AB
Chloride: 106 mmol/L
Co2: 27 mmol/L
Creatinine: 0.86 mg/dL
EGFR (African American): >60
EGFR (Non-African Amer.): >60
Glucose: 160 mg/dL — ABNORMAL HIGH
Potassium: 4 mmol/L
Sodium: 133 mmol/L — ABNORMAL LOW

## 2014-09-17 LAB — MRSA PCR SCREENING: MRSA BY PCR: NEGATIVE

## 2014-09-17 MED ORDER — CLOPIDOGREL BISULFATE 75 MG PO TABS
75.0000 mg | ORAL_TABLET | Freq: Every day | ORAL | Status: DC
  Filled 2014-09-17 (×2): qty 1

## 2014-09-17 MED ORDER — ACETAMINOPHEN 325 MG PO TABS: 650.0000 mg | ORAL_TABLET | ORAL | Status: DC | PRN

## 2014-09-17 MED ORDER — METOPROLOL TARTRATE 1 MG/ML IV SOLN
INTRAVENOUS | Status: AC
  Administered 2014-09-18: 5 mg
  Filled 2014-09-17: qty 5

## 2014-09-17 MED ORDER — NITROGLYCERIN 0.4 MG SL SUBL
0.4000 mg | SUBLINGUAL_TABLET | SUBLINGUAL | Status: DC | PRN
  Administered 2014-09-17 – 2014-09-19 (×10): 0.4 mg via SUBLINGUAL
  Filled 2014-09-17: qty 1

## 2014-09-17 MED ORDER — AMLODIPINE BESYLATE 5 MG PO TABS: 5.0000 mg | ORAL_TABLET | Freq: Every day | ORAL | Status: DC

## 2014-09-17 MED ORDER — ASPIRIN EC 81 MG PO TBEC
81.0000 mg | DELAYED_RELEASE_TABLET | Freq: Every day | ORAL | Status: DC
  Administered 2014-09-18 – 2014-09-19 (×2): 81 mg via ORAL
  Filled 2014-09-17 (×3): qty 1

## 2014-09-17 MED ORDER — METOPROLOL TARTRATE 1 MG/ML IV SOLN
INTRAVENOUS | Status: AC
  Administered 2014-09-17: 5 mg
  Filled 2014-09-17: qty 5

## 2014-09-17 MED ORDER — OMEGA-3-ACID ETHYL ESTERS 1 G PO CAPS
1.0000 g | ORAL_CAPSULE | Freq: Every day | ORAL | Status: DC
  Administered 2014-09-17 – 2014-09-18 (×2): 1 g via ORAL
  Filled 2014-09-17 (×4): qty 1

## 2014-09-17 MED ORDER — FAMOTIDINE 20 MG PO TABS
20.0000 mg | ORAL_TABLET | Freq: Two times a day (BID) | ORAL | Status: DC
  Administered 2014-09-17 – 2014-09-19 (×5): 20 mg via ORAL
  Filled 2014-09-17 (×7): qty 1

## 2014-09-17 MED ORDER — NITROGLYCERIN 0.4 MG SL SUBL: 0.4000 mg | SUBLINGUAL_TABLET | SUBLINGUAL | Status: DC | PRN

## 2014-09-17 MED ORDER — PANTOPRAZOLE SODIUM 40 MG PO TBEC: 40.0000 mg | DELAYED_RELEASE_TABLET | Freq: Every day | ORAL | Status: DC

## 2014-09-17 MED ORDER — LEVOBUNOLOL HCL 0.5 % OP SOLN
1.0000 [drp] | Freq: Every day | OPHTHALMIC | Status: DC
  Administered 2014-09-18 – 2014-09-26 (×8): 1 [drp] via OPHTHALMIC
  Filled 2014-09-17: qty 5

## 2014-09-17 MED ORDER — OXYCODONE HCL 5 MG PO TABS
5.0000 mg | ORAL_TABLET | ORAL | Status: DC | PRN
  Administered 2014-09-19: 5 mg via ORAL
  Filled 2014-09-17 (×2): qty 1

## 2014-09-17 MED ORDER — METOPROLOL TARTRATE 12.5 MG HALF TABLET
12.5000 mg | ORAL_TABLET | Freq: Two times a day (BID) | ORAL | Status: DC
  Administered 2014-09-17 – 2014-09-19 (×5): 12.5 mg via ORAL
  Filled 2014-09-17 (×8): qty 1

## 2014-09-17 MED ORDER — ATORVASTATIN CALCIUM 20 MG PO TABS
20.0000 mg | ORAL_TABLET | Freq: Every day | ORAL | Status: DC
  Administered 2014-09-18 – 2014-09-25 (×7): 20 mg via ORAL
  Filled 2014-09-17 (×9): qty 1

## 2014-09-17 MED ORDER — DOCUSATE SODIUM 100 MG PO CAPS
100.0000 mg | ORAL_CAPSULE | Freq: Two times a day (BID) | ORAL | Status: DC
  Administered 2014-09-17 – 2014-09-19 (×5): 100 mg via ORAL
  Filled 2014-09-17 (×5): qty 1

## 2014-09-17 MED ORDER — MORPHINE SULFATE 2 MG/ML IJ SOLN
2.0000 mg | INTRAMUSCULAR | Status: DC | PRN
  Administered 2014-09-17: 2 mg via INTRAVENOUS
  Filled 2014-09-17: qty 1

## 2014-09-17 MED ORDER — NITROGLYCERIN 2 % TD OINT
1.0000 [in_us] | TOPICAL_OINTMENT | Freq: Four times a day (QID) | TRANSDERMAL | Status: DC
  Administered 2014-09-17 – 2014-09-18 (×3): 1 [in_us] via TOPICAL
  Filled 2014-09-17: qty 30

## 2014-09-17 MED ORDER — ONDANSETRON HCL 4 MG/2ML IJ SOLN: 4.0000 mg | Freq: Four times a day (QID) | INTRAMUSCULAR | Status: DC | PRN

## 2014-09-17 MED ORDER — HEPARIN (PORCINE) IN NACL 100-0.45 UNIT/ML-% IJ SOLN
1500.0000 [IU]/h | INTRAMUSCULAR | Status: DC
  Administered 2014-09-17: 1300 [IU]/h via INTRAVENOUS
  Administered 2014-09-18 – 2014-09-20 (×3): 1500 [IU]/h via INTRAVENOUS
  Filled 2014-09-17 (×5): qty 250

## 2014-09-17 MED ORDER — BENAZEPRIL HCL 20 MG PO TABS
20.0000 mg | ORAL_TABLET | Freq: Every day | ORAL | Status: DC
  Administered 2014-09-18 – 2014-09-19 (×2): 20 mg via ORAL
  Filled 2014-09-17 (×2): qty 1

## 2014-09-17 NOTE — Progress Notes (Signed)
Stopped by to check on pt, he is doing fine. No complaints or issues, no chest pain or SOB. Home meds are ordered.   D/c'd Plavix as he is for consideration of CABG, on heparin. Pain-free. VSS. Continue to monitor. See in am.  Rosaria Ferries, PA-C 09/17/2014 8:24 PM Beeper [phone]

## 2014-09-17 NOTE — H&P (Signed)
History and Physical  Patient ID: TOVIA KISNER MRN: [id number], DOB: 04/26/1932 Date of Encounter: 09/17/2014, 3:16 PM Primary Physician: Adrian Prows, MD Primary Cardiologist: Dr. Rockey Situ, MD  Chief Complaint: 2 day history of worsening intermittent chest pain Reason for Admission: Unstable angina   HPI: 81 year old male with history of CAD s/p PCI/DES to mLAD and dRCA in 2005 in the setting of [place], HTN, HLD, PVD, GERD, intermittent dizziness, arthritis s/p right knee replacement who presented to [facility] on 4/18 with 2 day history of worsening intermittent chest pain. Details of cardiac in 2005 showed 90% mid LAD, 80% distal RCA, 50% OM1 at the mid vessel, Taxus stent 2.75 x 24 mm stent to his LAD, Taxus stent 2.5 x 16 mm stent to the RCA. He had follow up nuclear stress in October 2009 showing no ischemia. Again, with repeat nuclear stress testing in the setting of knee replacement surgery in July 2011 that showed mild ischemia in the inferior wall consistent with previous stress test. History of prior dizziness that resolved without intervention. He has history of no significant aortic disease, mild carotid plaquing with mixed irregular plaque estimated at 40% on the right. He continues to care for his wife at home who has Alzheimer's disease. There is significant stress at home.   On 09/14/2014 he was out in his yard picking up limbs and developed chest pain that lasted for a couple of hours and resolved with rest. The pain returned later that night. No associated symptoms. He has been having increased chest pain at home, clutching his chest at times stating "my chest." Has not sought medical care until now. He recently underwent cholecystectomy. Still with symptoms. While he was using a crane to load a truck on 4/18 he developed "13/10" chest pain. States this was the worst chest pain he has had. He wanted to call out office, but could not take the pain and presented to the ED. No associated  symptoms. Has continued to have intermittent chest pain, none currently. Of note, while he was loading the truck on 4/18 he was using a crane and did not exert himself. He was using a joystick.   Upon his arrival to [facility] ED he was found to have troponin negative x 3, EKG showed NSR, no st/t changes. CXR non-acute. Echo showed normal LV function, mild MR, high normal RVSP. He was scheduled to undergo cardiac cath this afternoon. He did not want a stress test. Cardiac showed critical proximal LCx and ostial/proximal LAD disease, estimated at 90 to 95%. RCA with mild diffuse disease. Normal EF estimated at >55%, no WMA. No significant MR or AS. Case was discussed with Dr. Fletcher Anon who recommended evaluation [facility] for [facility] CABG.    Past Medical History  Diagnosis Date  . Coronary artery disease     a. PCI/DES to mLAD and dRCA in 2005, residual OM1 50%; b. nuclear stress test 02/2008: no ischemia; c. nuclear stress test 11/2009: mild ischemia in the inferior wall c/w previous stress test; d. cath 09/17/2014: critical ostial/prox LAD estimated at 90-95%, critical prox LCx, RCA w/ mild diff dz, EF >55%, no WMA  . Hyperlipidemia   . Hypertension   . Carotid artery stenosis     a. RICA 40%  . GERD (gastroesophageal reflux disease)   . PVD (peripheral vascular disease)      Most Recent Cardiac Studies: Cardiac cath 09/17/2014:  SUMMARY:   -Summary: Critical proximal LCX and ostial/proximal LAD disease,  estimated  at 90 to 95%. RCA with mild diffuse disease. Normal EF estimated at >55%, no WMA. no significant MR or AS.   Case discussed with Dr. Fletcher Anon, would recommend evaluation at [facility] for CABG   CORONARY CIRCULATION: Ostial LAD: There was a diffuse 90 % stenosis. The lesion was complex. Proximal LAD: There was a diffuse 90 % stenosis at the site of a prior stent. The lesion was complex. Proximal circumflex: There was a diffuse 95 % stenosis. 1st obtuse marginal: There was a diffuse 40 %  stenosis. RCA: The vessel was large sized. Angiography showed mild atherosclerosis.   Echo 09/16/2014:  1. Left ventricular ejection fraction, by visual estimation, is 60 to 65%.  2. Normal global left ventricular systolic function.  3. Normal right ventricular size and systolic function.  4. Mildly dilated left atrium.  5. Mild mitral valve regurgitation.  6. High normal RVSP     Surgical History:  Past Surgical History  Procedure Laterality Date  . Cardiac catheterization    . Knee surgery      right     Home Meds: Prior to Admission medications   Medication Sig Start Date End Date Taking? Authorizing Provider  amLODipine-benazepril (LOTREL) 10-20 MG per capsule TAKE 1 CAPSULE BY MOUTH DAILY. 04/01/14   Minna Merritts, MD  aspirin 81 MG EC tablet Take 81 mg by mouth daily.      Historical Provider, MD  atorvastatin (LIPITOR) 20 MG tablet Take 20 mg by mouth daily.    Historical Provider, MD  clopidogrel (PLAVIX) 75 MG tablet TAKE 1 TABLET BY MOUTH EVERY DAY 03/18/14   Minna Merritts, MD  metoprolol tartrate (LOPRESSOR) 25 MG tablet Take 0.5 tablets (12.5 mg total) by mouth 2 (two) times daily. 11/16/11   Minna Merritts, MD  metoprolol tartrate (LOPRESSOR) 25 MG tablet TAKE 1/2 TABLET BY MOUTH TWICE A DAY 07/26/14   Minna Merritts, MD  Omega-3 Fatty Acids (FISH OIL) 1200 MG CAPS Take 1,200 mg by mouth daily.    Historical Provider, MD  omeprazole (PRILOSEC) 20 MG capsule TAKE 1 CAPSULE BY MOUTH 2 TIMES DAILY. 01/14/14   Minna Merritts, MD    Allergies: No Known Allergies  History   Social History  . Marital Status: Married    Spouse Name: N/A  . Number of Children: N/A  . Years of Education: N/A   Occupational History  . Not on file.   Social History Main Topics  . Smoking status: Former Research scientist (life sciences)  . Smokeless tobacco: Never Used  . Alcohol Use: No  . Drug Use: No  . Sexual Activity: Not on file   Other Topics Concern  . Not on file   Social History  Narrative     Family History  Problem Relation Age of Onset  . Heart failure Mother     Review of Systems: General: negative for positive for fatigue. chills, fever, night sweats or weight changes.  Cardiovascular: positive for chest tightness - resolved. negative for edema, orthopnea, palpitations, paroxysmal nocturnal dyspnea, shortness of breath or dyspnea on exertion Dermatological: negative for rash Respiratory: negative for cough or wheezing Urologic: negative for hematuria Abdominal: negative for nausea, vomiting, diarrhea, bright red blood per rectum, melena, or hematemesis Neurologic: negative for visual changes, syncope, or dizziness All other systems reviewed and are otherwise negative except as noted above.  Labs:  No results found for: WBC, HGB, HCT, MCV, PLT No results for input(s): NA, K, CL, CO2, BUN, CREATININE, CALCIUM, PROT,  BILITOT, ALKPHOS, ALT, AST, GLUCOSE in the last 168 hours.  Invalid input(s): LABALBU No results for input(s): CKTOTAL, CKMB, TROPONINI in the last 72 hours. Lab Results  Component Value Date   CHOL 149 06/02/2010   HDL 34* 06/02/2010   LDLCALC 72 06/02/2010   TRIG 217* 06/02/2010    TC: 121 LDL: 62 HDL: 33 TG: 129  Radiology/Studies:  See [facility] packet   EKG: NSR, 66 bpm, no significant st/t changes  Weights: Wt Readings from Last 3 Encounters:  06/12/14 219 lb 8 oz (99.565 kg)  02/22/14 218 lb 4 oz (98.998 kg)  11/12/13 219 lb 8 oz (99.565 kg)     Physical Exam: BP: 124/69, P: 63, T: 98.3, R: 19, Pulse ox: 96% RA General: Well developed, well nourished, in no acute distress. Head: Normocephalic, atraumatic, sclera non-icteric, no xanthomas, nares are without discharge.  Neck: Negative for carotid bruits. JVD not elevated. Lungs: Clear bilaterally to auscultation without wheezes, rales, or rhonchi. Breathing is unlabored. Heart: RRR with S1 S2. No murmurs, rubs, or gallops appreciated. Abdomen: Soft, non-tender,  non-distended with normoactive bowel sounds. No hepatomegaly. No rebound/guarding. No obvious abdominal masses. Msk:  Strength and tone appear normal for age. Extremities: No clubbing or cyanosis. No edema.  Distal pedal pulses are 2+ and equal bilaterally. Neuro: Alert and oriented X 3. No focal deficit. No facial asymmetry. Moves all extremities spontaneously. Psych:  Responds to questions appropriately with a normal affect.    ASSESSMENT AND PLAN:  1. Unstable angina:  -Stuttering chest pain, worsening in nature -Cardiac cath this afternoon showed critical proximal LCx and ostial/proximal LAD disease, estimated at 90 to 95%. RCA with mild diffuse disease. Normal EF estimated at >55%, no WMA.  -Echo showed normal LV function -Continue nitro paste -Restart heparin gtt at 10 PM (22:00) tonight without bolus -Transport to [facility] for evaluation of possible CABG  2. CAD s/p PCI/DES in 2005: -Continue Lopressor 12.5 mg bid, aspirin 81 mg, Plavix 75 mg, Lipitor 20 mg -Cath and transport to [facility] as above   3. HTN: -Continue Lotrel 5/20 mg and Lopressor as above  4. HLD: -LDL at goal as above -Continue Liptior  5. Hyperglycemia: -Check A1C  6. GERD: -Continue Zantac 150 mg bid  7. Recent s/p lap chole, ercp:   Signed, Christell Faith PA-C 09/17/2014, 3:16 PM

## 2014-09-17 NOTE — Significant Event (Signed)
1842-received patient as direct admit from [place] via [place]. Patient is alert/oriented X 4, arrived on 2L [place], two PIVs on left arm, with heparin infusing @ 8cc/hour, nitroglycerin @30mcg , NS @ KVO. Patient denies SOB, or chest pain. Requested to have nasal cannula remove. Notified cardiology on call, PA Barrett of patient's arrival and for orders. Per PA, patient can have a diet and stated she will put in orders.

## 2014-09-17 NOTE — Progress Notes (Signed)
ANTICOAGULATION CONSULT NOTE - Initial Consult  Pharmacy Consult for heparin Indication: chest pain/ACS  No Known Allergies  Patient Measurements: Height: 5\' 11"  (180.3 cm) Weight: 204 lb 9.4 oz (92.8 kg) IBW/kg (Calculated) : 75.3 Heparin Dosing Weight: 93kg  Vital Signs: Temp: 98.2 F (36.8 C) (04/19 1938) Temp Source: Oral (04/19 1938) BP: 97/46 mmHg (04/19 1900) Pulse Rate: 64 (04/19 1900)  Labs: No results for input(s): HGB, HCT, PLT, APTT, LABPROT, INR, HEPARINUNFRC, CREATININE, CKTOTAL, CKMB, TROPONINI in the last 72 hours.  CrCl cannot be calculated (Patient has no serum creatinine result on file.).   Medical History: Past Medical History  Diagnosis Date  . Coronary artery disease     a. PCI/DES to mLAD and dRCA in 2005, residual OM1 50%; b. nuclear stress test 02/2008: no ischemia; c. nuclear stress test 11/2009: mild ischemia in the inferior wall c/w previous stress test; d. cath 09/17/2014: critical ostial/prox LAD estimated at 90-95%, critical prox LCx, RCA w/ mild diff dz, EF >55%, no WMA  . Hyperlipidemia   . Hypertension   . Carotid artery stenosis     a. RICA 40%  . GERD (gastroesophageal reflux disease)   . PVD (peripheral vascular disease)     Medications:  Prescriptions prior to admission  Medication Sig Dispense Refill Last Dose  . clopidogrel (PLAVIX) 75 MG tablet TAKE 1 TABLET BY MOUTH EVERY DAY 90 tablet 3 09/15/2014 at Unknown time  . levobunolol (BETAGAN) 0.5 % ophthalmic solution Place 1 drop into both eyes every morning.  6 09/16/2014 at Unknown time  . Omega-3 Fatty Acids (FISH OIL) 1200 MG CAPS Take 1,200 mg by mouth daily.   09/16/2014 at Unknown time  . omeprazole (PRILOSEC) 20 MG capsule TAKE 1 CAPSULE BY MOUTH 2 TIMES DAILY. (Patient taking differently: TAKE 1 CAPSULE BY MOUTH DAILY.) 180 capsule 3 09/15/2014 at Unknown time  . amLODipine-benazepril (LOTREL) 10-20 MG per capsule TAKE 1 CAPSULE BY MOUTH DAILY. 90 capsule 3 09/15/2014  . aspirin  81 MG EC tablet Take 81 mg by mouth daily.     09/15/2014  . atorvastatin (LIPITOR) 20 MG tablet Take 20 mg by mouth daily.   09/15/2014  . metoprolol tartrate (LOPRESSOR) 25 MG tablet Take 0.5 tablets (12.5 mg total) by mouth 2 (two) times daily. 90 tablet 3 09/15/2014 at 0800  . metoprolol tartrate (LOPRESSOR) 25 MG tablet TAKE 1/2 TABLET BY MOUTH TWICE A DAY (Patient not taking: Reported on 09/17/2014) 90 tablet 2 Not Taking at Unknown time    Assessment: 81 year old man transferred to [facility] from [facility] after cardiac cath today.  Critical CAD found.  To be evaluated for CABG.   Goal of Therapy:  Heparin level 0.3-0.7 units/ml Monitor platelets by anticoagulation protocol: Yes   Plan:  Per MD, will start heparin at 10pm.  Start heparin infusion at 1300 units/hr Check anti-Xa level in 8 hours and daily while on heparin Continue to monitor H&H and platelets  Candie Mile 09/17/2014,8:00 PM

## 2014-09-18 ENCOUNTER — Inpatient Hospital Stay (HOSPITAL_COMMUNITY): Payer: Medicare Other

## 2014-09-18 ENCOUNTER — Encounter (HOSPITAL_COMMUNITY): Payer: Self-pay | Admitting: Thoracic Surgery (Cardiothoracic Vascular Surgery)

## 2014-09-18 ENCOUNTER — Other Ambulatory Visit: Payer: Self-pay | Admitting: *Deleted

## 2014-09-18 DIAGNOSIS — I25110 Atherosclerotic heart disease of native coronary artery with unstable angina pectoris: Secondary | ICD-10-CM

## 2014-09-18 DIAGNOSIS — I2510 Atherosclerotic heart disease of native coronary artery without angina pectoris: Secondary | ICD-10-CM

## 2014-09-18 LAB — T4, FREE: Free T4: 1.26 ng/dL (ref 0.80–1.80)

## 2014-09-18 LAB — SPIROMETRY WITH GRAPH
FEF 25-75 POST: 1.15 L/s
FEF 25-75 PRE: 0.84 L/s
FEF2575-%Change-Post: 36 %
FEF2575-%PRED-PRE: 41 %
FEF2575-%Pred-Post: 56 %
FEV1-%Change-Post: 12 %
FEV1-%PRED-POST: 64 %
FEV1-%Pred-Pre: 56 %
FEV1-POST: 1.9 L
FEV1-Pre: 1.69 L
FEV1FVC-%CHANGE-POST: 8 %
FEV1FVC-%Pred-Pre: 85 %
FEV6-%Change-Post: 4 %
FEV6-%Pred-Post: 72 %
FEV6-%Pred-Pre: 69 %
FEV6-Post: 2.84 L
FEV6-Pre: 2.71 L
FEV6FVC-%CHANGE-POST: 0 %
FEV6FVC-%PRED-POST: 105 %
FEV6FVC-%PRED-PRE: 104 %
FVC-%Change-Post: 3 %
FVC-%PRED-PRE: 67 %
FVC-%Pred-Post: 69 %
FVC-Post: 2.9 L
FVC-Pre: 2.79 L
PRE FEV1/FVC RATIO: 61 %
PRE FEV6/FVC RATIO: 97 %
Post FEV1/FVC ratio: 66 %
Post FEV6/FVC ratio: 98 %

## 2014-09-18 LAB — COMPREHENSIVE METABOLIC PANEL
ALBUMIN: 3.6 g/dL (ref 3.5–5.2)
ALK PHOS: 60 U/L (ref 39–117)
ALT: 22 U/L (ref 0–53)
ANION GAP: 8 (ref 5–15)
AST: 24 U/L (ref 0–37)
BUN: 15 mg/dL (ref 6–23)
CALCIUM: 8.6 mg/dL (ref 8.4–10.5)
CO2: 24 mmol/L (ref 19–32)
Chloride: 104 mmol/L (ref 96–112)
Creatinine, Ser: 0.96 mg/dL (ref 0.50–1.35)
GFR calc non Af Amer: 76 mL/min — ABNORMAL LOW (ref >90)
GFR, EST AFRICAN AMERICAN: 88 mL/min — AB (ref >90)
GLUCOSE: 179 mg/dL — AB (ref 70–99)
POTASSIUM: 4.1 mmol/L (ref 3.5–5.1)
Sodium: 136 mmol/L (ref 135–145)
TOTAL PROTEIN: 6 g/dL (ref 6.0–8.3)
Total Bilirubin: 1.2 mg/dL (ref 0.3–1.2)

## 2014-09-18 LAB — CBC WITH DIFFERENTIAL/PLATELET
Basophils Absolute: 0 10*3/uL (ref 0.0–0.1)
Basophils Relative: 0 % (ref 0–1)
EOS ABS: 0.1 10*3/uL (ref 0.0–0.7)
EOS PCT: 1 % (ref 0–5)
HCT: 37.6 % — ABNORMAL LOW (ref 39.0–52.0)
Hemoglobin: 13 g/dL (ref 13.0–17.0)
LYMPHS PCT: 15 % (ref 12–46)
Lymphs Abs: 1.5 10*3/uL (ref 0.7–4.0)
MCH: 30.4 pg (ref 26.0–34.0)
MCHC: 34.6 g/dL (ref 30.0–36.0)
MCV: 87.9 fL (ref 78.0–100.0)
Monocytes Absolute: 0.9 10*3/uL (ref 0.1–1.0)
Monocytes Relative: 9 % (ref 3–12)
NEUTROS PCT: 75 % (ref 43–77)
Neutro Abs: 7.4 10*3/uL (ref 1.7–7.7)
PLATELETS: 146 10*3/uL — AB (ref 150–400)
RBC: 4.28 MIL/uL (ref 4.22–5.81)
RDW: 14.9 % (ref 11.5–15.5)
WBC: 9.9 10*3/uL (ref 4.0–10.5)

## 2014-09-18 LAB — TSH: TSH: 0.046 u[IU]/mL — AB (ref 0.350–4.500)

## 2014-09-18 LAB — TROPONIN I: Troponin I: 2.63 ng/mL (ref <0.031)

## 2014-09-18 LAB — BRAIN NATRIURETIC PEPTIDE: B Natriuretic Peptide: 218.1 pg/mL — ABNORMAL HIGH (ref 0.0–100.0)

## 2014-09-18 LAB — PLATELET INHIBITION P2Y12: Platelet Function  P2Y12: 242 [PRU] (ref 194–418)

## 2014-09-18 LAB — HEPARIN LEVEL (UNFRACTIONATED)
HEPARIN UNFRACTIONATED: 0.4 [IU]/mL (ref 0.30–0.70)
Heparin Unfractionated: 0.22 IU/mL — ABNORMAL LOW (ref 0.30–0.70)
Heparin Unfractionated: 0.37 IU/mL (ref 0.30–0.70)

## 2014-09-18 LAB — APTT: aPTT: 76 seconds — ABNORMAL HIGH (ref 24–37)

## 2014-09-18 LAB — PROTIME-INR
INR: 1.32 (ref 0.00–1.49)
PROTHROMBIN TIME: 16.5 s — AB (ref 11.6–15.2)

## 2014-09-18 LAB — MAGNESIUM: Magnesium: 1.9 mg/dL (ref 1.5–2.5)

## 2014-09-18 MED ORDER — NITROGLYCERIN 2 % TD OINT
0.5000 [in_us] | TOPICAL_OINTMENT | Freq: Four times a day (QID) | TRANSDERMAL | Status: DC
  Administered 2014-09-18 – 2014-09-19 (×3): 0.5 [in_us] via TOPICAL
  Filled 2014-09-18: qty 30

## 2014-09-18 MED ORDER — DILTIAZEM LOAD VIA INFUSION
10.0000 mg | Freq: Once | INTRAVENOUS | Status: AC
  Administered 2014-09-18: 10 mg via INTRAVENOUS
  Filled 2014-09-18: qty 10

## 2014-09-18 MED ORDER — DILTIAZEM HCL 100 MG IV SOLR
5.0000 mg/h | INTRAVENOUS | Status: DC
  Administered 2014-09-18: 5 mg/h via INTRAVENOUS
  Filled 2014-09-18: qty 100

## 2014-09-18 MED ORDER — ALBUTEROL SULFATE (2.5 MG/3ML) 0.083% IN NEBU
2.5000 mg | INHALATION_SOLUTION | Freq: Once | RESPIRATORY_TRACT | Status: AC
  Administered 2014-09-18: 2.5 mg via RESPIRATORY_TRACT

## 2014-09-18 MED ORDER — MORPHINE SULFATE 2 MG/ML IJ SOLN
1.0000 mg | INTRAMUSCULAR | Status: DC | PRN
Start: 2014-09-18 — End: 2014-09-20
  Administered 2014-09-18 – 2014-09-20 (×4): 1 mg via INTRAVENOUS
  Filled 2014-09-18 (×4): qty 1

## 2014-09-18 NOTE — Progress Notes (Signed)
ANTICOAGULATION CONSULT NOTE - Follow Up Consult  Pharmacy Consult for Heparin Indication: CAD  No Known Allergies  Patient Measurements: Height: 5\' 11"  (180.3 cm) Weight: 207 lb 7.3 oz (94.1 kg) IBW/kg (Calculated) : 75.3 Heparin Dosing Weight: 93 kg  Vital Signs: Temp: 98.3 F (36.8 C) (04/20 1200) Temp Source: Oral (04/20 1200) BP: 115/61 mmHg (04/20 1300) Pulse Rate: 72 (04/20 1300)  Labs:  Recent Labs  09/18/14 0615 09/18/14 1435  HGB 13.0  --   HCT 37.6*  --   PLT 146*  --   APTT 76*  --   LABPROT 16.5*  --   INR 1.32  --   HEPARINUNFRC 0.22* 0.40  CREATININE 0.96  --     Estimated Creatinine Clearance: 71.9 mL/min (by C-G formula based on Cr of 0.96).    Assessment: 81 year old man admitted 09/17/2014 on transfer to [facility] from [facility] after cardiac cath today.  4/19 cath, Critical CAD found. To be evaluated for CABG.s/p Plavix (last 4/19). PRU 242.  Anticoag: IV heparin for critical CAD. Heparin level 0.4 in goal.  Goal of Therapy:  Heparin level 0.3-0.7 units/ml Monitor platelets by anticoagulation protocol: Yes   Plan:  Continue heparin at 1500 units/hr Will recheck HL in 6 hrs to confirm.   Oaklee Sunga S. Alford Highland, PharmD, BCPS Clinical Staff Pharmacist Pager [phone]  Eilene Ghazi Stillinger 09/18/2014,3:39 PM

## 2014-09-18 NOTE — Progress Notes (Signed)
[place] for heparin Indication: chest pain/ACS  No Known Allergies  Patient Measurements: Height: 5\' 11"  (180.3 cm) Weight: 207 lb 7.3 oz (94.1 kg) IBW/kg (Calculated) : 75.3 Heparin Dosing Weight: 93kg  Vital Signs: Temp: 98.1 F (36.7 C) (04/20 0413) Temp Source: Oral (04/20 0413) BP: 105/61 mmHg (04/20 0700) Pulse Rate: 56 (04/20 0700)  Labs:  Recent Labs  09/18/14 0615  HGB 13.0  HCT 37.6*  PLT 146*  APTT 76*  LABPROT 16.5*  INR 1.32  HEPARINUNFRC 0.22*  CREATININE 0.96    Estimated Creatinine Clearance: 71.9 mL/min (by C-G formula based on Cr of 0.96).  Assessment: 81 year old man with CAD s/p cath, awaiting CABG, for heparin    Goal of Therapy:  Heparin level 0.3-0.7 units/ml Monitor platelets by anticoagulation protocol: Yes   Plan:  Increase Heparin 1500 units/hr Check heparin level in 6 hours.  Mollyann Halbert, Bronson Curb 09/18/2014,7:28 AM

## 2014-09-18 NOTE — Progress Notes (Signed)
Called for notification of troponin 2.63. Per H/P, troponins negative at [facility]. He did have an episode of atrial fib with RVR last night associated with significant ST depression, see fellow note. No troponins drawn at that time. His NSTEMI may have occurred after his rapid AF. He is currently chest pain free on heparin. Plan is for CABG tentatively on Friday. Since he is asymptomatic and hemodynamically stable at present time, will continue to cycle troponins and current regimen including heparin, BB, nitrate. Nurse notified to monitor closely and make patient aware to notify us for any recurrent discomfort. D/w Dr. Harl Bowie. Dayna Dunn PA-C

## 2014-09-18 NOTE — Consult Note (Addendum)
Reason for Consult:Severe 3 vessel CAD s/p NSTEMI Referring Physician: Dr. Randa Lynn is an 81 y.o. male.  HPI: 81 yo man sent to [facility] for management of severe 3 vessel CAD after presenting with a cc/o chest pain.  Mr. Reinheimer is an 81 yo man with a history of CAD with DES placed in LAD and RCA in 2005 for unstable angina.   He was in his usual state of health until 4 days ago when he noted chest pain while working in his yard. It was relieved with rest. He did well the following day, but on 4/18 had a sever episode while working to load a truck. This pain was much more severe and did not resolve right away with rest. He went to the ED at [facility] His ECg was unremarkable and he ruled out for MI with troponins x 3.  Cardiac catheterization yesterday showed severe 3 vessel CAD with preserved LV function. He was transferred to [facility]. Last night he had atrial fibrillation with a rapid response and developed CP associated with that. This afternoon he had another episode of CP while walking.  He currently is pain free.  He was on Plavix prior to admission and last received it yesterday.  He has a known 40% RICA stenosis, which is asymptomatic.  Past Medical History  Diagnosis Date  . Coronary artery disease     a. PCI/DES to mLAD and dRCA in 2005, residual OM1 50%; b. nuclear stress test 02/2008: no ischemia; c. nuclear stress test 11/2009: mild ischemia in the inferior wall c/w previous stress test; d. cath 09/17/2014: critical ostial/prox LAD estimated at 90-95%, critical prox LCx, RCA w/ mild diff dz, EF >55%, no WMA  . Hyperlipidemia   . Hypertension   . Carotid artery stenosis     a. RICA 40%  . GERD (gastroesophageal reflux disease)   . PVD (peripheral vascular disease)     Past Surgical History  Procedure Laterality Date  . Cardiac catheterization    . Total knee arthroplasty      bilateral  . Cholecystectomy  2016    Family History  Problem Relation Age of Onset  . Heart  failure Mother     Social History:  reports that he has quit smoking. He has never used smokeless tobacco. He reports that he does not drink alcohol or use illicit drugs.  Allergies: No Known Allergies  Medications:  Scheduled: . aspirin EC  81 mg Oral Daily  . atorvastatin  20 mg Oral q1800  . benazepril  20 mg Oral Daily  . docusate sodium  100 mg Oral BID  . famotidine  20 mg Oral BID  . levobunolol  1 drop Both Eyes Daily  . metoprolol tartrate  12.5 mg Oral BID  . nitroGLYCERIN  0.5 inch Topical 4 times per day  . omega-3 acid ethyl esters  1 g Oral Daily    Results for orders placed or performed during the hospital encounter of 09/17/14 (from the past 48 hour(s))  MRSA PCR Screening     Status: None   Collection Time: 09/17/14  7:00 PM  Result Value Ref Range   MRSA by PCR NEGATIVE NEGATIVE    Comment:        The GeneXpert MRSA Assay (FDA approved for NASAL specimens only), is one component of a comprehensive MRSA colonization surveillance program. It is not intended to diagnose MRSA infection nor to guide or monitor treatment for MRSA infections.   Comprehensive  metabolic panel     Status: Abnormal   Collection Time: 09/18/14  6:15 AM  Result Value Ref Range   Sodium 136 135 - 145 mmol/L   Potassium 4.1 3.5 - 5.1 mmol/L   Chloride 104 96 - 112 mmol/L   CO2 24 19 - 32 mmol/L   Glucose, Bld 179 (H) 70 - 99 mg/dL   BUN 15 6 - 23 mg/dL   Creatinine, Ser 0.96 0.50 - 1.35 mg/dL   Calcium 8.6 8.4 - 10.5 mg/dL   Total Protein 6.0 6.0 - 8.3 g/dL   Albumin 3.6 3.5 - 5.2 g/dL   AST 24 0 - 37 U/L   ALT 22 0 - 53 U/L   Alkaline Phosphatase 60 39 - 117 U/L   Total Bilirubin 1.2 0.3 - 1.2 mg/dL   GFR calc non Af Amer 76 (L) >90 mL/min   GFR calc Af Amer 88 (L) >90 mL/min    Comment: (NOTE) The eGFR has been calculated using the CKD EPI equation. This calculation has not been validated in all clinical situations. eGFR's persistently <90 mL/min signify possible Chronic  Kidney Disease.    Anion gap 8 5 - 15  Magnesium     Status: None   Collection Time: 09/18/14  6:15 AM  Result Value Ref Range   Magnesium 1.9 1.5 - 2.5 mg/dL  TSH     Status: Abnormal   Collection Time: 09/18/14  6:15 AM  Result Value Ref Range   TSH 0.046 (L) 0.350 - 4.500 uIU/mL  Brain natriuretic peptide     Status: Abnormal   Collection Time: 09/18/14  6:15 AM  Result Value Ref Range   B Natriuretic Peptide 218.1 (H) 0.0 - 100.0 pg/mL  CBC WITH DIFFERENTIAL     Status: Abnormal   Collection Time: 09/18/14  6:15 AM  Result Value Ref Range   WBC 9.9 4.0 - 10.5 K/uL   RBC 4.28 4.22 - 5.81 MIL/uL   Hemoglobin 13.0 13.0 - 17.0 g/dL   HCT 37.6 (L) 39.0 - 52.0 %   MCV 87.9 78.0 - 100.0 fL   MCH 30.4 26.0 - 34.0 pg   MCHC 34.6 30.0 - 36.0 g/dL   RDW 14.9 11.5 - 15.5 %   Platelets 146 (L) 150 - 400 K/uL   Neutrophils Relative % 75 43 - 77 %   Neutro Abs 7.4 1.7 - 7.7 K/uL   Lymphocytes Relative 15 12 - 46 %   Lymphs Abs 1.5 0.7 - 4.0 K/uL   Monocytes Relative 9 3 - 12 %   Monocytes Absolute 0.9 0.1 - 1.0 K/uL   Eosinophils Relative 1 0 - 5 %   Eosinophils Absolute 0.1 0.0 - 0.7 K/uL   Basophils Relative 0 0 - 1 %   Basophils Absolute 0.0 0.0 - 0.1 K/uL  Protime-INR     Status: Abnormal   Collection Time: 09/18/14  6:15 AM  Result Value Ref Range   Prothrombin Time 16.5 (H) 11.6 - 15.2 seconds   INR 1.32 0.00 - 1.49  APTT     Status: Abnormal   Collection Time: 09/18/14  6:15 AM  Result Value Ref Range   aPTT 76 (H) 24 - 37 seconds    Comment:        IF BASELINE aPTT IS ELEVATED, SUGGEST PATIENT RISK ASSESSMENT BE USED TO DETERMINE APPROPRIATE ANTICOAGULANT THERAPY.   Heparin level (unfractionated)     Status: Abnormal   Collection Time: 09/18/14  6:15 AM  Result Value Ref Range   Heparin Unfractionated 0.22 (L) 0.30 - 0.70 IU/mL    Comment:        IF HEPARIN RESULTS ARE BELOW EXPECTED VALUES, AND PATIENT DOSAGE HAS BEEN CONFIRMED, SUGGEST FOLLOW UP TESTING OF  ANTITHROMBIN III LEVELS.   Platelet inhibition p2y12     Status: None   Collection Time: 09/18/14 11:16 AM  Result Value Ref Range   Platelet Function  P2Y12 242 194 - 418 PRU    Comment:        The literature has shown a direct correlation of PRU values over 230 with higher risks of thrombotic events.  Lower PRU values are associated with platelet inhibition.   Heparin level (unfractionated)     Status: None   Collection Time: 09/18/14  2:35 PM  Result Value Ref Range   Heparin Unfractionated 0.40 0.30 - 0.70 IU/mL    Comment:        IF HEPARIN RESULTS ARE BELOW EXPECTED VALUES, AND PATIENT DOSAGE HAS BEEN CONFIRMED, SUGGEST FOLLOW UP TESTING OF ANTITHROMBIN III LEVELS.     No results found.  Review of Systems  Constitutional: Negative for fever, chills and malaise/fatigue.  Respiratory: Negative for cough and shortness of breath.   Cardiovascular: Positive for chest pain and palpitations. Negative for leg swelling.  Genitourinary: Positive for frequency.  Musculoskeletal:       Bilateral total knee, no pain currently  Neurological: Negative.  Negative for weakness.  Endo/Heme/Allergies: Does not bruise/bleed easily.  All other systems reviewed and are negative.  Blood pressure 106/61, pulse 76, temperature 98.2 F (36.8 C), temperature source Oral, resp. rate 14, height _0  (1.803 m), weight 207 lb 7.3 oz (94.1 kg), SpO2 94 %. Physical Exam  Vitals reviewed. Constitutional: He is oriented to person, place, and time. He appears well-developed and well-nourished. No distress.  HENT:  Head: Normocephalic and atraumatic.  Eyes: EOM are normal. Pupils are equal, round, and reactive to light.  Neck: Neck supple. No thyromegaly present.  Cardiovascular: Normal rate, regular rhythm, normal heart sounds and intact distal pulses.  Exam reveals no gallop.   No murmur heard. Respiratory: Effort normal and breath sounds normal.  GI: Soft. There is no tenderness.   Musculoskeletal: He exhibits no edema.  Well healed scars both knees  Lymphadenopathy:    He has no cervical adenopathy.  Neurological: He is alert and oriented to person, place, and time. No cranial nerve deficit.  No focal deficit  Skin: Skin is warm and dry.   Cardiac catheterization films reviewed. They show severe 3 vessel CAD  Assessment/Plan: 81 yo man who presents with an unstable coronary syndrome and has been found to have severe 3 vessel CAD with preserved LV function.  CABG is indicated for survival benefit and relief of symptoms.  I discussed the general nature of the procedure, the need for general anesthesia, and the incisions to be used with Mr Trethewey and his family. I discussed the expected hospital stay, overall recovery and short and long term outcomes. I reviewed the indications, risks, benefits and alternatives. They understand the risks include, but are not limited to death, stroke, MI, DVT/PE, bleeding, possible need for transfusion, infections, cardiac arrhythmias, and other organ system dysfunction including respiratory, renal, or GI complications.   He accepts the risks and agrees to proceed.  Melrose Nakayama 09/18/2014, 5:43 PM     Addendum  P2Y12 = 242- I think we can proceed with CABG on Friday with only a  modestly increased risk of bleeding complications.  [name] [name], MD [facility] [phone]

## 2014-09-18 NOTE — Progress Notes (Signed)
Dr. Philbert Riser to bedside. Pt still complains of chest pain despite the nitro, remains in Afib, BP stable. Orders received (see MAR), will continue to monitor.

## 2014-09-18 NOTE — Progress Notes (Signed)
81 year old male with history of CAD s/p PCI/DES to mLAD and dRCA in 2005 in the setting of [place], HTN, HLD, PVD, GERD, intermittent dizziness, arthritis s/p right knee replacement who presented to [facility] on 4/18 with 2 day history of worsening intermittent chest pain. Details of cardiac in 2005 showed 90% mid LAD, 80% distal RCA, 50% OM1 at the mid vessel, Taxus stent 2.75 x 24 mm stent to his LAD, Taxus stent 2.5 x 16 mm stent to the RCA. He had follow up nuclear stress in October 2009 showing no ischemia. Again, with repeat nuclear stress testing in the setting of knee replacement surgery in July 2011 that showed mild ischemia in the inferior wall consistent with previous stress test. History of prior dizziness that resolved without intervention. He has history of no significant aortic disease, mild carotid plaquing with mixed irregular plaque estimated at 40% on the right. He continues to care for his wife at home who has Alzheimer's disease. There is significant stress at home. Cath with 3 vessel disease.  Needs CABG.  Last pm a fib with RVR but on dilt drip HR to 40 so dilt stopped.  Last dose of plavix yesterday.  P2Y12 242.  Subjective: No pain until walked in hall now with chest pain.  On Heparin, have made bed rest.   Pt had chest pain during the night and a fib with RVR.  On dilt drip he slowed to 40 SR and drip stopped.     Objective: Vital signs in last 24 hours: Temp:  [97.6 F (36.4 C)-98.3 F (36.8 C)] 98.3 F (36.8 C) (04/20 1200) Pulse Rate:  [40-128] 68 (04/20 1500) Resp:  [13-25] 20 (04/20 1500) BP: (77-122)/(45-90) 105/56 mmHg (04/20 1500) SpO2:  [92 %-100 %] 97 % (04/20 1500) Weight:  [204 lb 9.4 oz (92.8 kg)-207 lb 7.3 oz (94.1 kg)] 207 lb 7.3 oz (94.1 kg) (04/20 0500) Weight change:  Last BM Date: 09/17/14 Intake/Output from previous day: +235 04/19 0701 - 04/20 0700 In: 1177.8 [P.O.:360; I.V.:817.8] Out: 550 [Urine:550] Intake/Output this shift: Total  I/O In: 329 [P.O.:240; I.V.:89] Out: 150 [Urine:150]  PE: General:Pleasant affect, NAD Skin:Warm and dry, brisk capillary refill HEENT:normocephalic, sclera clear, mucus membranes moist Heart:S1S2 RRR without murmur, gallup, rub or click Lungs:clear without rales, rhonchi, or wheezes VZC:HYIF, non tender, + BS, do not palpate liver spleen or masses Ext:no lower ext edema, 2+ pedal pulses, 2+ radial pulses Neuro:alert and oriented, MAE, follows commands, + facial symmetry Tele:  SR today     Lab Results:  Recent Labs  09/18/14 0615  WBC 9.9  HGB 13.0  HCT 37.6*  PLT 146*   BMET  Recent Labs  09/18/14 0615  NA 136  K 4.1  CL 104  CO2 24  GLUCOSE 179*  BUN 15  CREATININE 0.96  CALCIUM 8.6   No results for input(s): TROPONINI in the last 72 hours.  Invalid input(s): CK, MB  Lab Results  Component Value Date   CHOL 149 06/02/2010   HDL 34* 06/02/2010   LDLCALC 72 06/02/2010   TRIG 217* 06/02/2010   CHOLHDL 4.4 Ratio 06/02/2010   No results found for: HGBA1C   Lab Results  Component Value Date   TSH 0.046* 09/18/2014    Hepatic Function Panel  Recent Labs  09/18/14 0615  PROT 6.0  ALBUMIN 3.6  AST 24  ALT 22  ALKPHOS 60  BILITOT 1.2   No results for input(s): CHOL  in the last 72 hours. No results for input(s): PROTIME in the last 72 hours.     Studies/Results: No results found.  Medications: I have reviewed the patient's current medications. Scheduled Meds: . aspirin EC  81 mg Oral Daily  . atorvastatin  20 mg Oral q1800  . benazepril  20 mg Oral Daily  . docusate sodium  100 mg Oral BID  . famotidine  20 mg Oral BID  . levobunolol  1 drop Both Eyes Daily  . metoprolol tartrate  12.5 mg Oral BID  . nitroGLYCERIN  1 inch Topical 4 times per day  . omega-3 acid ethyl esters  1 g Oral Daily   Continuous Infusions: . diltiazem (CARDIZEM) infusion Stopped (09/18/14 0500)  . heparin 1,500 Units/hr (09/18/14 1500)   PRN  Meds:.acetaminophen, morphine injection, nitroGLYCERIN, ondansetron (ZOFRAN) IV, oxyCODONE  Assessment/Plan: 1. Unstable angina:  -Stuttering chest pain, worsening in nature and now returning pain on IV NTG BP dropped will add NTG paste and prn morphine.   -Cardiac cath yesterday showed critical proximal LCx and ostial/proximal LAD disease, estimated at 90 to 95%. RCA with mild diffuse disease. Normal EF estimated at >55%, no WMA.  -Echo showed normal LV function -Continue nitro paste- with pain had stopped and placed on IV then due to hypotension it was stopped again. -Restart heparin gtt at 10 PM (22:00) tonight without bolus   -2. CAD s/p PCI/DES in 2005: -Continue Lopressor 12.5 mg bid, aspirin 81 mg, Lipitor 20 mg   3. HTN: -Continue Lotrel 5/20 mg and Lopressor as above  4. HLD: -LDL at goal as above -Continue Liptior  5. Hyperglycemia: -Check A1C  6. GERD: -Continue Zantac 150 mg bid  7. Recent s/p lap chole ercp  LOS: 1 day   Time spent with pt. :15 minutes. [facility] R  Nurse Practitioner Certified Pager [phone] or after 5pm and on weekends call [phone] 09/18/2014, 3:44 PM   Patinet seen and examined.  I agree with findings as noted above by L INgold   Patinet has intermitt CP  Awaiting CABG Bedrest.  Titrate meds as needed to keep pain free  Dorris Carnes

## 2014-09-18 NOTE — Progress Notes (Addendum)
Pt with recurrent chest pain 5/10, HR 90s BP 118/73 .  2L [place] placed on patient, SL nitro x1, 1mg  morphine given and EKG done.  Pt now reports 1/10 chest pain.  Jules Husbands notiifed.  Will continue to monitor.  Vista Lawman, RN

## 2014-09-18 NOTE — Treatment Plan (Signed)
Called by RN to notify that pt was complaining of 10/10 chest pain and rhythm had changed on telemetry, ECG was being obtained and NTG given.  Upon arrival, ECG in progress but tele with AF RVR sustaining approximately 130 bpm. Lopressor 5 mg IV x 2 given with sustained HR ~ 100 bpm and pain down to 1/10.  ECG reveals AF RVR and anterolateral ST depressions consistent with ischemia.  As EF is preserved, will give IV diltiazem bolus and initiate drip to ensure good rate control given known LAD/Cx disease.  Could also consider amiodarone since this is new onset (though question whether this may have been precipitating CP as an outpatient), is on heparin drip and will likely be going to CABG which may precipitate more AF.  However, given current RVR with ischemic symptoms/ECG changes will focus on aggressive rate control tonight as symptoms have resolved with above intervention.  Christopher [name] 09/18/2014 12:16 AM

## 2014-09-18 NOTE — Progress Notes (Signed)
Dr. Philbert Riser notified of HR in the 40's, decreased SBP in 80's, will stop the cardizem gtt. Pt is alert and oriented, complains of 2/10 chest pain, will continue to monitor.

## 2014-09-18 NOTE — Progress Notes (Signed)
      [address]       [place] [address]             [phone]      Full consult to follow  81 yo man with history of CAD presented with unstable CP.  At cath has severe 3 vessel CAD and needs CABG  Has been on plavix, now dc'ed but did get it yesterday  Will check P2 Y12 assay  Remo Lipps C. [name], MD [facility] [phone]

## 2014-09-18 NOTE — Significant Event (Signed)
Patient requested to walk in the halls. Walked approximately 90 feet until felt chest pain, describing it sharp @ center, rating it a 2 on a 1-10 pain scale. Patient returned to bed. VS taken and are within his normal limits. Cardiology NP Dorene Ar is in unit and is aware. New orders received. Jaimen Melone, Therapist, sports.

## 2014-09-18 NOTE — Progress Notes (Signed)
Pt complains of 10/10 chest pain, prn morphine and sublingual nitro given. Pt appears to be in Afib with rate in the 120's, EKG in progress.

## 2014-09-19 ENCOUNTER — Inpatient Hospital Stay (HOSPITAL_COMMUNITY): Payer: Medicare Other

## 2014-09-19 DIAGNOSIS — I4891 Unspecified atrial fibrillation: Secondary | ICD-10-CM

## 2014-09-19 DIAGNOSIS — I6523 Occlusion and stenosis of bilateral carotid arteries: Secondary | ICD-10-CM

## 2014-09-19 DIAGNOSIS — I214 Non-ST elevation (NSTEMI) myocardial infarction: Secondary | ICD-10-CM

## 2014-09-19 DIAGNOSIS — I200 Unstable angina: Secondary | ICD-10-CM

## 2014-09-19 LAB — TROPONIN I
Troponin I: 2.81 ng/mL (ref <0.031)
Troponin I: 3.59 ng/mL (ref <0.031)

## 2014-09-19 LAB — ABO/RH: ABO/RH(D): B NEG

## 2014-09-19 LAB — BLOOD GAS, ARTERIAL
Acid-base deficit: 0.9 mmol/L (ref 0.0–2.0)
BICARBONATE: 22.6 meq/L (ref 20.0–24.0)
Drawn by: 12971
O2 Content: 2 L/min
O2 Saturation: 96 %
PATIENT TEMPERATURE: 98.6
PH ART: 7.447 (ref 7.350–7.450)
PO2 ART: 80.4 mmHg (ref 80.0–100.0)
TCO2: 23.6 mmol/L (ref 0–100)
pCO2 arterial: 33.3 mmHg — ABNORMAL LOW (ref 35.0–45.0)

## 2014-09-19 LAB — TYPE AND SCREEN
ABO/RH(D): B NEG
ANTIBODY SCREEN: NEGATIVE

## 2014-09-19 LAB — URINE MICROSCOPIC-ADD ON

## 2014-09-19 LAB — URINALYSIS, ROUTINE W REFLEX MICROSCOPIC
Glucose, UA: 250 mg/dL — AB
Ketones, ur: 40 mg/dL — AB
Leukocytes, UA: NEGATIVE
Nitrite: NEGATIVE
Protein, ur: NEGATIVE mg/dL
SPECIFIC GRAVITY, URINE: 1.024 (ref 1.005–1.030)
Urobilinogen, UA: 1 mg/dL (ref 0.0–1.0)
pH: 5 (ref 5.0–8.0)

## 2014-09-19 LAB — CBC
HCT: 38.2 % — ABNORMAL LOW (ref 39.0–52.0)
Hemoglobin: 13.1 g/dL (ref 13.0–17.0)
MCH: 30 pg (ref 26.0–34.0)
MCHC: 34.3 g/dL (ref 30.0–36.0)
MCV: 87.6 fL (ref 78.0–100.0)
Platelets: 135 10*3/uL — ABNORMAL LOW (ref 150–400)
RBC: 4.36 MIL/uL (ref 4.22–5.81)
RDW: 14.8 % (ref 11.5–15.5)
WBC: 11.5 10*3/uL — AB (ref 4.0–10.5)

## 2014-09-19 LAB — SURGICAL PCR SCREEN
MRSA, PCR: NEGATIVE
STAPHYLOCOCCUS AUREUS: NEGATIVE

## 2014-09-19 LAB — HEMOGLOBIN A1C
Hgb A1c MFr Bld: 6.6 % — ABNORMAL HIGH (ref 4.8–5.6)
Mean Plasma Glucose: 143 mg/dL

## 2014-09-19 LAB — HEPARIN LEVEL (UNFRACTIONATED): HEPARIN UNFRACTIONATED: 0.33 [IU]/mL (ref 0.30–0.70)

## 2014-09-19 MED ORDER — DOPAMINE-DEXTROSE 3.2-5 MG/ML-% IV SOLN
0.0000 ug/kg/min | INTRAVENOUS | Status: DC
  Filled 2014-09-19 (×2): qty 250

## 2014-09-19 MED ORDER — DEXMEDETOMIDINE HCL IN NACL 400 MCG/100ML IV SOLN
0.1000 ug/kg/h | INTRAVENOUS | Status: AC
  Administered 2014-09-20: 0.2 ug/kg/h via INTRAVENOUS
  Filled 2014-09-19: qty 100

## 2014-09-19 MED ORDER — DEXTROSE 5 % IV SOLN
1.5000 g | INTRAVENOUS | Status: AC
  Administered 2014-09-20: .75 g via INTRAVENOUS
  Administered 2014-09-20: 1.5 g via INTRAVENOUS
  Filled 2014-09-19: qty 1.5

## 2014-09-19 MED ORDER — POTASSIUM CHLORIDE 2 MEQ/ML IV SOLN
80.0000 meq | INTRAVENOUS | Status: DC
  Filled 2014-09-19: qty 40

## 2014-09-19 MED ORDER — MAGNESIUM SULFATE 50 % IJ SOLN
40.0000 meq | INTRAMUSCULAR | Status: DC
  Filled 2014-09-19: qty 10

## 2014-09-19 MED ORDER — NITROGLYCERIN IN D5W 200-5 MCG/ML-% IV SOLN
INTRAVENOUS | Status: AC
  Filled 2014-09-19: qty 250

## 2014-09-19 MED ORDER — AMIODARONE HCL IN DEXTROSE 360-4.14 MG/200ML-% IV SOLN
60.0000 mg/h | INTRAVENOUS | Status: AC
  Administered 2014-09-19 – 2014-09-20 (×2): 60 mg/h via INTRAVENOUS
  Filled 2014-09-19 (×2): qty 200

## 2014-09-19 MED ORDER — DIAZEPAM 2 MG PO TABS: 2.0000 mg | ORAL_TABLET | Freq: Once | ORAL | Status: DC

## 2014-09-19 MED ORDER — SODIUM CHLORIDE 0.9 % IV SOLN
INTRAVENOUS | Status: AC
  Administered 2014-09-20: 69.8 mL/h via INTRAVENOUS
  Filled 2014-09-19: qty 40

## 2014-09-19 MED ORDER — EPINEPHRINE HCL 1 MG/ML IJ SOLN
0.0000 ug/min | INTRAVENOUS | Status: DC
  Filled 2014-09-19: qty 4

## 2014-09-19 MED ORDER — SODIUM CHLORIDE 0.9 % IV SOLN
INTRAVENOUS | Status: AC
  Administered 2014-09-20: 3.2 [IU]/h via INTRAVENOUS
  Filled 2014-09-19: qty 2.5

## 2014-09-19 MED ORDER — CHLORHEXIDINE GLUCONATE CLOTH 2 % EX PADS
6.0000 | MEDICATED_PAD | Freq: Once | CUTANEOUS | Status: AC
  Administered 2014-09-20: 6 via TOPICAL

## 2014-09-19 MED ORDER — SODIUM CHLORIDE 0.9 % IV BOLUS (SEPSIS)
500.0000 mL | Freq: Once | INTRAVENOUS | Status: AC
  Administered 2014-09-19: 500 mL via INTRAVENOUS

## 2014-09-19 MED ORDER — TEMAZEPAM 15 MG PO CAPS: 15.0000 mg | ORAL_CAPSULE | Freq: Once | ORAL | Status: AC | PRN

## 2014-09-19 MED ORDER — NITROGLYCERIN IN D5W 200-5 MCG/ML-% IV SOLN
0.0000 ug/min | INTRAVENOUS | Status: DC
  Administered 2014-09-19: 10 ug/min via INTRAVENOUS

## 2014-09-19 MED ORDER — AMIODARONE HCL IN DEXTROSE 360-4.14 MG/200ML-% IV SOLN
30.0000 mg/h | INTRAVENOUS | Status: DC
  Administered 2014-09-20: 30 mg/h via INTRAVENOUS
  Filled 2014-09-19 (×2): qty 200

## 2014-09-19 MED ORDER — METOPROLOL TARTRATE 12.5 MG HALF TABLET
12.5000 mg | ORAL_TABLET | Freq: Once | ORAL | Status: DC
  Filled 2014-09-19: qty 1

## 2014-09-19 MED ORDER — CHLORHEXIDINE GLUCONATE CLOTH 2 % EX PADS
6.0000 | MEDICATED_PAD | Freq: Once | CUTANEOUS | Status: AC
  Administered 2014-09-19: 6 via TOPICAL

## 2014-09-19 MED ORDER — CEFUROXIME SODIUM 750 MG IJ SOLR
750.0000 mg | INTRAMUSCULAR | Status: DC
  Filled 2014-09-19: qty 750

## 2014-09-19 MED ORDER — BISACODYL 5 MG PO TBEC: 5.0000 mg | DELAYED_RELEASE_TABLET | Freq: Once | ORAL | Status: DC

## 2014-09-19 MED ORDER — VANCOMYCIN HCL 10 G IV SOLR
1250.0000 mg | INTRAVENOUS | Status: AC
  Administered 2014-09-20: 1250 mg via INTRAVENOUS
  Filled 2014-09-19: qty 1250

## 2014-09-19 MED ORDER — ALPRAZOLAM 0.25 MG PO TABS
0.2500 mg | ORAL_TABLET | ORAL | Status: DC | PRN
  Administered 2014-09-19: 0.25 mg via ORAL
  Filled 2014-09-19: qty 1

## 2014-09-19 MED ORDER — SODIUM CHLORIDE 0.9 % IV SOLN
INTRAVENOUS | Status: DC
  Filled 2014-09-19: qty 30

## 2014-09-19 MED ORDER — NITROGLYCERIN IN D5W 200-5 MCG/ML-% IV SOLN
2.0000 ug/min | INTRAVENOUS | Status: DC
  Filled 2014-09-19: qty 250

## 2014-09-19 MED ORDER — PAPAVERINE HCL 30 MG/ML IJ SOLN
INTRAMUSCULAR | Status: AC
  Administered 2014-09-20: 500 mL
  Filled 2014-09-19: qty 2.5

## 2014-09-19 MED ORDER — PHENYLEPHRINE HCL 10 MG/ML IJ SOLN
30.0000 ug/min | INTRAVENOUS | Status: DC
  Filled 2014-09-19: qty 2

## 2014-09-19 NOTE — Progress Notes (Signed)
ANTICOAGULATION CONSULT NOTE - Follow Up Consult  Pharmacy Consult for Heparin Indication: CAD  No Known Allergies  Patient Measurements: Height: 5\' 11"  (180.3 cm) Weight: 207 lb 3.7 oz (94 kg) IBW/kg (Calculated) : 75.3 Heparin Dosing Weight: 93 kg  Vital Signs: Temp: 97.9 F (36.6 C) (04/21 0700) Temp Source: Oral (04/21 0700) BP: 109/63 mmHg (04/21 0900) Pulse Rate: 104 (04/21 0900)  Labs:  Recent Labs  09/18/14 0615 09/18/14 1435 09/18/14 1650 09/18/14 2045 09/18/14 2307 09/19/14 0446  HGB 13.0  --   --   --   --  13.1  HCT 37.6*  --   --   --   --  38.2*  PLT 146*  --   --   --   --  135*  APTT 76*  --   --   --   --   --   LABPROT 16.5*  --   --   --   --   --   INR 1.32  --   --   --   --   --   HEPARINUNFRC 0.22* 0.40  --  0.37  --  0.33  CREATININE 0.96  --   --   --   --   --   TROPONINI  --   --  2.63*  --  2.81* 3.59*    Estimated Creatinine Clearance: 71.9 mL/min (by C-G formula based on Cr of 0.96).    Assessment: 81 year old man admitted 09/17/2014 on transfer to [facility] from [facility]. 4/19 cath, Critical CAD found. To be evaluated for CABG.s/p Plavix (last 4/19). PRU 242.  Anticoagulation/Heme: ACS,  pending OHS plan, P2y12 level pending.s/p Plavix (last dose 4/19). PRU 242. HL at goal, CBC stable, no bleeding noted Hep @ 1500/hr  Cardiovascular: CAD, increasing Tp, ongoing CP, CABG plan for 4/22 ON: asa, atorva, benaz, plavix (dcd per [facility] note), metop, lovaza  Goal of Therapy:  Heparin level 0.3-0.7 units/ml Monitor platelets by anticoagulation protocol: Yes   Plan:  Continue heparin at 1500 units/hr Daily HL and CBC.   Thank you for allowing pharmacy to be a part of this patients care team.  Rowe Robert Pharm.D., BCPS, AQ-Cardiology Clinical Pharmacist 09/19/2014 10:08 AM Pager: [phone] Phone: [phone]

## 2014-09-19 NOTE — Progress Notes (Signed)
Pt currently having bursts of A Fib increasing the HR into the 160s-180s. Pt lying in bed during bursts with complaints of CP and diaphoresis. SBPs ranging in 90s/50s. MD made aware. No new orders received.  Will continue to monitor pt closely.

## 2014-09-19 NOTE — Progress Notes (Signed)
Notified Dr. Philbert Riser of BP 70s/40s.  Nitro gtt has been turned off.  Pt sleeping, awakens easily, oriented x4.  HR currently sinus rhythm 60s-70s, not having further bursts of afib.  Orders received for NS bolus, will continue to monitor pt for chest pain off nitro gtt.  Vista Lawman, RN

## 2014-09-19 NOTE — Progress Notes (Signed)
Amiodarone Drug - Drug Interaction Consult Note  Recommendations: This patient is on atorvastatin and metoprolol, which may interact with amiodarone. Monitor closely for s/sx of myopathy and myocardial depression.  Amiodarone is metabolized by the cytochrome P450 system and therefore has the potential to cause many drug interactions. Amiodarone has an average plasma half-life of 50 days (range 20 to 100 days).   There is potential for drug interactions to occur several weeks or months after stopping treatment and the onset of drug interactions may be slow after initiating amiodarone.   [x]  Statins: Increased risk of myopathy. Simvastatin- restrict dose to 20mg  daily. Other statins: counsel patients to report any muscle pain or weakness immediately.  []  Anticoagulants: Amiodarone can increase anticoagulant effect. Consider warfarin dose reduction. Patients should be monitored closely and the dose of anticoagulant altered accordingly, remembering that amiodarone levels take several weeks to stabilize.  []  Antiepileptics: Amiodarone can increase plasma concentration of phenytoin, the dose should be reduced. Note that small changes in phenytoin dose can result in large changes in levels. Monitor patient and counsel on signs of toxicity.  [x]  Beta blockers: increased risk of bradycardia, AV block and myocardial depression. Sotalol - avoid concomitant use.  []   Calcium channel blockers (diltiazem and verapamil): increased risk of bradycardia, AV block and myocardial depression.  []   Cyclosporine: Amiodarone increases levels of cyclosporine. Reduced dose of cyclosporine is recommended.  []  Digoxin dose should be halved when amiodarone is started.  []  Diuretics: increased risk of cardiotoxicity if hypokalemia occurs.  []  Oral hypoglycemic agents (glyburide, glipizide, glimepiride): increased risk of hypoglycemia. Patient's glucose levels should be monitored closely when initiating amiodarone  therapy.   []  Drugs that prolong the QT interval:  Torsades de pointes risk may be increased with concurrent use - avoid if possible.  Monitor QTc, also keep magnesium/potassium WNL if concurrent therapy can't be avoided. Marland Kitchen Antibiotics: e.g. fluoroquinolones, erythromycin. . Antiarrhythmics: e.g. quinidine, procainamide, disopyramide, sotalol. . Antipsychotics: e.g. phenothiazines, haloperidol.  . Lithium, tricyclic antidepressants, and methadone. Thank You,  Andrey Cota. Diona Foley, PharmD Clinical Pharmacist Pager [phone] 09/19/2014 8:52 PM

## 2014-09-19 NOTE — Progress Notes (Signed)
      [address]       [place] [address]             [phone]      Sleeping presently  BP 103/66 mmHg  Pulse 93  Temp(Src) 98.1 F (36.7 C) (Oral)  Resp 23  Ht 5\' 11"  (1.803 m)  Wt 207 lb 3.7 oz (94 kg)  BMI 28.92 kg/m2  SpO2 96%   Intake/Output Summary (Last 24 hours) at 09/19/14 1638 Last data filed at 09/19/14 1500  Gross per 24 hour  Intake  601.4 ml  Output    900 ml  Net -298.6 ml    He has been pain free since nitroglycerin increased to 20.  For CABG 1st case in AM  [place] C. [name], MD [facility] [phone]

## 2014-09-19 NOTE — Progress Notes (Signed)
Patient Name: Christopher [name] Date of Encounter: 09/19/2014  Principal Problem:   NSTEMI (non-ST elevated myocardial infarction) Active Problems:   Mixed hyperlipidemia   Borderline diabetes   Unstable angina   Atrial fibrillation with RVR    Primary Cardiologist: Dr. Rockey Situ  Patient Profile: 81 yo male w/ hx of CAD s/p PCI/DES to mLAD and dRCA (2005), HTN, HLD, PVD, GERD, intermittent dizziness, who presented to [facility] on 4/18 with a 2 day hx of worsening intermittent chest pain. Negative troponin x 3, ECG no acute ischemic changes. Echo 04/18 EF 25-00%, normal systolic function, mild MR. Cardiac cath 04/19 critical proximal LCX and ostial/proximal LAD disease, 90 to 95%, RCA with mild diffuse disease. Transferred to [facility] 04/19 for possible CABG. Developed chest pain and rhythm change on telemetry 04/20. ECG demontrated AF with RVR and anterolateral ST depression. Troponin elevated to 2.63, 2.81, 3.59.   SUBJECTIVE: Required 5 doses of SL NTG this AM for chest pain relief. Started on nitro drip. Scheduled for CABG tomorrow morning (1st case). Currently, denies chest pain, palpitations, shortness of breath, nausea, diaphoresis.   OBJECTIVE Filed Vitals:   09/19/14 0800 09/19/14 0900 09/19/14 1000 09/19/14 1100  BP: 117/70 109/63 124/74 125/75  Pulse: 95 104 95 101  Temp:      TempSrc:      Resp: 26 26 27 22   Height:      Weight:      SpO2: 98% 98% 99% 99%    Intake/Output Summary (Last 24 hours) at 09/19/14 1250 Last data filed at 09/19/14 0900  Gross per 24 hour  Intake    675 ml  Output    975 ml  Net   -300 ml   Filed Weights   09/17/14 1854 09/18/14 0500 09/19/14 0500  Weight: 204 lb 9.4 oz (92.8 kg) 207 lb 7.3 oz (94.1 kg) 207 lb 3.7 oz (94 kg)    PHYSICAL EXAM General: Well developed male in no acute distress. Head: Normocephalic, atraumatic.  Neck: Supple without bruits, JVD not elevated. Lungs: Resp regular and unlabored, on 3L [place], CTA. Heart: RRR, S1, S2,  no S3, S4, or murmur; no rub. Abdomen: Soft, non-tender, non-distended, BS + x 4.  Extremities: No clubbing, cyanosis, no edema.  Neuro: Alert and oriented X 3. Moves all extremities spontaneously. Psych: Normal affect.  LABS: CBC:  Recent Labs  09/18/14 0615 09/19/14 0446  WBC 9.9 11.5*  NEUTROABS 7.4  --   HGB 13.0 13.1  HCT 37.6* 38.2*  MCV 87.9 87.6  PLT 146* 135*   INR:  Recent Labs  09/18/14 0615  INR 3.70   Basic Metabolic Panel:  Recent Labs  09/18/14 0615  NA 136  K 4.1  CL 104  CO2 24  GLUCOSE 179*  BUN 15  CREATININE 0.96  CALCIUM 8.6  MG 1.9   Liver Function Tests:  Recent Labs  09/18/14 0615  AST 24  ALT 22  ALKPHOS 60  BILITOT 1.2  PROT 6.0  ALBUMIN 3.6   Cardiac Enzymes:  Recent Labs  09/18/14 1650 09/18/14 2307 09/19/14 0446  TROPONINI 2.63* 2.81* 3.59*   BNP:  B NATRIURETIC PEPTIDE  Date/Time Value Ref Range Status  09/18/2014 06:15 AM 218.1* 0.0 - 100.0 pg/mL Final    Recent Labs  09/18/14 0615  HGBA1C 6.6*   Thyroid Function Tests:  Recent Labs  09/18/14 0615  TSH 0.046*    TELE:  SR, occ junctional beats and PACs now. Earlier afib/RVR>>3.5 sec  pause>>junctional brady>>SR  ECG: SR, 88, anterior ST depression   Radiology/Studies: CXR this AM pending  No results found.   Current Medications:  . [START ON 09/20/2014] aminocaproic acid (AMICAR) for OHS   Intravenous To OR  . aspirin EC  81 mg Oral Daily  . atorvastatin  20 mg Oral q1800  . benazepril  20 mg Oral Daily  . bisacodyl  5 mg Oral Once  . [START ON 09/20/2014] cefUROXime (ZINACEF)  IV  1.5 g Intravenous To OR  . [START ON 09/20/2014] cefUROXime (ZINACEF)  IV  750 mg Intravenous To OR  . Chlorhexidine Gluconate Cloth  6 each Topical Once   And  . [START ON 09/20/2014] Chlorhexidine Gluconate Cloth  6 each Topical Once  . [START ON 09/20/2014] dexmedetomidine  0.1-0.7 mcg/kg/hr Intravenous To OR  . [START ON 09/20/2014] diazepam  2 mg Oral Once  .  docusate sodium  100 mg Oral BID  . [START ON 09/20/2014] DOPamine  0-10 mcg/kg/min Intravenous To OR  . [START ON 09/20/2014] epinephrine  0-10 mcg/min Intravenous To OR  . famotidine  20 mg Oral BID  . [START ON 09/20/2014] heparin-papaverine-plasmalyte irrigation   Irrigation To OR  . [START ON 09/20/2014] heparin 30,000 units/NS 1000 mL solution for CELLSAVER   Other To OR  . [START ON 09/20/2014] insulin (NOVOLIN-R) infusion   Intravenous To OR  . levobunolol  1 drop Both Eyes Daily  . [START ON 09/20/2014] magnesium sulfate  40 mEq Other To OR  . metoprolol tartrate  12.5 mg Oral BID  . [START ON 09/20/2014] metoprolol tartrate  12.5 mg Oral Once  . nitroGLYCERIN  0.5 inch Topical 4 times per day  . [START ON 09/20/2014] nitroGLYCERIN  2-200 mcg/min Intravenous To OR  . omega-3 acid ethyl esters  1 g Oral Daily  . [START ON 09/20/2014] phenylephrine (NEO-SYNEPHRINE) Adult infusion  30-200 mcg/min Intravenous To OR  . [START ON 09/20/2014] potassium chloride  80 mEq Other To OR  . [START ON 09/20/2014] vancomycin  1,250 mg Intravenous To OR   . heparin 1,500 Units/hr (09/19/14 0900)  . nitroGLYCERIN 10 mcg/min (09/19/14 1117)    ASSESSMENT AND PLAN:  Active Problems:   Unstable angina>>NSTEMI - Chest pain this AM, relieved with SL NTG, NTG drip - Echo 04/18 EF 32-54%, normal systolic function, mild MR - Cardiac cath 04/19 critical proximal LCX and ostial/proximal LAD disease, 90 to 95%, RCA with mild diffuse disease - Troponin elevated (2.63, 2.81, 3.59)  - Continue lopressor 12.5 mg BID, ASA 81 mg daily, lipitor 20 mg daily - Continue heparin drip, nitroglycerin drip - Plan for CABG tomorrow (1st case in AM)    CAD - See above  Atrial fibrillation with RVR - Noted on ECG 04/20, initially on diltiazem drip, afib>>junctional brady w/ 3.5 sec pause>>SR - Cardizem discontinued given junctional bradycardia to 40s and SBP in 80s - Currently SR - Continue rate control with lopressor 12.5  mg BID  - TSH low but free T4 OK    HTN - SBP 98-125 over past 24 hours, monitor - Continue lopressor 12.5 mg BID     HLD - LDL goal <70 - Continue lipitor, increase to 40 mg daily    Anticoagulation - currently on ASA/heparin - This patients CHA2DS2-VASc Score and unadjusted Ischemic Stroke Rate (% per year) is equal to 4.8 % stroke rate/year from a score of 4 Above score calculated as 1 point each if present [CHF, HTN, DM, Vascular=MI/PAD/Aortic Plaque, Age  if 65-74, or Male] Above score calculated as 2 points each if present [Age > 75, or Stroke/TIA/TE]    Plan - CABG in am, 1st case  Signed, Rosaria Ferries , PA-C 12:50 PM 09/19/2014 Patient seen and examined.  Agree with findings as noted by R Barrett above  Patient with intermitt angina  Switched to IV NTG  Plan for CABG tomorrow.    Dorris Carnes

## 2014-09-19 NOTE — Progress Notes (Signed)
MD paged about recurrent use of NTG SL for patient's reoccurring CP. Orders received to start NTG gtt. Will start drip and continue to monitor pt closely.

## 2014-09-19 NOTE — Progress Notes (Signed)
CARDIAC REHAB PHASE I   Pt has been having CP with any movement today. Asleep now so did not wake him. Discussed preop ed with daughter who was appreciative. She is making plans for 24 hr in home care for mother (alzheimers) and also father at d/c. Gave OHS book, guideline and video to watch. Did not give IS as pt would probably have CP but discussed using it post-op. [place], [place], ACSM 09/19/2014 2:05 PM

## 2014-09-19 NOTE — Significant Event (Signed)
Heart Care note:  Called by RN to inform of BP dropping to systolic 32P.  She states she gave the metoprolol 12.5 ordered for tonight as well as a xanax for anxiety, however, recently SBP dropped from 90s to 70s.  He is currently asymptomatic and awakens easily and is alert.  She has since discontinued the NTG drip he was on for recurrent CP.    Plan: Continue to hold NTG drip NS 500 cc bolus now Discontinue benazepril as this is lowering BP and may hinder other medications such as anti-anginal and/or rate controlling medications in light of Low BPs going back to the morning of the 20th.  Kiyra Slaubaugh 09/19/2014 11:27 PM

## 2014-09-19 NOTE — Progress Notes (Signed)
Notified Rhonda Barrett, PA of increasing troponin this am 3.59.  Pt currently chest pain free since approximately 10pm last night.    Vista Lawman, RN

## 2014-09-19 NOTE — Progress Notes (Addendum)
VASCULAR LAB PRELIMINARY  PRELIMINARY  PRELIMINARY  PRELIMINARY  Pre-op Cardiac Surgery  Carotid Findings:  Bilateral:  1-39% ICA stenosis.  Vertebral artery flow is antegrade.      Upper Extremity Right Left  Brachial Pressures 130  triphasic 122  triphasic  Radial Waveforms triphasic triphasic  Ulnar Waveforms triphasic triphasic   Palmar Arch normal normal   Findings:   Doppler waveforms remain normal with both radial and ulnar compressions bilaterally.   Lower  Extremity Right Left  Dorsalis Pedis    Anterior Tibial    Posterior Tibial    Ankle/Brachial Indices      Findings:  Palpable pedal pulses bilaterally.  Edson Snowball RVT 09/19/2014, 5:30 PM  BIGGS, SANDRA, RVT 09/19/2014, 12:23 PM

## 2014-09-19 NOTE — Progress Notes (Signed)
Procedure(s) (LRB): CORONARY ARTERY BYPASS GRAFTING (CABG) (N/A) TRANSESOPHAGEAL ECHOCARDIOGRAM (TEE) (N/A) Subjective: Has had several episodes of chest pain this AM No pain now that he is on a NTG drip  Objective: Vital signs in last 24 hours: Temp:  [97.5 F (36.4 C)-98.9 F (37.2 C)] 97.9 F (36.6 C) (04/21 0700) Pulse Rate:  [68-104] 101 (04/21 1100) Cardiac Rhythm:  [-] Normal sinus rhythm (04/21 1100) Resp:  [12-30] 22 (04/21 1100) BP: (98-125)/(53-75) 125/75 mmHg (04/21 1100) SpO2:  [92 %-100 %] 99 % (04/21 1100) Weight:  [207 lb 3.7 oz (94 kg)] 207 lb 3.7 oz (94 kg) (04/21 0500)  Hemodynamic parameters for last 24 hours:    Intake/Output from previous day: 04/20 0701 - 04/21 0700 In: 959 [P.O.:600; I.V.:359] Out: 1125 [Urine:1125] Intake/Output this shift: Total I/O In: 30 [I.V.:30] Out: -   General appearance: alert, cooperative and no distress Neurologic: intact  Lab Results:  Recent Labs  09/18/14 0615 09/19/14 0446  WBC 9.9 11.5*  HGB 13.0 13.1  HCT 37.6* 38.2*  PLT 146* 135*   BMET:  Recent Labs  09/18/14 0615  NA 136  K 4.1  CL 104  CO2 24  GLUCOSE 179*  BUN 15  CREATININE 0.96  CALCIUM 8.6    PT/INR:  Recent Labs  09/18/14 0615  LABPROT 16.5*  INR 1.32   ABG No results found for: PHART, HCO3, TCO2, ACIDBASEDEF, O2SAT CBG (last 3)  No results for input(s): GLUCAP in the last 72 hours.  Assessment/Plan: S/P Procedure(s) (LRB): CORONARY ARTERY BYPASS GRAFTING (CABG) (N/A) TRANSESOPHAGEAL ECHOCARDIOGRAM (TEE) (N/A) For CABG tomorrow  He has had additional episodes of unstable CP this AM He currently is asymptomatic on a nitroglycerin drip He is aware of indications, risks and benefits of surgery and does not have any questions at this time Will move up to 1st case in AM   LOS: 2 days    Melrose Nakayama 09/19/2014

## 2014-09-19 NOTE — Progress Notes (Signed)
ANTICOAGULATION CONSULT NOTE - Follow Up Consult  Pharmacy Consult for Heparin Indication: CAD  No Known Allergies  Patient Measurements: Height: 5\' 11"  (180.3 cm) Weight: 207 lb 7.3 oz (94.1 kg) IBW/kg (Calculated) : 75.3 Heparin Dosing Weight: 93 kg  Vital Signs: Temp: 98.2 F (36.8 C) (04/20 2351) Temp Source: Oral (04/20 2351) BP: 107/60 mmHg (04/20 2300) Pulse Rate: 72 (04/20 2300)  Labs:  Recent Labs  09/18/14 0615 09/18/14 1435 09/18/14 1650 09/18/14 2045  HGB 13.0  --   --   --   HCT 37.6*  --   --   --   PLT 146*  --   --   --   APTT 76*  --   --   --   LABPROT 16.5*  --   --   --   INR 1.32  --   --   --   HEPARINUNFRC 0.22* 0.40  --  0.37  CREATININE 0.96  --   --   --   TROPONINI  --   --  2.63*  --     Estimated Creatinine Clearance: 71.9 mL/min (by C-G formula based on Cr of 0.96).    Assessment: 81 year old man admitted 09/17/2014 on transfer to [facility] from [facility]. 4/19 cath, Critical CAD found. To be evaluated for CABG.s/p Plavix (last 4/19). PRU 242.  Anticoag: IV heparin for critical CAD. Heparin level 0.37 remains therapeutic x2 on 1500 units/hr heparin drip. No bleeding noted. Troponins 2.63.  Pt with recurrent chest pain 5/10, HR 90s BP 118/73 at 20:58, MD aware and monitoring. Plan is for CABG on Friday 4/22.  Goal of Therapy:  Heparin level 0.3-0.7 units/ml Monitor platelets by anticoagulation protocol: Yes   Plan:  Continue heparin at 1500 units/hr Daily HL and CBC.   Nicole Cella, RPh Clinical Pharmacist Pager: [phone] 09/19/2014,12:10 AM

## 2014-09-19 NOTE — Progress Notes (Addendum)
Pt reports CP 3/10. 1 SL NTG given with relief. Pt reports pain as 1/10. Will continue to monitor pt closely.

## 2014-09-20 ENCOUNTER — Encounter: Payer: Self-pay | Admitting: Cardiovascular Disease

## 2014-09-20 ENCOUNTER — Inpatient Hospital Stay (HOSPITAL_COMMUNITY): Payer: Medicare Other | Admitting: Certified Registered"

## 2014-09-20 ENCOUNTER — Inpatient Hospital Stay (HOSPITAL_COMMUNITY): Payer: Medicare Other

## 2014-09-20 ENCOUNTER — Encounter (HOSPITAL_COMMUNITY)
Admission: AD | Disposition: A | Discharge status: Home Health | Payer: Medicare Other | Source: Other Acute Inpatient Hospital | Attending: Thoracic Surgery (Cardiothoracic Vascular Surgery)

## 2014-09-20 DIAGNOSIS — Z951 Presence of aortocoronary bypass graft: Secondary | ICD-10-CM

## 2014-09-20 HISTORY — PX: TEE WITHOUT CARDIOVERSION: SHX5443

## 2014-09-20 HISTORY — PX: CORONARY ARTERY BYPASS GRAFT: SHX141

## 2014-09-20 LAB — CBC
HCT: 38.4 % — ABNORMAL LOW (ref 39.0–52.0)
HCT: 34.4 % — ABNORMAL LOW (ref 39.0–52.0)
HEMATOCRIT: 31.9 % — AB (ref 39.0–52.0)
Hemoglobin: 13.1 g/dL (ref 13.0–17.0)
Hemoglobin: 12.1 g/dL — ABNORMAL LOW (ref 13.0–17.0)
Hemoglobin: 11.3 g/dL — ABNORMAL LOW (ref 13.0–17.0)
MCH: 30.3 pg (ref 26.0–34.0)
MCH: 30.4 pg (ref 26.0–34.0)
MCH: 30.7 pg (ref 26.0–34.0)
MCHC: 34.1 g/dL (ref 30.0–36.0)
MCHC: 35.2 g/dL (ref 30.0–36.0)
MCHC: 35.4 g/dL (ref 30.0–36.0)
MCV: 88.9 fL (ref 78.0–100.0)
MCV: 86.4 fL (ref 78.0–100.0)
MCV: 86.7 fL (ref 78.0–100.0)
Platelets: 153 10*3/uL (ref 150–400)
Platelets: 124 10*3/uL — ABNORMAL LOW (ref 150–400)
Platelets: 121 10*3/uL — ABNORMAL LOW (ref 150–400)
RBC: 4.32 MIL/uL (ref 4.22–5.81)
RBC: 3.98 MIL/uL — AB (ref 4.22–5.81)
RBC: 3.68 MIL/uL — AB (ref 4.22–5.81)
RDW: 15 % (ref 11.5–15.5)
RDW: 14.9 % (ref 11.5–15.5)
RDW: 14.9 % (ref 11.5–15.5)
WBC: 14 10*3/uL — AB (ref 4.0–10.5)
WBC: 24.1 10*3/uL — AB (ref 4.0–10.5)
WBC: 17.2 10*3/uL — ABNORMAL HIGH (ref 4.0–10.5)

## 2014-09-20 LAB — POCT I-STAT, CHEM 8
BUN: 26 mg/dL — AB (ref 6–23)
BUN: 25 mg/dL — AB (ref 6–23)
BUN: 28 mg/dL — AB (ref 6–23)
BUN: 26 mg/dL — AB (ref 6–23)
BUN: 26 mg/dL — AB (ref 6–23)
BUN: 24 mg/dL — AB (ref 6–23)
CALCIUM ION: 1.16 mmol/L (ref 1.13–1.30)
CALCIUM ION: 1.05 mmol/L — AB (ref 1.13–1.30)
CALCIUM ION: 1.02 mmol/L — AB (ref 1.13–1.30)
CHLORIDE: 94 mmol/L — AB (ref 96–112)
CHLORIDE: 96 mmol/L (ref 96–112)
CREATININE: 1.2 mg/dL (ref 0.50–1.35)
CREATININE: 1.2 mg/dL (ref 0.50–1.35)
CREATININE: 1.1 mg/dL (ref 0.50–1.35)
Calcium, Ion: 1.21 mmol/L (ref 1.13–1.30)
Calcium, Ion: 0.96 mmol/L — ABNORMAL LOW (ref 1.13–1.30)
Calcium, Ion: 1.08 mmol/L — ABNORMAL LOW (ref 1.13–1.30)
Chloride: 100 mmol/L (ref 96–112)
Chloride: 98 mmol/L (ref 96–112)
Chloride: 97 mmol/L (ref 96–112)
Chloride: 99 mmol/L (ref 96–112)
Creatinine, Ser: 1.1 mg/dL (ref 0.50–1.35)
Creatinine, Ser: 1.1 mg/dL (ref 0.50–1.35)
Creatinine, Ser: 0.9 mg/dL (ref 0.50–1.35)
GLUCOSE: 285 mg/dL — AB (ref 70–99)
GLUCOSE: 151 mg/dL — AB (ref 70–99)
Glucose, Bld: 222 mg/dL — ABNORMAL HIGH (ref 70–99)
Glucose, Bld: 256 mg/dL — ABNORMAL HIGH (ref 70–99)
Glucose, Bld: 277 mg/dL — ABNORMAL HIGH (ref 70–99)
Glucose, Bld: 272 mg/dL — ABNORMAL HIGH (ref 70–99)
HCT: 36 % — ABNORMAL LOW (ref 39.0–52.0)
HCT: 30 % — ABNORMAL LOW (ref 39.0–52.0)
HCT: 37 % — ABNORMAL LOW (ref 39.0–52.0)
HCT: 27 % — ABNORMAL LOW (ref 39.0–52.0)
HEMATOCRIT: 32 % — AB (ref 39.0–52.0)
HEMATOCRIT: 32 % — AB (ref 39.0–52.0)
HEMOGLOBIN: 10.2 g/dL — AB (ref 13.0–17.0)
HEMOGLOBIN: 12.6 g/dL — AB (ref 13.0–17.0)
HEMOGLOBIN: 10.9 g/dL — AB (ref 13.0–17.0)
Hemoglobin: 12.2 g/dL — ABNORMAL LOW (ref 13.0–17.0)
Hemoglobin: 9.2 g/dL — ABNORMAL LOW (ref 13.0–17.0)
Hemoglobin: 10.9 g/dL — ABNORMAL LOW (ref 13.0–17.0)
POTASSIUM: 4.7 mmol/L (ref 3.5–5.1)
Potassium: 4.3 mmol/L (ref 3.5–5.1)
Potassium: 3.9 mmol/L (ref 3.5–5.1)
Potassium: 5.3 mmol/L — ABNORMAL HIGH (ref 3.5–5.1)
Potassium: 4.3 mmol/L (ref 3.5–5.1)
Potassium: 3.6 mmol/L (ref 3.5–5.1)
SODIUM: 129 mmol/L — AB (ref 135–145)
Sodium: 131 mmol/L — ABNORMAL LOW (ref 135–145)
Sodium: 130 mmol/L — ABNORMAL LOW (ref 135–145)
Sodium: 130 mmol/L — ABNORMAL LOW (ref 135–145)
Sodium: 133 mmol/L — ABNORMAL LOW (ref 135–145)
Sodium: 135 mmol/L (ref 135–145)
TCO2: 17 mmol/L (ref 0–100)
TCO2: 16 mmol/L (ref 0–100)
TCO2: 18 mmol/L (ref 0–100)
TCO2: 20 mmol/L (ref 0–100)
TCO2: 20 mmol/L (ref 0–100)
TCO2: 22 mmol/L (ref 0–100)

## 2014-09-20 LAB — POCT I-STAT 3, ART BLOOD GAS (G3+)
ACID-BASE DEFICIT: 9 mmol/L — AB (ref 0.0–2.0)
ACID-BASE DEFICIT: 6 mmol/L — AB (ref 0.0–2.0)
ACID-BASE DEFICIT: 4 mmol/L — AB (ref 0.0–2.0)
ACID-BASE DEFICIT: 3 mmol/L — AB (ref 0.0–2.0)
ACID-BASE DEFICIT: 1 mmol/L (ref 0.0–2.0)
ACID-BASE EXCESS: 1 mmol/L (ref 0.0–2.0)
Acid-Base Excess: 2 mmol/L (ref 0.0–2.0)
Acid-base deficit: 6 mmol/L — ABNORMAL HIGH (ref 0.0–2.0)
Acid-base deficit: 4 mmol/L — ABNORMAL HIGH (ref 0.0–2.0)
BICARBONATE: 18.1 meq/L — AB (ref 20.0–24.0)
BICARBONATE: 20.4 meq/L (ref 20.0–24.0)
Bicarbonate: 21.1 mEq/L (ref 20.0–24.0)
Bicarbonate: 22.2 mEq/L (ref 20.0–24.0)
Bicarbonate: 22.4 mEq/L (ref 20.0–24.0)
Bicarbonate: 21.9 mEq/L (ref 20.0–24.0)
Bicarbonate: 24.4 mEq/L — ABNORMAL HIGH (ref 20.0–24.0)
Bicarbonate: 25.4 mEq/L — ABNORMAL HIGH (ref 20.0–24.0)
Bicarbonate: 23 mEq/L (ref 20.0–24.0)
O2 SAT: 77 %
O2 SAT: 100 %
O2 SAT: 63 %
O2 SAT: 77 %
O2 SAT: 82 %
O2 SAT: 92 %
O2 SAT: 96 %
O2 SAT: 89 %
O2 Saturation: 95 %
PCO2 ART: 42.6 mmHg (ref 35.0–45.0)
PCO2 ART: 37 mmHg (ref 35.0–45.0)
PCO2 ART: 35.6 mmHg (ref 35.0–45.0)
PH ART: 7.288 — AB (ref 7.350–7.450)
PH ART: 7.374 (ref 7.350–7.450)
PO2 ART: 53 mmHg — AB (ref 80.0–100.0)
PO2 ART: 413 mmHg — AB (ref 80.0–100.0)
PO2 ART: 47 mmHg — AB (ref 80.0–100.0)
PO2 ART: 59 mmHg — AB (ref 80.0–100.0)
Patient temperature: 38.1
Patient temperature: 37.4
Patient temperature: 38
TCO2: 19 mmol/L (ref 0–100)
TCO2: 23 mmol/L (ref 0–100)
TCO2: 22 mmol/L (ref 0–100)
TCO2: 24 mmol/L (ref 0–100)
TCO2: 24 mmol/L (ref 0–100)
TCO2: 23 mmol/L (ref 0–100)
TCO2: 25 mmol/L (ref 0–100)
TCO2: 27 mmol/L (ref 0–100)
TCO2: 24 mmol/L (ref 0–100)
pCO2 arterial: 45.8 mmHg — ABNORMAL HIGH (ref 35.0–45.0)
pCO2 arterial: 47.9 mmHg — ABNORMAL HIGH (ref 35.0–45.0)
pCO2 arterial: 46.3 mmHg — ABNORMAL HIGH (ref 35.0–45.0)
pCO2 arterial: 44.9 mmHg (ref 35.0–45.0)
pCO2 arterial: 37.6 mmHg (ref 35.0–45.0)
pCO2 arterial: 35.9 mmHg (ref 35.0–45.0)
pH, Arterial: 7.211 — ABNORMAL LOW (ref 7.350–7.450)
pH, Arterial: 7.252 — ABNORMAL LOW (ref 7.350–7.450)
pH, Arterial: 7.288 — ABNORMAL LOW (ref 7.350–7.450)
pH, Arterial: 7.307 — ABNORMAL LOW (ref 7.350–7.450)
pH, Arterial: 7.44 (ref 7.350–7.450)
pH, Arterial: 7.446 (ref 7.350–7.450)
pH, Arterial: 7.422 (ref 7.350–7.450)
pO2, Arterial: 36 mmHg — CL (ref 80.0–100.0)
pO2, Arterial: 52 mmHg — ABNORMAL LOW (ref 80.0–100.0)
pO2, Arterial: 66 mmHg — ABNORMAL LOW (ref 80.0–100.0)
pO2, Arterial: 77 mmHg — ABNORMAL LOW (ref 80.0–100.0)
pO2, Arterial: 72 mmHg — ABNORMAL LOW (ref 80.0–100.0)

## 2014-09-20 LAB — GLUCOSE, CAPILLARY
GLUCOSE-CAPILLARY: 131 mg/dL — AB (ref 70–99)
GLUCOSE-CAPILLARY: 136 mg/dL — AB (ref 70–99)
GLUCOSE-CAPILLARY: 85 mg/dL (ref 70–99)
GLUCOSE-CAPILLARY: 96 mg/dL (ref 70–99)
GLUCOSE-CAPILLARY: 107 mg/dL — AB (ref 70–99)
Glucose-Capillary: 146 mg/dL — ABNORMAL HIGH (ref 70–99)
Glucose-Capillary: 177 mg/dL — ABNORMAL HIGH (ref 70–99)
Glucose-Capillary: 160 mg/dL — ABNORMAL HIGH (ref 70–99)
Glucose-Capillary: 119 mg/dL — ABNORMAL HIGH (ref 70–99)
Glucose-Capillary: 112 mg/dL — ABNORMAL HIGH (ref 70–99)

## 2014-09-20 LAB — POCT I-STAT 4, (NA,K, GLUC, HGB,HCT)
Glucose, Bld: 218 mg/dL — ABNORMAL HIGH (ref 70–99)
HCT: 36 % — ABNORMAL LOW (ref 39.0–52.0)
Hemoglobin: 12.2 g/dL — ABNORMAL LOW (ref 13.0–17.0)
POTASSIUM: 4 mmol/L (ref 3.5–5.1)
Sodium: 134 mmol/L — ABNORMAL LOW (ref 135–145)

## 2014-09-20 LAB — CREATININE, SERUM
Creatinine, Ser: 1.02 mg/dL (ref 0.50–1.35)
GFR calc Af Amer: 78 mL/min — ABNORMAL LOW (ref >90)
GFR calc non Af Amer: 67 mL/min — ABNORMAL LOW (ref >90)

## 2014-09-20 LAB — HEMOGLOBIN AND HEMATOCRIT, BLOOD
HEMATOCRIT: 27.2 % — AB (ref 39.0–52.0)
HEMOGLOBIN: 9.6 g/dL — AB (ref 13.0–17.0)

## 2014-09-20 LAB — HEPARIN LEVEL (UNFRACTIONATED): Heparin Unfractionated: 0.22 IU/mL — ABNORMAL LOW (ref 0.30–0.70)

## 2014-09-20 LAB — PROTIME-INR
INR: 1.85 — ABNORMAL HIGH (ref 0.00–1.49)
Prothrombin Time: 21.5 seconds — ABNORMAL HIGH (ref 11.6–15.2)

## 2014-09-20 LAB — APTT: APTT: 39 s — AB (ref 24–37)

## 2014-09-20 LAB — MAGNESIUM: Magnesium: 2.9 mg/dL — ABNORMAL HIGH (ref 1.5–2.5)

## 2014-09-20 LAB — PLATELET COUNT: PLATELETS: 130 10*3/uL — AB (ref 150–400)

## 2014-09-20 SURGERY — CORONARY ARTERY BYPASS GRAFTING (CABG)
Anesthesia: General | Site: Chest

## 2014-09-20 MED ORDER — ACETAMINOPHEN 500 MG PO TABS
1000.0000 mg | ORAL_TABLET | Freq: Four times a day (QID) | ORAL | Status: AC
  Administered 2014-09-21 – 2014-09-25 (×17): 1000 mg via ORAL
  Filled 2014-09-20 (×18): qty 2

## 2014-09-20 MED ORDER — MIDAZOLAM HCL 2 MG/2ML IJ SOLN
INTRAMUSCULAR | Status: AC
  Filled 2014-09-20: qty 2

## 2014-09-20 MED ORDER — DOPAMINE-DEXTROSE 3.2-5 MG/ML-% IV SOLN
3.0000 ug/kg/min | INTRAVENOUS | Status: DC
  Administered 2014-09-21: 3 ug/kg/min via INTRAVENOUS
  Filled 2014-09-20 (×2): qty 250

## 2014-09-20 MED ORDER — ALBUMIN HUMAN 5 % IV SOLN
250.0000 mL | INTRAVENOUS | Status: AC | PRN
  Administered 2014-09-20 (×4): 250 mL via INTRAVENOUS
  Filled 2014-09-20: qty 250

## 2014-09-20 MED ORDER — HEPARIN (PORCINE) IN NACL 100-0.45 UNIT/ML-% IJ SOLN
1600.0000 [IU]/h | INTRAMUSCULAR | Status: DC
  Administered 2014-09-20: 1600 [IU]/h via INTRAVENOUS
  Filled 2014-09-20 (×2): qty 250

## 2014-09-20 MED ORDER — SODIUM BICARBONATE 8.4 % IV SOLN
50.0000 meq | Freq: Once | INTRAVENOUS | Status: AC
  Administered 2014-09-20: 50 meq via INTRAVENOUS

## 2014-09-20 MED ORDER — SODIUM CHLORIDE 0.9 % IV SOLN: INTRAVENOUS | Status: DC

## 2014-09-20 MED ORDER — POTASSIUM CHLORIDE 10 MEQ/50ML IV SOLN: 10.0000 meq | INTRAVENOUS | Status: AC

## 2014-09-20 MED ORDER — SODIUM BICARBONATE 8.4 % IV SOLN
INTRAVENOUS | Status: DC | PRN
  Administered 2014-09-20 (×2): 50 meq via INTRAVENOUS

## 2014-09-20 MED ORDER — MIDAZOLAM HCL 10 MG/2ML IJ SOLN
INTRAMUSCULAR | Status: AC
  Filled 2014-09-20: qty 2

## 2014-09-20 MED ORDER — MORPHINE SULFATE 2 MG/ML IJ SOLN
2.0000 mg | INTRAMUSCULAR | Status: DC | PRN
  Administered 2014-09-21 (×6): 2 mg via INTRAVENOUS
  Administered 2014-09-22 (×2): 4 mg via INTRAVENOUS
  Administered 2014-09-22 – 2014-09-23 (×2): 2 mg via INTRAVENOUS
  Filled 2014-09-20 (×8): qty 1
  Filled 2014-09-20 (×2): qty 2

## 2014-09-20 MED ORDER — FENTANYL CITRATE (PF) 250 MCG/5ML IJ SOLN
INTRAMUSCULAR | Status: AC
  Filled 2014-09-20: qty 5

## 2014-09-20 MED ORDER — LACTATED RINGERS IV SOLN: INTRAVENOUS | Status: DC

## 2014-09-20 MED ORDER — INSULIN REGULAR BOLUS VIA INFUSION
0.0000 [IU] | Freq: Three times a day (TID) | INTRAVENOUS | Status: DC
  Filled 2014-09-20: qty 10

## 2014-09-20 MED ORDER — PROTAMINE SULFATE 10 MG/ML IV SOLN
INTRAVENOUS | Status: AC
  Filled 2014-09-20: qty 25

## 2014-09-20 MED ORDER — DEXTROSE 5 % IV SOLN
4000.0000 ug | INTRAVENOUS | Status: DC | PRN
  Administered 2014-09-20: 2 ug/min via INTRAVENOUS

## 2014-09-20 MED ORDER — SODIUM CHLORIDE 0.9 % IJ SOLN: 3.0000 mL | INTRAMUSCULAR | Status: DC | PRN

## 2014-09-20 MED ORDER — LIDOCAINE HCL (CARDIAC) 20 MG/ML IV SOLN
INTRAVENOUS | Status: AC
  Filled 2014-09-20: qty 5

## 2014-09-20 MED ORDER — PHENYLEPHRINE HCL 10 MG/ML IJ SOLN
20.0000 mg | INTRAVENOUS | Status: DC | PRN
  Administered 2014-09-20: 50 ug/min via INTRAVENOUS

## 2014-09-20 MED ORDER — NOREPINEPHRINE BITARTRATE 1 MG/ML IV SOLN
0.0000 ug/min | INTRAVENOUS | Status: DC
  Administered 2014-09-20: 14 ug/min via INTRAVENOUS
  Administered 2014-09-20: 5 ug/min via INTRAVENOUS
  Administered 2014-09-21: 10 ug/min via INTRAVENOUS
  Administered 2014-09-21: 9 ug/min via INTRAVENOUS
  Administered 2014-09-22: 4 ug/min via INTRAVENOUS
  Filled 2014-09-20 (×6): qty 4

## 2014-09-20 MED ORDER — DEXTROSE 5 % IV SOLN
1.5000 g | Freq: Two times a day (BID) | INTRAVENOUS | Status: AC
  Administered 2014-09-20 – 2014-09-22 (×4): 1.5 g via INTRAVENOUS
  Filled 2014-09-20 (×4): qty 1.5

## 2014-09-20 MED ORDER — HEPARIN SODIUM (PORCINE) 1000 UNIT/ML IJ SOLN
INTRAMUSCULAR | Status: AC
  Filled 2014-09-20: qty 1

## 2014-09-20 MED ORDER — METOPROLOL TARTRATE 12.5 MG HALF TABLET
12.5000 mg | ORAL_TABLET | Freq: Two times a day (BID) | ORAL | Status: DC
  Administered 2014-09-21 – 2014-09-23 (×6): 12.5 mg via ORAL
  Filled 2014-09-20 (×9): qty 1

## 2014-09-20 MED ORDER — EPHEDRINE SULFATE 50 MG/ML IJ SOLN
INTRAMUSCULAR | Status: DC | PRN
  Administered 2014-09-20: 15 mg via INTRAVENOUS
  Administered 2014-09-20: 10 mg via INTRAVENOUS

## 2014-09-20 MED ORDER — ARTIFICIAL TEARS OP OINT
TOPICAL_OINTMENT | OPHTHALMIC | Status: DC | PRN
  Administered 2014-09-20: 1 via OPHTHALMIC

## 2014-09-20 MED ORDER — DOPAMINE-DEXTROSE 3.2-5 MG/ML-% IV SOLN
INTRAVENOUS | Status: DC | PRN
  Administered 2014-09-20: 3 ug/kg/min via INTRAVENOUS

## 2014-09-20 MED ORDER — AMIODARONE HCL IN DEXTROSE 360-4.14 MG/200ML-% IV SOLN
30.0000 mg/h | INTRAVENOUS | Status: AC
Start: 2014-09-20 — End: 2014-09-23
  Administered 2014-09-20 – 2014-09-22 (×4): 30 mg/h via INTRAVENOUS
  Filled 2014-09-20 (×11): qty 200

## 2014-09-20 MED ORDER — SODIUM CHLORIDE 0.9 % IV SOLN
INTRAVENOUS | Status: DC
  Administered 2014-09-20: 22:00:00 via INTRAVENOUS
  Filled 2014-09-20 (×2): qty 2.5

## 2014-09-20 MED ORDER — SODIUM CHLORIDE 0.9 % IJ SOLN
OROMUCOSAL | Status: DC | PRN
  Administered 2014-09-20 (×4): via TOPICAL

## 2014-09-20 MED ORDER — MIDAZOLAM HCL 5 MG/5ML IJ SOLN
INTRAMUSCULAR | Status: DC | PRN
  Administered 2014-09-20: 3 mg via INTRAVENOUS
  Administered 2014-09-20: 2 mg via INTRAVENOUS
  Administered 2014-09-20: 1 mg via INTRAVENOUS
  Administered 2014-09-20 (×3): 2 mg via INTRAVENOUS

## 2014-09-20 MED ORDER — MAGNESIUM SULFATE 4 GM/100ML IV SOLN
4.0000 g | Freq: Once | INTRAVENOUS | Status: AC
  Administered 2014-09-20: 4 g via INTRAVENOUS
  Filled 2014-09-20: qty 100

## 2014-09-20 MED ORDER — PHENYLEPHRINE HCL 10 MG/ML IJ SOLN
0.0000 ug/min | INTRAVENOUS | Status: DC
  Filled 2014-09-20: qty 2

## 2014-09-20 MED ORDER — BISACODYL 10 MG RE SUPP
10.0000 mg | Freq: Every day | RECTAL | Status: DC
  Filled 2014-09-20: qty 1

## 2014-09-20 MED ORDER — METOPROLOL TARTRATE 1 MG/ML IV SOLN: 2.5000 mg | INTRAVENOUS | Status: DC | PRN

## 2014-09-20 MED ORDER — PROPOFOL 10 MG/ML IV BOLUS
INTRAVENOUS | Status: DC | PRN
  Administered 2014-09-20: 40 mg via INTRAVENOUS

## 2014-09-20 MED ORDER — ASPIRIN EC 325 MG PO TBEC
325.0000 mg | DELAYED_RELEASE_TABLET | Freq: Every day | ORAL | Status: DC
  Administered 2014-09-21 – 2014-09-22 (×2): 325 mg via ORAL
  Filled 2014-09-20 (×3): qty 1

## 2014-09-20 MED ORDER — ACETAMINOPHEN 160 MG/5ML PO SOLN
1000.0000 mg | Freq: Four times a day (QID) | ORAL | Status: AC
  Administered 2014-09-20 – 2014-09-21 (×2): 1000 mg
  Filled 2014-09-20 (×2): qty 40.6

## 2014-09-20 MED ORDER — NOREPINEPHRINE BITARTRATE 1 MG/ML IV SOLN
0.0000 ug/min | INTRAVENOUS | Status: DC
  Filled 2014-09-20 (×2): qty 4

## 2014-09-20 MED ORDER — HEMOSTATIC AGENTS (NO CHARGE) OPTIME
TOPICAL | Status: DC | PRN
  Administered 2014-09-20 (×2): 1 via TOPICAL

## 2014-09-20 MED ORDER — ONDANSETRON HCL 4 MG/2ML IJ SOLN: 4.0000 mg | Freq: Four times a day (QID) | INTRAMUSCULAR | Status: DC | PRN

## 2014-09-20 MED ORDER — ALBUTEROL SULFATE HFA 108 (90 BASE) MCG/ACT IN AERS
INHALATION_SPRAY | RESPIRATORY_TRACT | Status: DC | PRN
  Administered 2014-09-20 (×4): 3 via RESPIRATORY_TRACT

## 2014-09-20 MED ORDER — PROPOFOL 10 MG/ML IV BOLUS
INTRAVENOUS | Status: AC
  Filled 2014-09-20: qty 20

## 2014-09-20 MED ORDER — LACTATED RINGERS IV SOLN
500.0000 mL | Freq: Once | INTRAVENOUS | Status: AC | PRN
  Administered 2014-09-20: 500 mL via INTRAVENOUS

## 2014-09-20 MED ORDER — TRAMADOL HCL 50 MG PO TABS: 50.0000 mg | ORAL_TABLET | ORAL | Status: DC | PRN

## 2014-09-20 MED ORDER — SODIUM CHLORIDE 0.9 % IJ SOLN
3.0000 mL | Freq: Two times a day (BID) | INTRAMUSCULAR | Status: DC
  Administered 2014-09-22 – 2014-09-23 (×4): 3 mL via INTRAVENOUS

## 2014-09-20 MED ORDER — GI COCKTAIL ~~LOC~~
30.0000 mL | Freq: Once | ORAL | Status: AC
  Administered 2014-09-20: 30 mL via ORAL
  Filled 2014-09-20: qty 30

## 2014-09-20 MED ORDER — SODIUM CHLORIDE 0.9 % IV SOLN: 250.0000 mL | INTRAVENOUS | Status: DC

## 2014-09-20 MED ORDER — VANCOMYCIN HCL IN DEXTROSE 1-5 GM/200ML-% IV SOLN
1000.0000 mg | Freq: Once | INTRAVENOUS | Status: AC
  Administered 2014-09-20: 1000 mg via INTRAVENOUS
  Filled 2014-09-20: qty 200

## 2014-09-20 MED ORDER — HEPARIN SODIUM (PORCINE) 1000 UNIT/ML IJ SOLN
INTRAMUSCULAR | Status: DC | PRN
  Administered 2014-09-20: 25000 [IU] via INTRAVENOUS
  Administered 2014-09-20: 5000 [IU] via INTRAVENOUS

## 2014-09-20 MED ORDER — ACETAMINOPHEN 160 MG/5ML PO SOLN: 650.0000 mg | Freq: Once | ORAL | Status: AC

## 2014-09-20 MED ORDER — DEXMEDETOMIDINE HCL IN NACL 400 MCG/100ML IV SOLN
0.0000 ug/kg/h | INTRAVENOUS | Status: DC
  Administered 2014-09-20 (×2): 0.7 ug/kg/h via INTRAVENOUS
  Filled 2014-09-20 (×3): qty 50

## 2014-09-20 MED ORDER — MIDAZOLAM HCL 2 MG/2ML IJ SOLN: 2.0000 mg | INTRAMUSCULAR | Status: DC | PRN

## 2014-09-20 MED ORDER — ASPIRIN 81 MG PO CHEW: 324.0000 mg | CHEWABLE_TABLET | Freq: Every day | ORAL | Status: DC

## 2014-09-20 MED ORDER — ACETAMINOPHEN 650 MG RE SUPP
650.0000 mg | Freq: Once | RECTAL | Status: AC
  Administered 2014-09-20: 650 mg via RECTAL

## 2014-09-20 MED ORDER — LIDOCAINE HCL (CARDIAC) 20 MG/ML IV SOLN
INTRAVENOUS | Status: DC | PRN
  Administered 2014-09-20: 100 mg via INTRAVENOUS

## 2014-09-20 MED ORDER — ROCURONIUM BROMIDE 100 MG/10ML IV SOLN
INTRAVENOUS | Status: DC | PRN
Start: 2014-09-20 — End: 2014-09-20
  Administered 2014-09-20: 20 mg via INTRAVENOUS

## 2014-09-20 MED ORDER — NITROGLYCERIN IN D5W 200-5 MCG/ML-% IV SOLN: 0.0000 ug/min | INTRAVENOUS | Status: DC

## 2014-09-20 MED ORDER — FAMOTIDINE IN NACL 20-0.9 MG/50ML-% IV SOLN
20.0000 mg | Freq: Two times a day (BID) | INTRAVENOUS | Status: AC
  Administered 2014-09-20 (×2): 20 mg via INTRAVENOUS
  Filled 2014-09-20: qty 50

## 2014-09-20 MED ORDER — SODIUM CHLORIDE 0.45 % IV SOLN
INTRAVENOUS | Status: DC | PRN
  Administered 2014-09-20: 12:00:00 via INTRAVENOUS

## 2014-09-20 MED ORDER — LACTATED RINGERS IV SOLN
INTRAVENOUS | Status: DC | PRN
  Administered 2014-09-20: 07:00:00 via INTRAVENOUS

## 2014-09-20 MED ORDER — PROPRANOLOL HCL 1 MG/ML IV SOLN
INTRAVENOUS | Status: AC
  Filled 2014-09-20: qty 1

## 2014-09-20 MED ORDER — LACTATED RINGERS IV SOLN
INTRAVENOUS | Status: DC
  Administered 2014-09-20: 13:00:00 via INTRAVENOUS

## 2014-09-20 MED ORDER — MORPHINE SULFATE 2 MG/ML IJ SOLN
1.0000 mg | INTRAMUSCULAR | Status: AC | PRN
  Administered 2014-09-20: 2 mg via INTRAVENOUS
  Filled 2014-09-20: qty 1

## 2014-09-20 MED ORDER — OXYCODONE HCL 5 MG PO TABS
5.0000 mg | ORAL_TABLET | ORAL | Status: DC | PRN
  Administered 2014-09-22 – 2014-09-23 (×5): 5 mg via ORAL
  Administered 2014-09-23 – 2014-09-24 (×3): 10 mg via ORAL
  Filled 2014-09-20: qty 2
  Filled 2014-09-20: qty 1
  Filled 2014-09-20: qty 2
  Filled 2014-09-20 (×4): qty 1
  Filled 2014-09-20 (×2): qty 2

## 2014-09-20 MED ORDER — POTASSIUM CHLORIDE 10 MEQ/50ML IV SOLN
10.0000 meq | INTRAVENOUS | Status: AC
  Administered 2014-09-20 (×3): 10 meq via INTRAVENOUS
  Filled 2014-09-20: qty 50

## 2014-09-20 MED ORDER — CALCIUM CHLORIDE 10 % IV SOLN
1.0000 g | Freq: Once | INTRAVENOUS | Status: AC
  Administered 2014-09-20: 1 g via INTRAVENOUS
  Filled 2014-09-20: qty 10

## 2014-09-20 MED ORDER — CHLORHEXIDINE GLUCONATE 0.12 % MT SOLN
15.0000 mL | Freq: Once | OROMUCOSAL | Status: AC
Start: 2014-09-20 — End: 2014-09-20
  Administered 2014-09-20: 15 mL via OROMUCOSAL
  Filled 2014-09-20: qty 15

## 2014-09-20 MED ORDER — PANTOPRAZOLE SODIUM 40 MG PO TBEC
40.0000 mg | DELAYED_RELEASE_TABLET | Freq: Every day | ORAL | Status: DC
  Administered 2014-09-22 – 2014-09-26 (×5): 40 mg via ORAL
  Filled 2014-09-20 (×5): qty 1

## 2014-09-20 MED ORDER — 0.9 % SODIUM CHLORIDE (POUR BTL) OPTIME
TOPICAL | Status: DC | PRN
  Administered 2014-09-20: 6000 mL

## 2014-09-20 MED ORDER — AMIODARONE HCL IN DEXTROSE 360-4.14 MG/200ML-% IV SOLN
60.0000 mg/h | INTRAVENOUS | Status: DC
  Filled 2014-09-20: qty 200

## 2014-09-20 MED ORDER — VECURONIUM BROMIDE 10 MG IV SOLR
INTRAVENOUS | Status: DC | PRN
  Administered 2014-09-20 (×2): 5 mg via INTRAVENOUS
  Administered 2014-09-20: 10 mg via INTRAVENOUS

## 2014-09-20 MED ORDER — FENTANYL CITRATE (PF) 100 MCG/2ML IJ SOLN
INTRAMUSCULAR | Status: DC | PRN
  Administered 2014-09-20: 100 ug via INTRAVENOUS
  Administered 2014-09-20: 250 ug via INTRAVENOUS
  Administered 2014-09-20: 100 ug via INTRAVENOUS
  Administered 2014-09-20: 250 ug via INTRAVENOUS
  Administered 2014-09-20: 150 ug via INTRAVENOUS

## 2014-09-20 MED ORDER — BISACODYL 5 MG PO TBEC
10.0000 mg | DELAYED_RELEASE_TABLET | Freq: Every day | ORAL | Status: DC
  Administered 2014-09-22 – 2014-09-26 (×4): 10 mg via ORAL
  Filled 2014-09-20 (×4): qty 2

## 2014-09-20 MED ORDER — DOCUSATE SODIUM 100 MG PO CAPS
200.0000 mg | ORAL_CAPSULE | Freq: Every day | ORAL | Status: DC
  Administered 2014-09-22 – 2014-09-26 (×4): 200 mg via ORAL
  Filled 2014-09-20 (×5): qty 2

## 2014-09-20 MED ORDER — PROTAMINE SULFATE 10 MG/ML IV SOLN
INTRAVENOUS | Status: DC | PRN
Start: 2014-09-20 — End: 2014-09-20
  Administered 2014-09-20: 250 mg via INTRAVENOUS

## 2014-09-20 MED ORDER — METOPROLOL TARTRATE 25 MG/10 ML ORAL SUSPENSION
12.5000 mg | Freq: Two times a day (BID) | ORAL | Status: DC
  Filled 2014-09-20 (×9): qty 5

## 2014-09-20 MED FILL — Sodium Chloride IV Soln 0.9%: INTRAVENOUS | Qty: 2000 | Status: AC

## 2014-09-20 MED FILL — Electrolyte-R (PH 7.4) Solution: INTRAVENOUS | Qty: 4000 | Status: AC

## 2014-09-20 MED FILL — Lidocaine HCl IV Inj 20 MG/ML: INTRAVENOUS | Qty: 10 | Status: AC

## 2014-09-20 MED FILL — Mannitol IV Soln 20%: INTRAVENOUS | Qty: 500 | Status: AC

## 2014-09-20 MED FILL — Heparin Sodium (Porcine) Inj 1000 Unit/ML: INTRAMUSCULAR | Qty: 10 | Status: AC

## 2014-09-20 MED FILL — Sodium Bicarbonate IV Soln 8.4%: INTRAVENOUS | Qty: 100 | Status: AC

## 2014-09-20 SURGICAL SUPPLY — 84 items
BAG DECANTER FOR FLEXI CONT (MISCELLANEOUS) ×3 IMPLANT
BANDAGE ELASTIC 4 VELCRO ST LF (GAUZE/BANDAGES/DRESSINGS) ×3 IMPLANT
BANDAGE ELASTIC 6 VELCRO ST LF (GAUZE/BANDAGES/DRESSINGS) ×3 IMPLANT
BASKET HEART (ORDER IN 25'S) (MISCELLANEOUS) ×1
BASKET HEART (ORDER IN 25S) (MISCELLANEOUS) ×2 IMPLANT
BLADE STERNUM SYSTEM 6 (BLADE) ×3 IMPLANT
BNDG GAUZE ELAST 4 BULKY (GAUZE/BANDAGES/DRESSINGS) ×3 IMPLANT
CANISTER SUCTION 2500CC (MISCELLANEOUS) ×3 IMPLANT
CANNULA EZ GLIDE AORTIC 21FR (CANNULA) ×3 IMPLANT
CATH CPB KIT HENDRICKSON (MISCELLANEOUS) ×3 IMPLANT
CATH ROBINSON RED A/P 18FR (CATHETERS) ×3 IMPLANT
CATH THORACIC 36FR (CATHETERS) ×3 IMPLANT
CATH THORACIC 36FR RT ANG (CATHETERS) ×3 IMPLANT
CLIP TI MEDIUM 24 (CLIP) IMPLANT
CLIP TI WIDE RED SMALL 24 (CLIP) ×6 IMPLANT
CRADLE DONUT ADULT HEAD (MISCELLANEOUS) ×3 IMPLANT
DRAPE CARDIOVASCULAR INCISE (DRAPES) ×1
DRAPE SLUSH/WARMER DISC (DRAPES) ×3 IMPLANT
DRAPE SRG 135X102X78XABS (DRAPES) ×2 IMPLANT
DRSG AQUACEL AG ADV 3.5X14 (GAUZE/BANDAGES/DRESSINGS) ×3 IMPLANT
DRSG COVADERM 4X14 (GAUZE/BANDAGES/DRESSINGS) ×3 IMPLANT
ELECT REM PT RETURN 9FT ADLT (ELECTROSURGICAL) ×6
ELECTRODE REM PT RTRN 9FT ADLT (ELECTROSURGICAL) ×4 IMPLANT
GAUZE SPONGE 4X4 12PLY STRL (GAUZE/BANDAGES/DRESSINGS) ×6 IMPLANT
GLOVE BIO SURGEON STRL SZ 6 (GLOVE) ×3 IMPLANT
GLOVE BIO SURGEON STRL SZ 6.5 (GLOVE) ×12 IMPLANT
GLOVE BIO SURGEON STRL SZ7.5 (GLOVE) ×6 IMPLANT
GLOVE SURG SIGNA 7.5 PF LTX (GLOVE) ×9 IMPLANT
GOWN STRL REUS W/ TWL LRG LVL3 (GOWN DISPOSABLE) ×8 IMPLANT
GOWN STRL REUS W/ TWL XL LVL3 (GOWN DISPOSABLE) ×4 IMPLANT
GOWN STRL REUS W/TWL LRG LVL3 (GOWN DISPOSABLE) ×4
GOWN STRL REUS W/TWL XL LVL3 (GOWN DISPOSABLE) ×2
HEMOSTAT POWDER SURGIFOAM 1G (HEMOSTASIS) ×9 IMPLANT
HEMOSTAT SURGICEL 2X14 (HEMOSTASIS) ×3 IMPLANT
INSERT FOGARTY XLG (MISCELLANEOUS) IMPLANT
KIT BASIN OR (CUSTOM PROCEDURE TRAY) ×3 IMPLANT
KIT ROOM TURNOVER OR (KITS) ×3 IMPLANT
KIT SUCTION CATH 14FR (SUCTIONS) ×6 IMPLANT
KIT VASOVIEW W/TROCAR VH 2000 (KITS) ×3 IMPLANT
MARKER GRAFT CORONARY BYPASS (MISCELLANEOUS) ×9 IMPLANT
NS IRRIG 1000ML POUR BTL (IV SOLUTION) ×18 IMPLANT
PACK OPEN HEART (CUSTOM PROCEDURE TRAY) ×3 IMPLANT
PAD ARMBOARD 7.5X6 YLW CONV (MISCELLANEOUS) ×6 IMPLANT
PAD ELECT DEFIB RADIOL ZOLL (MISCELLANEOUS) ×3 IMPLANT
PENCIL BUTTON HOLSTER BLD 10FT (ELECTRODE) ×3 IMPLANT
PUNCH AORTIC ROTATE 4.0MM (MISCELLANEOUS) IMPLANT
PUNCH AORTIC ROTATE 4.5MM 8IN (MISCELLANEOUS) ×3 IMPLANT
PUNCH AORTIC ROTATE 5MM 8IN (MISCELLANEOUS) IMPLANT
SET CARDIOPLEGIA MPS 5001102 (MISCELLANEOUS) ×3 IMPLANT
SPONGE GAUZE 4X4 12PLY STER LF (GAUZE/BANDAGES/DRESSINGS) ×6 IMPLANT
SPONGE LAP 4X18 X RAY DECT (DISPOSABLE) ×3 IMPLANT
SUT BONE WAX W31G (SUTURE) ×3 IMPLANT
SUT MNCRL AB 4-0 PS2 18 (SUTURE) IMPLANT
SUT PROLENE 3 0 SH DA (SUTURE) ×3 IMPLANT
SUT PROLENE 4 0 RB 1 (SUTURE) ×3
SUT PROLENE 4 0 SH DA (SUTURE) IMPLANT
SUT PROLENE 4-0 RB1 .5 CRCL 36 (SUTURE) ×6 IMPLANT
SUT PROLENE 5 0 C 1 36 (SUTURE) ×6 IMPLANT
SUT PROLENE 6 0 C 1 30 (SUTURE) ×6 IMPLANT
SUT PROLENE 7 0 BV1 MDA (SUTURE) ×6 IMPLANT
SUT PROLENE 8 0 BV175 6 (SUTURE) ×3 IMPLANT
SUT STEEL 6MS V (SUTURE) ×3 IMPLANT
SUT STEEL STERNAL CCS#1 18IN (SUTURE) IMPLANT
SUT STEEL SZ 6 DBL 3X14 BALL (SUTURE) ×3 IMPLANT
SUT VIC AB 1 CTX 36 (SUTURE) ×2
SUT VIC AB 1 CTX36XBRD ANBCTR (SUTURE) ×4 IMPLANT
SUT VIC AB 2-0 CT1 27 (SUTURE) ×1
SUT VIC AB 2-0 CT1 TAPERPNT 27 (SUTURE) ×2 IMPLANT
SUT VIC AB 2-0 CTX 27 (SUTURE) IMPLANT
SUT VIC AB 3-0 SH 27 (SUTURE)
SUT VIC AB 3-0 SH 27X BRD (SUTURE) IMPLANT
SUT VIC AB 3-0 X1 27 (SUTURE) ×3 IMPLANT
SUT VICRYL 4-0 PS2 18IN ABS (SUTURE) IMPLANT
SUTURE E-PAK OPEN HEART (SUTURE) ×3 IMPLANT
SYSTEM SAHARA CHEST DRAIN ATS (WOUND CARE) ×3 IMPLANT
TAPE CLOTH SURG 4X10 WHT LF (GAUZE/BANDAGES/DRESSINGS) ×3 IMPLANT
TAPE PAPER 2X10 WHT MICROPORE (GAUZE/BANDAGES/DRESSINGS) ×3 IMPLANT
TOWEL OR 17X24 6PK STRL BLUE (TOWEL DISPOSABLE) ×6 IMPLANT
TOWEL OR 17X26 10 PK STRL BLUE (TOWEL DISPOSABLE) ×6 IMPLANT
TRAY FOLEY IC TEMP SENS 16FR (CATHETERS) ×3 IMPLANT
TUBE FEEDING 8FR 16IN STR KANG (MISCELLANEOUS) ×3 IMPLANT
TUBING INSUFFLATION (TUBING) ×3 IMPLANT
UNDERPAD 30X30 INCONTINENT (UNDERPADS AND DIAPERS) ×3 IMPLANT
WATER STERILE IRR 1000ML POUR (IV SOLUTION) ×6 IMPLANT

## 2014-09-20 NOTE — Op Note (Signed)
PATIENT NAME:  Christopher [name], Christopher [name] MR#:  [id number] DATE OF BIRTH:  1931/10/09  DATE OF PROCEDURE:  07/17/2012  PREOPERATIVE DIAGNOSIS: Degenerative arthrosis of the right knee.   POSTOPERATIVE DIAGNOSIS: Degenerative arthrosis of the right knee.   PROCEDURE PERFORMED: Right total knee arthroplasty using computer-assisted navigation.   SURGEON: Laurice Record. Holley Bouche., MD  ASSISTANT: Vance Peper, PA  ANESTHESIA: Femoral nerve block and spinal.   ESTIMATED BLOOD LOSS: 50 mL.   FLUIDS REPLACED: 2000 mL of crystalloid.   TOURNIQUET TIME: 71 minutes.   DRAINS: Two medium drains to reinfusion system.   SOFT TISSUE RELEASES: Anterior cruciate ligament, posterior cruciate ligament, deep medial collateral ligament and patellofemoral ligament.   IMPLANTS UTILIZED: DePuy PFC Sigma size 4 posterior stabilized femoral component (cemented), size 4 MBT tibial component (cemented), 35 mm 3-peg oval dome patella (cemented), and a 10 mm stabilized rotating platform polyethylene insert.   INDICATIONS FOR SURGERY: The patient is a 81 year old male who has been seen for complaints of progressive right knee pain. X-rays demonstrated severe degenerative changes in a tricompartmental fashion with relative varus alignment. He had not seen any significant improvement despite conservative nonsurgical intervention. After discussion of the risks and benefits of surgical intervention, the patient expressed his understanding of the risks and benefits, and agreed with plans for surgical intervention.   PROCEDURE IN DETAIL: The patient was brought into the operating room, and after adequate femoral nerve block and spinal anesthesia was achieved, a tourniquet was placed on the patient's upper right thigh. The patient's right knee and leg were cleaned and prepped with alcohol and DuraPrep and draped in the usual sterile fashion. A "timeout" was performed as per usual protocol. The right lower extremity was exsanguinated using an  Esmarch, and the tourniquet was inflated to 300 mmHg. An anterior longitudinal incision was made, followed by a standard mid vastus approach. A moderate effusion was evacuated. The deep fibers of the medial collateral ligament were elevated in a subperiosteal fashion off the medial flare of the tibia so as to maintain a continuous soft tissue sleeve. The patella was subluxed laterally, and the patellofemoral ligament was incised. Inspection of the knee demonstrated severe degenerative changes with full thickness loss of articular cartilage to the patellofemoral articulation and to the medial compartment. Osteophytes were debrided using a rongeur. Anterior and posterior cruciate ligaments were excised. Two 4.0 mm Schanz pins were inserted into the femur and into the tibia for attachment of the array of trackers used for computer-assisted navigation. Hip center was identified using circumduction technique. Distal landmarks were mapped using the computer. The distal femur and proximal tibia were mapped using the computer. Distal femoral cutting guide was positioned using computer-assisted navigation so as to achieve a 5 degree distal valgus cut. Cut was performed and verified using the computer. Distal femur was sized, and it was felt that the size 4 femoral component was appropriate. A size 4 cutting guide was positioned, and the anterior cut was performed and verified using the computer. This was followed by completion of the posterior and chamfer cuts. Femoral cutting guide for a central box was then positioned, and the central box cut was performed.   Attention was then directed to the proximal tibia. Medial and lateral menisci were excised. The extramedullary tibial cutting guide was positioned using computer-assisted navigation so as to achieve 0 degree varus and valgus alignment and 0 degree posterior slope. Cut was performed and verified using the computer. The proximal tibia was sized, and it was  felt that a  size 4 tibial tray was appropriate. Tibial and femoral trials were inserted, followed by insertion of a 10 mm polyethylene trial. Excellent medial and lateral soft tissue balancing was appreciated both in full extension and in flexion. Finally, the patella was cut and prepared so as to accommodate a 35 mm 3-peg oval dome patella. Patellar trial was placed, and the knee was placed through a range of motion, with excellent patellar tracking appreciated.   The femoral trial was removed. Central posthole for the tibial component was reamed, followed by insertion of a keel punch. Tibial trials were then removed. The cut surfaces of bone were irrigated with copious amounts of normal saline with antibiotic solution using pulsatile lavage and then suctioned dry. Polymethylmethacrylate cement was prepared in the usual fashion using a vacuum mixer. Cement was applied to the cut surface of the proximal tibia as well as along the undersurface of a size 4 MBT tibial component. The tibial component was positioned and impacted into place. Excess cement was removed using [employer] fast streamer. Cement was then applied to the cut surface of the femur as well as along the posterior flanges of size 4 posterior stabilized femoral component. Femoral component was positioned and impacted into place. Excess cement was removed using [employer] fast streamer. A 10 mm polyethylene trial was inserted, and the knee was brought into full extension with steady axial compression applied. Finally, the cement was applied to the backside of a 35 mm 3-peg oval dome patella, and patellar component was positioned and patellar clamp applied. Excess cement was removed using [employer] fast streamer.   After adequate curing of the cement, the tourniquet was deflated after a total tourniquet time of 71 minutes. Hemostasis was achieved using electrocautery. The knee was irrigated with copious amounts of normal saline with antibiotic solution using a pulsatile lavage and then  suctioned dry. The knee was inspected for any residual cement debris. Then, 30 mL of 0.25% Marcaine with epinephrine was injected along the posterior capsule. A 10 mm stabilized rotating platform polyethylene insert was inserted, and then the knee was placed through range of motion. Excellent patellar tracking was appreciated. Excellent medial and lateral soft tissue balancing was noted. Two medium drains were placed in the wound bed and brought out through a separate stab incision to be attached to reinfusion system. The medial parapatellar portion of the incision was reapproximated using interrupted sutures of #1 Vicryl. The subcutaneous tissue was approximated in layers using first #0 Vicryl, followed by #2-0 Vicryl. The skin was closed with skin staples. A sterile dressing was applied.   The patient tolerated the procedure well. He was transported to the recovery room in stable condition.    ____________________________ Laurice Record. Holley Bouche., MD jph:OSi D: 07/17/2012 15:55:17 ET T: 07/18/2012 09:30:59 ET JOB#: [id number]  cc: Jeneen Rinks P. Holley Bouche., MD, <Dictator> Laurice Record Holley Bouche MD ELECTRONICALLY SIGNED 07/23/2012 15:13

## 2014-09-20 NOTE — Progress Notes (Signed)
CT surgery p.m. Rounds  Status post urgent CABG Sedated on ventilator Hemodynamic stable on low-dose inotropes Minimal chest tube drainage Postop labs reviewed Keep patient intubated overnight because of low PaO2

## 2014-09-20 NOTE — H&P (Signed)
PATIENT NAME:  Christopher [name], STOCKS MR#:  [id number] DATE OF BIRTH:  May 16, 1932  DATE OF ADMISSION:  07/25/2012  PRIMARY CARE PHYSICIAN:  Dr. Ola Spurr with [facility].    ORTHOPEDIC DOCTOR:  Dr. Marry Guan.   CHIEF COMPLAINT:  Yellowish skin, generalized weakness.   HISTORY OF PRESENT ILLNESS:  This is a 81 year old male with past medical history of coronary artery disease, 3 stents in the past and had right knee replacement surgery on February 17th and after that he was sent to rehab for 4 to 5 days and he was doing good so he was sent home today.  Since last 2 to 3 days in rehab he was gradually becoming more and more weak and he was losing his appetite and was becoming more sleepy.  Today as he was doing okay from his physical therapy point of view so sent home and wife noticed that his color is changed.  The patient also noticed that he has dark yellowish urine for the last 2 to 3 days.  He did not notice his stool color and that is why they were worried.  They called Dr. Marry Guan about this finding and Dr. Marry Guan told that it looks like that he has some urine infection and advised him to call his primary doctor or to go to Emergency Room.  He came to ER and on work-up found having elevated bilirubin and so being admitted for further work-up.   REVIEW OF SYSTEMS:  Negative for fever, but positive for fatigue and generalized weakness.  No weight loss or weight gain.  He denies any sick contacts or any travel.  EYES:  No blurring or double vision or redness on the eyes.  EARS, NOSE, THROAT:  No tinnitus, hearing loss or any discharge from the ears.  RESPIRATORY:  No cough, wheezing or any shortness of breath.  CARDIOVASCULAR:  No chest pain, orthopnea, arrhythmia.  GASTROINTESTINAL:  No nausea, vomiting, diarrhea or abdominal pain.  GENITOURINARY:  No dysuria, hematuria or increased frequency.  ENDOCRINOLOGY:  No polyuria, nocturia, heat or cold intolerance.  MUSCULOSKELETAL:  No pain or swelling in  the joints.  NEUROLOGICAL:  No numbness, weakness or dysarthria in the joints.  PSYCHIATRIC:  No anxiety, insomnia or bipolar disorder.    PAST MEDICAL HISTORY:  Is positive for cardiology, coronary artery disease, three stent placement a few years ago.  Dr. Rockey Situ is his cardiologist and he takes regular medication for that.   PAST SURGICAL HISTORY:  Is positive for left knee replacement two years ago and right knee replacement 10 days ago.   SOCIAL HISTORY:  He denies smoking.  No drinking.  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  No travel history.   FAMILY HISTORY:  Mother had heart problems.  He does not know what and mother died because of that.   HOME MEDICATIONS:  Atorvastatin 80 mg, amlodipine and benazepril 10 mg/20 mg once daily, clopidogrel 75 mg daily, omeprazole 20 mg daily, metoprolol 25 mg daily, aspirin 81 mg daily, fish oil 1000 mg daily and vitamin D.    PHYSICAL EXAMINATION:  VITAL SIGNS:  In ER, temperature 98, pulse rate 87, respirations 18, blood pressure 148/80 and pulse ox 97 on room air.  GENERAL:  Fully alert, oriented to time, place and person, no acute distress. HEAD AND NECK:  Atraumatic.  Conjunctivae pink.  Sclerae icteric.  Oral mucosa moist.  NECK:  Supple.  No JVD.  RESPIRATORY:  Bilateral clear  and equal air entry.  CARDIOVASCULAR:  S1, S2 present, regular.  No murmur.  ABDOMEN:  Soft, nontender.  No tenderness.  No hepatomegaly felt.  Bowel sounds normal.  SKIN:  Slightly yellowish tinge.  No rashes.  JOINTS:  No swelling or tenderness.  LEGS:  No edema.  Power 5 out of 5 all four limbs.  No tremors.  PSYCHIATRIC:  Does not appear in any acute psychiatric illness at this point of time.   LABORATORY RESULTS:  Glucose 179, BUN 25, creatinine 1.02, sodium 135, potassium 4.2, chloride 99 and CO2 24, calcium 8.8, LDH 201, amylase 23, which is low.  Lipase 269.  His total protein 6.9, albumin 3.1, bilirubin 9.7, alkaline  phosphatase 75, SGOT 108 and SGPT 169.  WBCs 6, hemoglobin 11.4, platelet count 225, MCV 89.  Urinalysis, 15 to 30 WBCs.  Sonogram of the right upper quadrant abdomen is cholelithiasis and gallbladder sludge without sonographic evidence of acute cholecystitis.   ASSESSMENT AND PLAN:   1.  Jaundice, elevated liver function tests, may be due to sludge in the gallbladder and gallstone.  No obstructive finding or cholecystitis found.  We will keep him on IV fluids, monitor his complete metabolic panel and get hepatitis panel.  GI consult for further work-up.  2.  Coronary artery disease.  Continue cardiac medication.  3.  Hyperlipidemia.  We will hold statin for now due to elevated liver function tests.  4.  Deep vein thrombosis prophylaxis.  Continue Lovenox.   TOTAL CARE TIME SPENT:  50 minutes.   CODE STATUS:  FULL CODE.     ____________________________ Ceasar Lund Anselm Jungling, MD vgv:ea D: 07/25/2012 23:14:29 ET T: 07/26/2012 00:43:23 ET JOB#: [id number]  cc: Ceasar Lund. Anselm Jungling, MD, <Dictator> Dr. Napoleon Form [facility] MD ELECTRONICALLY SIGNED 07/30/2012 23:08

## 2014-09-20 NOTE — Anesthesia Postprocedure Evaluation (Signed)
  Anesthesia Post-op Note  Patient: Christopher [name]  Procedure(s) Performed: Procedure(s): CORONARY ARTERY BYPASS GRAFTING (CABG) x   three using left internal mammary artery and right leg greater saphenous vein harvested endoscopically (N/A) TRANSESOPHAGEAL ECHOCARDIOGRAM (TEE) (N/A)  Patient Location: SICU  Anesthesia Type:General  Level of Consciousness: Patient remains intubated per anesthesia plan  Airway and Oxygen Therapy: Patient remains intubated per anesthesia plan and Patient placed on Ventilator (see vital sign flow sheet for setting)  Post-op Pain: none  Post-op Assessment: Post-op Vital signs reviewed, Patient's Cardiovascular Status Stable and Respiratory Function Stable  Post-op Vital Signs: Reviewed and stable  Last Vitals:  Filed Vitals:   09/20/14 0600  BP: 107/60  Pulse: 121  Temp:   Resp: 23    Complications: No apparent anesthesia complications

## 2014-09-20 NOTE — OR Nursing (Signed)
11:10 - 1st call to SICU charge nurse

## 2014-09-20 NOTE — Interval H&P Note (Signed)
History and Physical Interval Note:  09/20/2014 7:10 AM  Crist Infante  has presented today for surgery, with the diagnosis of CAD  The various methods of treatment have been discussed with the patient and family. After consideration of risks, benefits and other options for treatment, the patient has consented to  Procedure(s): CORONARY ARTERY BYPASS GRAFTING (CABG) (N/A) TRANSESOPHAGEAL ECHOCARDIOGRAM (TEE) (N/A) as a surgical intervention .  The patient's history has been reviewed, patient examined, no change in status, stable for surgery.  I have reviewed the patient's chart and labs.  Questions were answered to the patient's satisfaction.     Melrose Nakayama

## 2014-09-20 NOTE — Consult Note (Signed)
Brief Consult Note: Diagnosis: Right TKA.   Patient was seen by consultant.   Comments: Agree with continuing PT while in the hospital. Cannot have MRI while the skin staples are in place. Will need to make arrangements for home PT at discharge.  Electronic Signatures: Dereck Leep (MD)  (Signed 27-Feb-14 08:35)  Authored: Brief Consult Note   Last Updated: 27-Feb-14 08:35 by Dereck Leep (MD)

## 2014-09-20 NOTE — H&P (View-Only) (Signed)
Reason for Consult:Severe 3 vessel CAD s/p NSTEMI Referring Physician: Dr. Randa Lynn is an 81 y.o. male.  HPI: 81 yo man sent to [facility] for management of severe 3 vessel CAD after presenting with a cc/o chest pain.  Mr. Highley is an 81 yo man with a history of CAD with DES placed in LAD and RCA in 2005 for unstable angina.   He was in his usual state of health until 4 days ago when he noted chest pain while working in his yard. It was relieved with rest. He did well the following day, but on 4/18 had a sever episode while working to load a truck. This pain was much more severe and did not resolve right away with rest. He went to the ED at [facility]. His ECg was unremarkable and he ruled out for MI with troponins x 3.  Cardiac catheterization yesterday showed severe 3 vessel CAD with preserved LV function. He was transferred to [facility]. Last night he had atrial fibrillation with a rapid response and developed CP associated with that. This afternoon he had another episode of CP while walking.  He currently is pain free.  He was on Plavix prior to admission and last received it yesterday.  He has a known 40% RICA stenosis, which is asymptomatic.  Past Medical History  Diagnosis Date  . Coronary artery disease     a. PCI/DES to mLAD and dRCA in 2005, residual OM1 50%; b. nuclear stress test 02/2008: no ischemia; c. nuclear stress test 11/2009: mild ischemia in the inferior wall c/w previous stress test; d. cath 09/17/2014: critical ostial/prox LAD estimated at 90-95%, critical prox LCx, RCA w/ mild diff dz, EF >55%, no WMA  . Hyperlipidemia   . Hypertension   . Carotid artery stenosis     a. RICA 40%  . GERD (gastroesophageal reflux disease)   . PVD (peripheral vascular disease)     Past Surgical History  Procedure Laterality Date  . Cardiac catheterization    . Total knee arthroplasty      bilateral  . Cholecystectomy  2016    Family History  Problem Relation Age of Onset  . Heart  failure Mother     Social History:  reports that he has quit smoking. He has never used smokeless tobacco. He reports that he does not drink alcohol or use illicit drugs.  Allergies: No Known Allergies  Medications:  Scheduled: . aspirin EC  81 mg Oral Daily  . atorvastatin  20 mg Oral q1800  . benazepril  20 mg Oral Daily  . docusate sodium  100 mg Oral BID  . famotidine  20 mg Oral BID  . levobunolol  1 drop Both Eyes Daily  . metoprolol tartrate  12.5 mg Oral BID  . nitroGLYCERIN  0.5 inch Topical 4 times per day  . omega-3 acid ethyl esters  1 g Oral Daily    Results for orders placed or performed during the hospital encounter of 09/17/14 (from the past 48 hour(s))  MRSA PCR Screening     Status: None   Collection Time: 09/17/14  7:00 PM  Result Value Ref Range   MRSA by PCR NEGATIVE NEGATIVE    Comment:        The GeneXpert MRSA Assay (FDA approved for NASAL specimens only), is one component of a comprehensive MRSA colonization surveillance program. It is not intended to diagnose MRSA infection nor to guide or monitor treatment for MRSA infections.   Comprehensive  metabolic panel     Status: Abnormal   Collection Time: 09/18/14  6:15 AM  Result Value Ref Range   Sodium 136 135 - 145 mmol/L   Potassium 4.1 3.5 - 5.1 mmol/L   Chloride 104 96 - 112 mmol/L   CO2 24 19 - 32 mmol/L   Glucose, Bld 179 (H) 70 - 99 mg/dL   BUN 15 6 - 23 mg/dL   Creatinine, Ser 0.96 0.50 - 1.35 mg/dL   Calcium 8.6 8.4 - 10.5 mg/dL   Total Protein 6.0 6.0 - 8.3 g/dL   Albumin 3.6 3.5 - 5.2 g/dL   AST 24 0 - 37 U/L   ALT 22 0 - 53 U/L   Alkaline Phosphatase 60 39 - 117 U/L   Total Bilirubin 1.2 0.3 - 1.2 mg/dL   GFR calc non Af Amer 76 (L) >90 mL/min   GFR calc Af Amer 88 (L) >90 mL/min    Comment: (NOTE) The eGFR has been calculated using the CKD EPI equation. This calculation has not been validated in all clinical situations. eGFR's persistently <90 mL/min signify possible Chronic  Kidney Disease.    Anion gap 8 5 - 15  Magnesium     Status: None   Collection Time: 09/18/14  6:15 AM  Result Value Ref Range   Magnesium 1.9 1.5 - 2.5 mg/dL  TSH     Status: Abnormal   Collection Time: 09/18/14  6:15 AM  Result Value Ref Range   TSH 0.046 (L) 0.350 - 4.500 uIU/mL  Brain natriuretic peptide     Status: Abnormal   Collection Time: 09/18/14  6:15 AM  Result Value Ref Range   B Natriuretic Peptide 218.1 (H) 0.0 - 100.0 pg/mL  CBC WITH DIFFERENTIAL     Status: Abnormal   Collection Time: 09/18/14  6:15 AM  Result Value Ref Range   WBC 9.9 4.0 - 10.5 K/uL   RBC 4.28 4.22 - 5.81 MIL/uL   Hemoglobin 13.0 13.0 - 17.0 g/dL   HCT 37.6 (L) 39.0 - 52.0 %   MCV 87.9 78.0 - 100.0 fL   MCH 30.4 26.0 - 34.0 pg   MCHC 34.6 30.0 - 36.0 g/dL   RDW 14.9 11.5 - 15.5 %   Platelets 146 (L) 150 - 400 K/uL   Neutrophils Relative % 75 43 - 77 %   Neutro Abs 7.4 1.7 - 7.7 K/uL   Lymphocytes Relative 15 12 - 46 %   Lymphs Abs 1.5 0.7 - 4.0 K/uL   Monocytes Relative 9 3 - 12 %   Monocytes Absolute 0.9 0.1 - 1.0 K/uL   Eosinophils Relative 1 0 - 5 %   Eosinophils Absolute 0.1 0.0 - 0.7 K/uL   Basophils Relative 0 0 - 1 %   Basophils Absolute 0.0 0.0 - 0.1 K/uL  Protime-INR     Status: Abnormal   Collection Time: 09/18/14  6:15 AM  Result Value Ref Range   Prothrombin Time 16.5 (H) 11.6 - 15.2 seconds   INR 1.32 0.00 - 1.49  APTT     Status: Abnormal   Collection Time: 09/18/14  6:15 AM  Result Value Ref Range   aPTT 76 (H) 24 - 37 seconds    Comment:        IF BASELINE aPTT IS ELEVATED, SUGGEST PATIENT RISK ASSESSMENT BE USED TO DETERMINE APPROPRIATE ANTICOAGULANT THERAPY.   Heparin level (unfractionated)     Status: Abnormal   Collection Time: 09/18/14  6:15 AM  Result Value Ref Range   Heparin Unfractionated 0.22 (L) 0.30 - 0.70 IU/mL    Comment:        IF HEPARIN RESULTS ARE BELOW EXPECTED VALUES, AND PATIENT DOSAGE HAS BEEN CONFIRMED, SUGGEST FOLLOW UP TESTING OF  ANTITHROMBIN III LEVELS.   Platelet inhibition p2y12     Status: None   Collection Time: 09/18/14 11:16 AM  Result Value Ref Range   Platelet Function  P2Y12 242 194 - 418 PRU    Comment:        The literature has shown a direct correlation of PRU values over 230 with higher risks of thrombotic events.  Lower PRU values are associated with platelet inhibition.   Heparin level (unfractionated)     Status: None   Collection Time: 09/18/14  2:35 PM  Result Value Ref Range   Heparin Unfractionated 0.40 0.30 - 0.70 IU/mL    Comment:        IF HEPARIN RESULTS ARE BELOW EXPECTED VALUES, AND PATIENT DOSAGE HAS BEEN CONFIRMED, SUGGEST FOLLOW UP TESTING OF ANTITHROMBIN III LEVELS.     No results found.  Review of Systems  Constitutional: Negative for fever, chills and malaise/fatigue.  Respiratory: Negative for cough and shortness of breath.   Cardiovascular: Positive for chest pain and palpitations. Negative for leg swelling.  Genitourinary: Positive for frequency.  Musculoskeletal:       Bilateral total knee, no pain currently  Neurological: Negative.  Negative for weakness.  Endo/Heme/Allergies: Does not bruise/bleed easily.  All other systems reviewed and are negative.  Blood pressure 106/61, pulse 76, temperature 98.2 F (36.8 C), temperature source Oral, resp. rate 14, height _0  (1.803 m), weight 207 lb 7.3 oz (94.1 kg), SpO2 94 %. Physical Exam  Vitals reviewed. Constitutional: He is oriented to person, place, and time. He appears well-developed and well-nourished. No distress.  HENT:  Head: Normocephalic and atraumatic.  Eyes: EOM are normal. Pupils are equal, round, and reactive to light.  Neck: Neck supple. No thyromegaly present.  Cardiovascular: Normal rate, regular rhythm, normal heart sounds and intact distal pulses.  Exam reveals no gallop.   No murmur heard. Respiratory: Effort normal and breath sounds normal.  GI: Soft. There is no tenderness.   Musculoskeletal: He exhibits no edema.  Well healed scars both knees  Lymphadenopathy:    He has no cervical adenopathy.  Neurological: He is alert and oriented to person, place, and time. No cranial nerve deficit.  No focal deficit  Skin: Skin is warm and dry.   Cardiac catheterization films reviewed. They show severe 3 vessel CAD  Assessment/Plan: 81 yo man who presents with an unstable coronary syndrome and has been found to have severe 3 vessel CAD with preserved LV function.  CABG is indicated for survival benefit and relief of symptoms.  I discussed the general nature of the procedure, the need for general anesthesia, and the incisions to be used with Mr Covin and his family. I discussed the expected hospital stay, overall recovery and short and long term outcomes. I reviewed the indications, risks, benefits and alternatives. They understand the risks include, but are not limited to death, stroke, MI, DVT/PE, bleeding, possible need for transfusion, infections, cardiac arrhythmias, and other organ system dysfunction including respiratory, renal, or GI complications.   He accepts the risks and agrees to proceed.  Melrose Nakayama 09/18/2014, 5:43 PM     Addendum  P2Y12 = 242- I think we can proceed with CABG on Friday with only a  modestly increased risk of bleeding complications.  [name] [name], MD [facility] [phone]

## 2014-09-20 NOTE — Consult Note (Signed)
   General Aspect Christopher [name] is a very pleasant 81 year old gentleman with a history of coronary artery disease, Taxus stent placed to his distal RCA and mid LAD in 2005. There was residual 50% obtuse marginal disease with repeat stress test in October 2009 showing no ischemia .  Cardiac catheterization in  September 2005 showed 90% mid LAD, 80% distal RCA, 50% OM1 at the mid vessel, Taxus stent 2.75 x 24 mm stent to his LAD, TAxus stent 2.5 x 16 mm stent to the RCA. He presented for elective right knee replacement. He has been off Aspirin and Plavix for 1 week. After the surgery, he complained of significant substernal chest pain "aching". It was partially improved with NTG. ECG showed no acute changes.   Physical Exam:  GEN no acute distress   NECK supple   RESP normal resp effort  clear BS   CARD Regular rate and rhythm  No murmur   ABD denies tenderness   LYMPH negative neck, negative axillae   EXTR negative cyanosis/clubbing, negative edema   PSYCH alert, A+O to time, place, person   Review of Systems:  Subjective/Chief Complaint chest pain after knee repalcement.   General: No Complaints   Skin: No Complaints   ENT: No Complaints   Neck: No Complaints   Respiratory: No Complaints   Cardiovascular: Chest pain or discomfort   Gastrointestinal: No Complaints   Genitourinary: No Complaints   Vascular: No Complaints   Musculoskeletal: No Complaints   Neurologic: No Complaints   Hematologic: No Complaints   Endocrine: No Complaints   Psychiatric: No Complaints   EKG:  EKG Nml  sinus bradycardia   EKG Comparision Not changed from    No Known Allergies:    Impression 1. Chest pain post knee repalcement: known history of CAD. ECG does not show any ischemic changes. Will check labs and cardiac enzymes X 3. Apply telemtry. Repeat ECG in AM. Aspirin 81 mg was given. Further work up depending on course.   2. sinus bradycardia: Hold Metoprolol.   3.  Hypertension: seems controlled.   Electronic Signatures: Kathlyn Sacramento (MD)  (Signed 17-Feb-14 17:05)  Authored: General Aspect/Present Illness, History and Physical Exam, Review of System, EKG , Allergies, Impression/Plan   Last Updated: 17-Feb-14 17:05 by Kathlyn Sacramento (MD)

## 2014-09-20 NOTE — Progress Notes (Signed)
  Echocardiogram Echocardiogram Transesophageal has been performed.  Darlina Sicilian M 09/20/2014, 9:47 AM

## 2014-09-20 NOTE — Discharge Summary (Signed)
PATIENT NAME:  Christopher [name], Christopher [name] MR#:  [id number] DATE OF BIRTH:  [birth year]  DATE OF ADMISSION:  07/25/2012 DATE OF DISCHARGE:  07/27/2012  PRIMARY CARE PHYSICIAN: Dr. Adrian Prows  CONSULTING GASTROENTEROLOGIST: Dr. Verta Ellen  ORTHOPEDIST: Dr. Skip Estimable   DISCHARGE DIAGNOSES: 1.  Cholestatic hepatitis, unclear etiology.  2.  Coronary artery disease.  3.  Hypertension.  4.  Status post total knee replacement.   HISTORY OF PRESENT ILLNESS: Please see admission history and physical. Briefly, this is a pleasant healthy 81 year old gentleman who underwent total knee replacement and was discharged in good shape to rehab. He came home the day of admission and developed nausea, poor appetite, profound fatigue and also jaundice. He was brought into the hospital where he was found to have a bilirubin of 9.0.  The patient was admitted for further evaluation.   HOSPITAL COURSE BY ISSUE:  1.  Acute cholestatic hepatitis: He has painless jaundice which can be a concern. He was seen by GI. Ultrasound showed sludge and some stones but no obstruction. It was felt that this was either drug-induced or related to possibly the sludge. His LFTs did improve with T-bili down to 4.0. The patient requested discharge with outpatient follow-up. As he is tolerating diet, has no fevers, abdominal pain or other symptoms this was agreeable. He will follow up on Monday with repeat liver tests as well as CBC and see Dr. Vira Agar. He will likely need an outpatient MRI, MRCP, or a CT scan to further evaluate. At the time of hospital discharge, hepatitis serologies were pending. 2.  Coronary artery disease. He had no chest pain or shortness of breath with this. He was continued on his aspirin and Plavix.  3.  Hypertension: We restarted his metoprolol, Norvasc and ACE inhibitor, bearing in mind, however, that these may contribute to cholestatic hepatitis. It was felt less likely this was the cause.  4.  Total knee replacement.  He was seen by Dr. Marry Guan. He will be discharged with outpatient physical therapy at [facility].   DISCHARGE MEDICATIONS: 1.  Plavix 75 once a day.  2.  Vitamin D3 once a day.  3.  Toprol 25 once a day.  4.  Omeprazole 20 mg once a day.  5.  Oxycodone 5 q. 4 hours.  6.  Tramadol 50 q. 4 hours.  7.  Magnesium hydroxide p.r.n. constipation.  8.  Bisacodyl p.r.n. constipation.  9.  Aspirin 81 mg once a day.  10. Amlodipine/benazepril 10/20 once a day.  11. Zofran 4 mg p.r.n. nausea q. 8 hours.   DISCHARGE INSTRUCTIONS AND FOLLOWUP:   1.  The patient will be discharged home but may need Home Health, though he will likely just get outpatient physical therapy.  2.  Diet: Low fat, low sodium, low cholesterol. Regular consistency but bland diet.  3.  The patient will follow up with Dr. Vira Agar on Monday for LFTs and evaluation.  4.  The patient will follow up with Dr. Ola Spurr in 1 to 2 weeks.  5.  He will follow up with Dr. Marry Guan as scheduled.   TIME SPENT: This discharge took 35 minutes.  ____________________________ Cheral Marker. Ola Spurr, MD dpf:cb D: 07/27/2012 13:16:59 ET T: 07/27/2012 14:33:06 ET JOB#: [id number]  cc: Cheral Marker. Ola Spurr, MD, <Dictator> DAVID Ola Spurr MD ELECTRONICALLY SIGNED 08/02/2012 19:26

## 2014-09-20 NOTE — Anesthesia Procedure Notes (Addendum)
Procedure Name: Intubation Date/Time: 09/20/2014 7:37 AM Performed by: Gaylene Brooks Pre-anesthesia Checklist: Emergency Drugs available, Patient identified, Timeout performed, Suction available and Patient being monitored Patient Re-evaluated:Patient Re-evaluated prior to inductionOxygen Delivery Method: Circle system utilized Preoxygenation: Pre-oxygenation with 100% oxygen Intubation Type: IV induction Ventilation: Mask ventilation without difficulty and Oral airway inserted - appropriate to patient size Laryngoscope Size: Sabra Heck and 2 Grade View: Grade I Tube type: Oral Tube size: 8.0 mm Number of attempts: 1 Airway Equipment and Method: Stylet Placement Confirmation: ETT inserted through vocal cords under direct vision,  breath sounds checked- equal and bilateral,  positive ETCO2 and CO2 detector Secured at: 23 cm Tube secured with: Tape Dental Injury: Teeth and Oropharynx as per pre-operative assessment

## 2014-09-20 NOTE — Discharge Summary (Signed)
PATIENT NAME:  Christopher [name], Christopher [name] MR#:  [id number] DATE OF BIRTH:  06-05-1931  DATE OF ADMISSION:  07/17/2012 DATE OF DISCHARGE: 07/20/2012    DICTATING FOR: Jeneen Rinks P. Holley Bouche., MD  ADMITTING DIAGNOSIS: Degenerative arthrosis of right knee.   DISCHARGE DIAGNOSES:  1. Degenerative arthrosis of right knee.  2. Substernal chest pain   CONSULTATION: Dr. Rogue Jury Arida/Dr. Alverda Skeans, cardiologist.   HISTORY: The patient is a pleasant 81 year old who has been followed at [facility] for progression of right knee pain. The patient had localized most of the pain along the medial aspect of the knee. His pain was aggravated with walking as well as weight-bearing activities. His right knee pain had increased to the point that it was significantly interfering with his activities of daily living. The patient had noted some locking as well as giving-way of the knee. He had also reported some swelling. His activities have been limited because of the swelling. His x-rays taken at [facility] showed narrowing of the medial cartilage space with associated varus alignment. Osteophyte as well as subchondral sclerosis were noted. After discussion of the risks and benefits of surgical intervention, the patient expressed his understanding of the risks and benefits and agreed for plans for surgical intervention.   HOSPITAL COURSE:  PROCEDURE: Right total knee arthroplasty using computer-assisted navigation.   ANESTHESIA: Femoral nerve block with spinal.   SOFT TISSUE RELEASE: Anterior cruciate ligament, posterior cruciate ligament, deep medial collateral ligaments as well as the patellofemoral ligament.   IMPLANTS UTILIZED: DePuy PFC Sigma size 4 posterior stabilized femoral component (cemented), size 4 MBT tibial component (cemented), 35 mm 3-pegged oval dome patella (cemented), and a 10 mm stabilized rotating platform polyethylene insert.   HOSPITAL COURSE: The patient tolerated the  procedure very well. He had no complications. He was then taken to the PACU. While in PACU, the patient did begin having substernal chest pain. Subsequently, a cardiology consult was obtained. The patient does have a history of coronary artery disease and has a stent implant. EKGs and enzymes were obtained, and the patient was given 1 sublingual nitroglycerin. This seemed to ease his condition off. His tests have been unremarkable and were unchanged from his preoperative studies. He was noted be in slight sinus bradycardia, and subsequently, metoprolol was held. His blood pressures and everything were fine. His substernal chest pain subsided, and he has had no other chest pains or complaints as well as shortness of breath throughout the hospital course. He was then taken to 1A Orthopedic Department, where he began receiving therapy.   The patient was fitted with TED stockings bilaterally. These were allowed to be removed 1 hour per 8-hour shift. The right one was applied on day 2 following removal of the Hemovac and dressing change. The patient was also fitted with AVI compression foot pumps bilaterally set at 80 mmHg. His calves have been nontender. There has been no evidence of any DVTs. Heels were elevated off the bed using rolled towels.   The patient has denied any chest pains after the PACU incident. He has had no shortness of breath. The patient has done very well. Vital signs have been stable. He has been afebrile. Hemodynamically, he is stable. No transfusions were given other than the Autovac transfusion given the first 6 hours postoperatively. The patient did begin receiving physical therapy on day 1 following surgery. He has done very well. Range of motion was greater than 90 degrees. He was ambulating well. Occupational therapy was also  initiated on day 1 for ADLs and assistive devices.   The patient's IV, Foley and Hemovac were discontinued on day 1, followed by the dressing change. His wound was  free of any drainage or signs of infection. The Polar Care was reapplied to the surgical leg, maintaining a temperature of 40 to 50 degrees Fahrenheit.   DISCHARGE INSTRUCTIONS:  1. The patient is being discharged to a skilled nursing facility in improved stable condition.  2. He may weight bear as tolerated. Continue using a rolling walker until cleared by physical therapy to go to a quad cane.  3. He is to continue wearing the TED stockings. These are to be worn around the clock, but may be removed 1 hour per 8-hour shift.  4. Continue Polar Care, maintaining a temperature of 40 to 50 degrees Fahrenheit.  5. Elevate heels off the bed using rolled towels.  6. He is placed on a regular diet.  7. He is to continue using incentive spirometer q.1 hour while awake.  8. Encourage cough, deep breathing q.2 hours while awake.  9. He has a followup appointment in the clinic on March 4th. He is to call the clinic sooner if any complications.   10. Change dressing as needed.  11. Staples will be removed in the office in 2 weeks.   DRUG ALLERGIES: No known drug allergies.   MEDICATIONS:  1. Lotrel 5/10 two capsules daily.  2. Atorvastatin 80 mg daily. 3. Celebrex 200 mg twice a day.  4. Vitamin D3 1000 units daily.  5. Senokot-S 1 tablet b.i.d.  6. Metoprolol 25 mg q.a.m.  7. Omega-3 fatty acid 1 gram daily.  8. Prilosec 20 mg at 6:00 a.m.  9. Aspirin 81 mg daily.  10. Lovenox 30 mg subcutaneous q.12 hours for 14 days, then discontinue, and begin taking his Plavix 75 mg daily.  11. Tylenol ES 500 to 1000 mg q.4 hours p.r.n. for temperatures of greater than 100.4. 12. Mylanta DS 30 mL q.6 hours p.r.n.  13. Dulcolax suppositories 10 mg rectally p.r.n. for constipation.  14. Milk of Magnesia 30 mL b.i.d. p.r.n.  15. Roxicodone 5 to 10 mg q.4-6 hours p.r.n.  16. Tramadol 50 to 100 mg q.4-6 hours p.r.n.   PAST MEDICAL HISTORY: Chickenpox, coronary artery disease, gastroesophageal reflux disease,  benign prostate hypertrophy.    ____________________________ Vance Peper, PA jrw:OSi D: 07/20/2012 07:24:20 ET T: 07/20/2012 07:48:44 ET JOB#: [id number]  cc: Vance Peper, PA, <Dictator> JON WOLFE PA ELECTRONICALLY SIGNED 07/23/2012 18:24

## 2014-09-20 NOTE — Progress Notes (Addendum)
INITIAL NUTRITION ASSESSMENT  DOCUMENTATION CODES Per approved criteria  -Not Applicable   INTERVENTION:  If TF started, recommend Vital AF 1.2 formula -- initiate at 20 ml/hr and increase by 10 ml every 4 hours to goal rate of 70 ml/hr to provide 2016 kcals, 126 gm protein, 1362 ml of free water RD to follow for nutrition care plan  NUTRITION DIAGNOSIS: Inadequate oral intake related to inability to eat as evidenced by NPO status  Goal: Initiation of nutrition support in next 24-48 hours if prolonged intubation expected  Monitor:  TF initiation & tolerance, respiratory status, weight, labs, I/O's  Reason for Assessment: Malnutrition Screening Tool Report, VDRF  81 y.o. male  Admitting Dx: NSTEMI (non-ST elevated myocardial infarction)  ASSESSMENT: Patient with PMH of CAD, HTN, GERD; presented for surgical intervention with diagnosis of CAD.  Patient s/p procedure 4/22: CORONARY ARTERY BYPASS GRAFTING (CABG) x 3   Patient is currently intubated on ventilator support -- OGT in place MV: 7.3 L/min Temp (24hrs), Avg:98.5 F (36.9 C), Min:97.5 F (36.4 C), Max:99 F (37.2 C)   Per Malnutrition Screening Tool Report, pt has been eating poorly because of a decreased appetite and has recently lost wt.  Per wt readings, pt has had a 4% wt loss since January 2016 -- not significant for time frame.  No muscle or subcutaneous fat depletion noticed.  Height: Ht Readings from Last 1 Encounters:  09/17/14 5\' 11"  (1.803 m)    Weight: Wt Readings from Last 1 Encounters:  09/20/14 209 lb 3.5 oz (94.9 kg)    Ideal Body Weight: 172 lb  % Ideal Body Weight: 121%  Wt Readings from Last 10 Encounters:  09/20/14 209 lb 3.5 oz (94.9 kg)  06/12/14 219 lb 8 oz (99.565 kg)  02/22/14 218 lb 4 oz (98.998 kg)  11/12/13 219 lb 8 oz (99.565 kg)  09/26/13 217 lb (98.431 kg)  08/29/13 218 lb (98.884 kg)  08/02/13 217 lb (98.431 kg)  07/05/13 214 lb (97.07 kg)  05/11/13 219 lb 8 oz  (99.565 kg)  03/19/13 219 lb (99.338 kg)    Usual Body Weight: 219 lb  % Usual Body Weight: 95%  BMI:  Body mass index is 29.19 kg/(m^2).  Estimated Nutritional Needs: Kcal: 1850-2050 Protein: 115-125 gm Fluid: 1.8-2.0 L  Skin: surgical chest incision   Diet Order: NPO  EDUCATION NEEDS: -No education needs identified at this time   Intake/Output Summary (Last 24 hours) at 09/20/14 1425 Last data filed at 09/20/14 1300  Gross per 24 hour  Intake 1314.76 ml  Output   1770 ml  Net -455.24 ml    Labs:   Recent Labs Lab 09/18/14 0615 09/20/14 1226  NA 136 134*  K 4.1 4.0  CL 104  --   CO2 24  --   BUN 15  --   CREATININE 0.96  --   CALCIUM 8.6  --   MG 1.9  --   GLUCOSE 179* 218*    Scheduled Meds: . [START ON 09/21/2014] acetaminophen  1,000 mg Oral 4 times per day   Or  . [START ON 09/21/2014] acetaminophen (TYLENOL) oral liquid 160 mg/5 mL  1,000 mg Per Tube 4 times per day  . [START ON 09/21/2014] aspirin EC  325 mg Oral Daily   Or  . [START ON 09/21/2014] aspirin  324 mg Per Tube Daily  . atorvastatin  20 mg Oral q1800  . [START ON 09/21/2014] bisacodyl  10 mg Oral Daily   Or  . [  START ON 09/21/2014] bisacodyl  10 mg Rectal Daily  . cefUROXime (ZINACEF)  IV  1.5 g Intravenous Q12H  . chlorhexidine  15 mL Mouth/Throat Once  . [START ON 09/21/2014] docusate sodium  200 mg Oral Daily  . famotidine (PEPCID) IV  20 mg Intravenous Q12H  . insulin regular  0-10 Units Intravenous TID WC  . levobunolol  1 drop Both Eyes Daily  . magnesium sulfate  4 g Intravenous Once  . metoprolol tartrate  12.5 mg Oral BID   Or  . metoprolol tartrate  12.5 mg Per Tube BID  . [START ON 09/22/2014] pantoprazole  40 mg Oral Daily  . potassium chloride  10 mEq Intravenous Q1 Hr x 3  . [START ON 09/21/2014] sodium chloride  3 mL Intravenous Q12H  . vancomycin  1,000 mg Intravenous Once    Continuous Infusions: . sodium chloride 20 mL/hr at 09/20/14 1215  . [START ON 09/21/2014]  sodium chloride    . sodium chloride 20 mL/hr at 09/20/14 1215  . dexmedetomidine 0.7 mcg/kg/hr (09/20/14 1215)  . DOPamine 5 mcg/kg/min (09/20/14 1215)  . insulin (NOVOLIN-R) infusion 8.2 Units/hr (09/20/14 1339)  . lactated ringers    . lactated ringers 20 mL/hr at 09/20/14 1329  . nitroGLYCERIN    . norepinephrine (LEVOPHED) Adult infusion    . phenylephrine (NEO-SYNEPHRINE) Adult infusion      Past Medical History  Diagnosis Date  . Coronary artery disease     a. PCI/DES to mLAD and dRCA in 2005, residual OM1 50%; b. nuclear stress test 02/2008: no ischemia; c. nuclear stress test 11/2009: mild ischemia in the inferior wall c/w previous stress test; d. cath 09/17/2014: critical ostial/prox LAD estimated at 90-95%, critical prox LCx, RCA w/ mild diff dz, EF >55%, no WMA  . Hyperlipidemia   . Hypertension   . Carotid artery stenosis     a. RICA 40%  . GERD (gastroesophageal reflux disease)   . PVD (peripheral vascular disease)     Past Surgical History  Procedure Laterality Date  . Cardiac catheterization    . Total knee arthroplasty      bilateral  . Cholecystectomy  2016    Arthur Holms, RD, LDN Pager #: [phone] After-Hours Pager #: [phone]

## 2014-09-20 NOTE — Progress Notes (Signed)
Pt woke up disoriented, complaining of not feeling right, chest burning. O2 sats down to 80s on 2L, having bursts of a fib again.  EKG performed showing accelerated junctional with previously documented marked ST abnormality.  Dr. Philbert Riser notified, orders received for [facility] and gi cocktail.  Pt placed on nonrebreather with sats now 90s, nitro gtt turned on at 19mcg.  Pt now beginning to rest comfortably.  Vista Lawman, RN

## 2014-09-20 NOTE — Consult Note (Signed)
Pt is 81 year old white male with painless jaundice.  He does have stones and sludge on [place].  Even though he has no dilated ducts he could have CBD stone causing this.  He may have passed it as the TB is trending down and he says his urine is getting lighter.  Painless jaundice also makes worried about small pancreatic cancer early on which may cause jaundice before it causes pain.  I talked to radiology and although they prefer to wait 5-6 weeks after total joint replacement it is not absolutely contraindicated.  I also asked Dr. Marry Guan who did his surgery. Sometimes antibiotics can cause this type of painless jaundice, even one dose if pt was previously sensitized.  Recommend follow LFT's for a day or two and if not falling rapidly would get MRCP/MRI.   Electronic Signatures: Manya Silvas (MD)  (Signed on 26-Feb-14 18:44)  Authored  Last Updated: 26-Feb-14 18:44 by Manya Silvas (MD)

## 2014-09-20 NOTE — Consult Note (Signed)
PATIENT NAME:  Christopher [name], Christopher [name] MR#:  [id number] DATE OF BIRTH:  Apr 17, 1932  DATE OF CONSULTATION:  07/26/2012  REFERRING PHYSICIAN:  Dr. Ola Spurr  CONSULTING PHYSICIAN:  Corky Sox. Meredeth Ide, and Gaylyn Cheers, MD   REASON FOR CONSULTATION:  Jaundice.    HISTORY OF PRESENT ILLNESS:  The patient is seen in consultation at the time  request of Dr. Ola Spurr for evaluation of painless jaundice.  The patient states that for the past several days he has noticed increasing fatigue and his family members have noticed that his skin has begun turning quite yellow.  When labs were checked, he was found to have an elevated bilirubin and he was advised to be evaluated at the hospital for further workup.  We were then asked to come in for consultation.  Yesterday, the patient's total bilirubin was 9.7, today it has declined down to 7.5.  He also was found to have an elevated alk phos of 275 with transaminitis of ALT 169 and AST of 108.  The patient denies having any abdominal pain.  There has been no nausea or vomiting.  He suspects he may have had a fever over the weekend as he did wake up in a sweat, however, he was not able to take his temperature.  He denies any fever or chills since that time.  Upon further workup, an ultrasound of the abdomen was obtained on the patient and it was notable for sludge and gallstones, however, it was negative for biliary dilation or pericholecystic fluid to suggest cholecystitis.  There was some degree of questionable fatty infiltration as well.  The patient denies any new medications.  He denies any alcohol use.  He does still have a gallbladder.  It is important to note that he is only approximately 10 days postop from a right knee replacement.  He denies being placed on antibiotics following the exam but is unsure if he was given any antibiotics perioperatively.  He did not have an elevated white blood cell count upon admission.  He does state that he noticed his urine was  cola-colored over the weekend and up until yesterday, however, today he states that it is starting to return to a normal color.  He denies any changes in bowel habits.  He denies any changes in color, caliber or frequency of his stool.  No diarrhea, constipation, bright red blood per rectum or melena.  Currently, he has no symptomatic complaints outside of his recent knee surgery and his painless jaundice.  No chest pain or shortness of breath.  No current fever or chills.  No unintentional weight changes.    HOME MEDICATIONS:  Atorvastatin, amlodipine, benazepril, clopidogrel, omeprazole, metoprolol, aspirin, fish oil and vitamin D.    INPATIENT MEDICATIONS:  Pantoprazole, odansetron, baby aspirin, clopidogrel, Lovenox and IV fluids.    PAST MEDICAL HISTORY:  Coronary artery disease, status post stent placement x 3, also history of esophageal reflux disease, degenerative joint disease.  Of note, the patient's cardiologist is Dr. Rockey Situ.    PAST SURGICAL HISTORY:  He has had bilateral knee replacements, 1 in his left knee being approximately 2 years ago.  His right knee was just replaced 10 days ago.    SOCIAL HISTORY:  The patient denies any alcohol, tobacco or illicit drug use.  No recent travel.  FAMILY HISTORY:  There is no known family history of GI malignancy or IBD.  There is no known family history of liver disease or hepatitis or cirrhosis.  REVIEW OF SYSTEMS:  A 12-system review of systems was obtained on the patient.  All pertinent positives are mentioned above and otherwise negative.    OBJECTIVE: VITAL SIGNS:  Blood pressure 150/79, pulse 82, respirations 18, temp 98.3, bedside pulse ox 97%.   GENERAL:  This is a pleasant 81 year old gentleman accompanied by several family members at bedside, alert and oriented x 3, in no acute distress, resting quietly and comfortably in bed.   HEAD:  Atraumatic, normocephalic.   NECK:  Supple, no lymphadenopathy noted. HEENT:  He does have  icteric sclerae.  Mucous membranes moist.   SKIN:  Mildly jaundiced. PULMONARY:  Respirations are even and unlabored, clear to auscultation in bilateral anterior lung fields.   CARDIAC:  Regular rate and rhythm, S1 and S2 noted.  ABDOMEN:  Soft, nontender, nondistended, normoactive bowel sounds noted in all 4 quadrants.  No guarding or rebound.  Negative Murphy's.  No organomegaly was palpated, however, exam was limited secondary to body habitus.   EXTREMITIES:  Negative for lower extremity edema.  He has a knee brace on his right knee from recent surgery.   NEURO:  Appropriate mood and affect.  cranial nerves II-XII grossly intact.  LABORATORY DATA:  White blood cells 4.8, hemoglobin 10.9, hematocrit 30.1, platelets 207.  Sodium 135, potassium 4.2, BUN 25, creatinine 1.02, glucose 179.  Bilirubin was 9.7 and has come down to 7.5 today, alk phos 272, ALT 129, AST 90.    IMAGING:  An ultrasound of the abdomen was obtained on the patient and this was positive for gallbladder sludge as well as cholelithiasis.  This was negative for biliary ductal dilation, negative for pericholecystic fluid to suggest cholecystitis.  There was possible fatty infiltration of the liver.    ASSESSMENT:   1.  Elevated LFTs with clinical jaundice.   2.  Fatigue.   3.  Recent right knee replacement, approximately 10 days postop.   4.  History of coronary artery disease, currently on aspirin and Plavix, status post stent placement x 3.    PLAN:  I discussed this patient's case in detail with Dr. Gaylyn Cheers, who was involved in the development of the patient's plan of care.  At this time, we did review the patient's laboratory work as well as his ultrasound and his elevated LFTs doe seem to be trending down, however, we do recommend checking a liver panel every day while he is inpatient to monitor the trend.  We do also recommend obtaining a hepatitis panel (A, B and C), which it appears has been ordered already and,  therefore, we will await these findings.  Ultrasound was reviewed and was notable for sludge and stones.  Therefore, we do feel the patient can benefit from an MRCP for further evaluation and to rule out obstruction as the source of jaundice.  He is only 10 days postop from a right knee replacement and, therefore, we will discuss this case with radiology to see if this exam will be appropriate in this patient's case.  Pending above findings, if an ERCP is warrented down the line, we will certainly have to address the fact that he is currently on Plavix and aspirin.  In the meantime, we do recommend keeping a close eye on the patient's vital signs and should he spike a fever we do recommend obtaining blood cultures for possible consideration of initiating antibiotics.   This was discussed in detail with the patient and the patient's family, who verbalized understanding and are in  agreement.  All questions were answered.  Further recommendations pending above and per clinical course.    The above plan of care was discussed and agreed upon under supervisoratory agreement between myself and Dr. Gaylyn Cheers, who will continue to follow this patient during hospitalization.    Thank you so much for the consultation.    ATTENDING PHYSICIAN:  Dr. Gaylyn Cheers.      ____________________________ Corky Sox. Eesha Schmaltz, PA-C kme:cs D: 07/26/2012 14:11:23 ET T: 07/26/2012 14:28:57 ET JOB#: [id number]  cc: Corky Sox. Alvaro Aungst, PA-C, <Dictator> [place] PA ELECTRONICALLY SIGNED 07/27/2012 15:58

## 2014-09-20 NOTE — Brief Op Note (Addendum)
09/17/2014 - 09/20/2014      [address]       [place] [address]             [phone]     09/17/2014 - 09/20/2014  10:12 AM  PATIENT:  Christopher [name]  81 y.o. male  PRE-OPERATIVE DIAGNOSIS:  CAD, UNSTABLE POSTINFARCT ANGINA  POST-OPERATIVE DIAGNOSIS:  CAD, UNSTABLE POSTINFARCT ANGINA  PROCEDURE:  Procedure(s): CORONARY ARTERY BYPASS GRAFTING (CABG) x3   LIMA to LAD  SVG to OM  SVG to PDA TRANSESOPHAGEAL ECHOCARDIOGRAM (TEE) EVH  RIGHT THIGH GREATER SAPHENOUS VEIN  SURGEON:  Surgeon(s): Melrose Nakayama, MD  PHYSICIAN ASSISTANT: WAYNE GOLD PA-C  ANESTHESIA:   general  PATIENT CONDITION:  ICU - intubated and hemodynamically stable.  PRE-OPERATIVE WEIGHT: 94kg  EBL: SEE ANEST/PERFUSION RECORDS   Patient in rapid a fib and hypoxic on NRB in preop holding Markedly elevated PAP. 2-3+ MR on prebypass TEE Good targets and good conduits 1+ MR on postbypass TEE  Remo Lipps C. [name], MD [facility] [phone]

## 2014-09-20 NOTE — Progress Notes (Signed)
Verified with Dr. Prescott Gum, ok to attempt wean and extubate if pt meets parameters.  Vista Lawman, RN

## 2014-09-20 NOTE — Progress Notes (Signed)
ANTICOAGULATION CONSULT NOTE - Follow Up Consult  Pharmacy Consult for Heparin Indication: CAD  No Known Allergies  Patient Measurements: Height: 5\' 11"  (180.3 cm) Weight: 209 lb 3.5 oz (94.9 kg) IBW/kg (Calculated) : 75.3 Heparin Dosing Weight: 93 kg  Vital Signs: Temp: 97.5 F (36.4 C) (04/22 0347) Temp Source: Oral (04/22 0347) BP: 89/54 mmHg (04/22 0400) Pulse Rate: 71 (04/22 0400)  Labs:  Recent Labs  09/18/14 0615  09/18/14 1650 09/18/14 2045 09/18/14 2307 09/19/14 0446 09/20/14 0250  HGB 13.0  --   --   --   --  13.1 13.1  HCT 37.6*  --   --   --   --  38.2* 38.4*  PLT 146*  --   --   --   --  135* 153  APTT 76*  --   --   --   --   --   --   LABPROT 16.5*  --   --   --   --   --   --   INR 1.32  --   --   --   --   --   --   HEPARINUNFRC 0.22*  < >  --  0.37  --  0.33 0.22*  CREATININE 0.96  --   --   --   --   --   --   TROPONINI  --   --  2.63*  --  2.81* 3.59*  --   < > = values in this interval not displayed.  Estimated Creatinine Clearance: 72.1 mL/min (by C-G formula based on Cr of 0.96).    Assessment: 81 year old man admitted 09/17/2014 on transfer to [facility] from [facility]. 4/19 cath, Critical CAD found.  s/p Plavix (last 4/19). Heparin level this morning dropped to 0.22, below goal on Heparin drip @ 1500 units/hr.  RN reports heparin infusing at correct rate and no problems with infusion. Patient is to go for CABG this morning. RN reports that MD wants him to stay on heparin when they call patient to go to surgery.  CBC stable, no bleeding noted.    Goal of Therapy:  Heparin level 0.3-0.7 units/ml Monitor platelets by anticoagulation protocol: Yes   Plan:  Increase heparin to 1600 units/hr.  Patient to go to OR today for CABG.    Thank you for allowing pharmacy to be a part of this patients care team.  Nicole Cella, RPh Clinical Pharmacist Pager: [phone] 09/20/2014, 04:55

## 2014-09-20 NOTE — Anesthesia Preprocedure Evaluation (Addendum)
Anesthesia Evaluation  Patient identified by MRN, date of birth, ID band Patient awake    Reviewed: Allergy & Precautions, NPO status , Patient's Chart, lab work & pertinent test results  History of Anesthesia Complications Negative for: history of anesthetic complications  Airway Mallampati: II  TM Distance: >3 FB Neck ROM: Full    Dental  (+) Dental Advisory Given   Pulmonary former smoker,    Pulmonary exam normal       Cardiovascular hypertension, + angina + CAD, + Past MI and + Peripheral Vascular Disease  Coronary artery disease     a. PCI/DES to mLAD and dRCA in 2005, residual OM1 50%; b. nuclear stress test 02/2008: no ischemia; c. nuclear stress test 11/2009: mild ischemia in the inferior wall c/w previous stress test; d. cath 09/17/2014: critical ostial/prox LAD estimated at 90-95%, critical prox LCx, RCA w/ mild diff dz, EF >55%, no WMA       Neuro/Psych negative neurological ROS  negative psych ROS   GI/Hepatic Neg liver ROS, GERD-  ,  Endo/Other  negative endocrine ROS  Renal/GU negative Renal ROS     Musculoskeletal   Abdominal   Peds  Hematology   Anesthesia Other Findings   Reproductive/Obstetrics                            Anesthesia Physical Anesthesia Plan  ASA: IV  Anesthesia Plan: General   Post-op Pain Management:    Induction: Intravenous  Airway Management Planned: Oral ETT  Additional Equipment: Arterial line, PA Cath, 3D TEE and Ultrasound Guidance Line Placement  Intra-op Plan:   Post-operative Plan: Post-operative intubation/ventilation  Informed Consent:   Plan Discussed with:   Anesthesia Plan Comments:         Anesthesia Quick Evaluation

## 2014-09-20 NOTE — Significant Event (Signed)
Weaning FIO2 from 100%, based on ABG and patient's vital signs. Weaned by RRT.   1800pm-Now, FiO2 @50 %, Peep 5 (down from 8). Saturation 96% in this setting.

## 2014-09-20 NOTE — Transfer of Care (Signed)
Immediate Anesthesia Transfer of Care Note  Patient: Crist Infante  Procedure(s) Performed: Procedure(s): CORONARY ARTERY BYPASS GRAFTING (CABG) x   three using left internal mammary artery and right leg greater saphenous vein harvested endoscopically (N/A) TRANSESOPHAGEAL ECHOCARDIOGRAM (TEE) (N/A)  Patient Location: SICU  Anesthesia Type:General  Level of Consciousness: sedated and Patient remains intubated per anesthesia plan  Airway & Oxygen Therapy: Patient remains intubated per anesthesia plan and Patient placed on Ventilator (see vital sign flow sheet for setting)  Post-op Assessment: Report given to RN and Post -op Vital signs reviewed and stable  Post vital signs: Reviewed and stable  Last Vitals:  Filed Vitals:   09/20/14 0600  BP: 107/60  Pulse: 121  Temp:   Resp: 23    Complications: No apparent anesthesia complications

## 2014-09-21 ENCOUNTER — Inpatient Hospital Stay (HOSPITAL_COMMUNITY): Payer: Medicare Other

## 2014-09-21 LAB — CBC
HCT: 29.4 % — ABNORMAL LOW (ref 39.0–52.0)
HCT: 30.1 % — ABNORMAL LOW (ref 39.0–52.0)
Hemoglobin: 10.3 g/dL — ABNORMAL LOW (ref 13.0–17.0)
Hemoglobin: 10.4 g/dL — ABNORMAL LOW (ref 13.0–17.0)
MCH: 29.9 pg (ref 26.0–34.0)
MCH: 29.8 pg (ref 26.0–34.0)
MCHC: 35 g/dL (ref 30.0–36.0)
MCHC: 34.6 g/dL (ref 30.0–36.0)
MCV: 85.5 fL (ref 78.0–100.0)
MCV: 86.2 fL (ref 78.0–100.0)
PLATELETS: 127 10*3/uL — AB (ref 150–400)
Platelets: 115 10*3/uL — ABNORMAL LOW (ref 150–400)
RBC: 3.44 MIL/uL — AB (ref 4.22–5.81)
RBC: 3.49 MIL/uL — ABNORMAL LOW (ref 4.22–5.81)
RDW: 15 % (ref 11.5–15.5)
RDW: 15 % (ref 11.5–15.5)
WBC: 11.9 10*3/uL — AB (ref 4.0–10.5)
WBC: 15.8 10*3/uL — ABNORMAL HIGH (ref 4.0–10.5)

## 2014-09-21 LAB — POCT I-STAT 3, ART BLOOD GAS (G3+)
ACID-BASE DEFICIT: 1 mmol/L (ref 0.0–2.0)
ACID-BASE DEFICIT: 1 mmol/L (ref 0.0–2.0)
ACID-BASE EXCESS: 1 mmol/L (ref 0.0–2.0)
ACID-BASE EXCESS: 2 mmol/L (ref 0.0–2.0)
Acid-base deficit: 1 mmol/L (ref 0.0–2.0)
Acid-base deficit: 1 mmol/L (ref 0.0–2.0)
Bicarbonate: 23.3 mEq/L (ref 20.0–24.0)
Bicarbonate: 22.9 mEq/L (ref 20.0–24.0)
Bicarbonate: 22.9 mEq/L (ref 20.0–24.0)
Bicarbonate: 23.3 mEq/L (ref 20.0–24.0)
Bicarbonate: 24 mEq/L (ref 20.0–24.0)
Bicarbonate: 22.4 mEq/L (ref 20.0–24.0)
Bicarbonate: 25.7 mEq/L — ABNORMAL HIGH (ref 20.0–24.0)
O2 SAT: 90 %
O2 SAT: 94 %
O2 SAT: 93 %
O2 Saturation: 93 %
O2 Saturation: 94 %
O2 Saturation: 89 %
O2 Saturation: 90 %
PCO2 ART: 33.4 mmHg — AB (ref 35.0–45.0)
PCO2 ART: 36 mmHg (ref 35.0–45.0)
PH ART: 7.454 — AB (ref 7.350–7.450)
PH ART: 7.419 (ref 7.350–7.450)
PH ART: 7.42 (ref 7.350–7.450)
PO2 ART: 67 mmHg — AB (ref 80.0–100.0)
PO2 ART: 56 mmHg — AB (ref 80.0–100.0)
PO2 ART: 68 mmHg — AB (ref 80.0–100.0)
Patient temperature: 37.7
Patient temperature: 37.3
Patient temperature: 37.2
Patient temperature: 37
TCO2: 24 mmol/L (ref 0–100)
TCO2: 24 mmol/L (ref 0–100)
TCO2: 24 mmol/L (ref 0–100)
TCO2: 24 mmol/L (ref 0–100)
TCO2: 25 mmol/L (ref 0–100)
TCO2: 23 mmol/L (ref 0–100)
TCO2: 27 mmol/L (ref 0–100)
pCO2 arterial: 34.3 mmHg — ABNORMAL LOW (ref 35.0–45.0)
pCO2 arterial: 35.6 mmHg (ref 35.0–45.0)
pCO2 arterial: 32.9 mmHg — ABNORMAL LOW (ref 35.0–45.0)
pCO2 arterial: 33.7 mmHg — ABNORMAL LOW (ref 35.0–45.0)
pCO2 arterial: 34 mmHg — ABNORMAL LOW (ref 35.0–45.0)
pH, Arterial: 7.435 (ref 7.350–7.450)
pH, Arterial: 7.471 — ABNORMAL HIGH (ref 7.350–7.450)
pH, Arterial: 7.43 (ref 7.350–7.450)
pH, Arterial: 7.485 — ABNORMAL HIGH (ref 7.350–7.450)
pO2, Arterial: 56 mmHg — ABNORMAL LOW (ref 80.0–100.0)
pO2, Arterial: 69 mmHg — ABNORMAL LOW (ref 80.0–100.0)
pO2, Arterial: 54 mmHg — ABNORMAL LOW (ref 80.0–100.0)
pO2, Arterial: 59 mmHg — ABNORMAL LOW (ref 80.0–100.0)

## 2014-09-21 LAB — GLUCOSE, CAPILLARY
GLUCOSE-CAPILLARY: 100 mg/dL — AB (ref 70–99)
GLUCOSE-CAPILLARY: 108 mg/dL — AB (ref 70–99)
GLUCOSE-CAPILLARY: 109 mg/dL — AB (ref 70–99)
GLUCOSE-CAPILLARY: 110 mg/dL — AB (ref 70–99)
GLUCOSE-CAPILLARY: 116 mg/dL — AB (ref 70–99)
GLUCOSE-CAPILLARY: 119 mg/dL — AB (ref 70–99)
GLUCOSE-CAPILLARY: 125 mg/dL — AB (ref 70–99)
GLUCOSE-CAPILLARY: 131 mg/dL — AB (ref 70–99)
GLUCOSE-CAPILLARY: 151 mg/dL — AB (ref 70–99)
GLUCOSE-CAPILLARY: 157 mg/dL — AB (ref 70–99)
GLUCOSE-CAPILLARY: 99 mg/dL (ref 70–99)
Glucose-Capillary: 150 mg/dL — ABNORMAL HIGH (ref 70–99)
Glucose-Capillary: 111 mg/dL — ABNORMAL HIGH (ref 70–99)
Glucose-Capillary: 125 mg/dL — ABNORMAL HIGH (ref 70–99)
Glucose-Capillary: 161 mg/dL — ABNORMAL HIGH (ref 70–99)
Glucose-Capillary: 142 mg/dL — ABNORMAL HIGH (ref 70–99)
Glucose-Capillary: 107 mg/dL — ABNORMAL HIGH (ref 70–99)
Glucose-Capillary: 112 mg/dL — ABNORMAL HIGH (ref 70–99)
Glucose-Capillary: 201 mg/dL — ABNORMAL HIGH (ref 70–99)

## 2014-09-21 LAB — POCT I-STAT, CHEM 8
BUN: 22 mg/dL (ref 6–23)
Calcium, Ion: 1.09 mmol/L — ABNORMAL LOW (ref 1.13–1.30)
Chloride: 99 mmol/L (ref 96–112)
Creatinine, Ser: 0.9 mg/dL (ref 0.50–1.35)
Glucose, Bld: 163 mg/dL — ABNORMAL HIGH (ref 70–99)
HEMATOCRIT: 31 % — AB (ref 39.0–52.0)
HEMOGLOBIN: 10.5 g/dL — AB (ref 13.0–17.0)
POTASSIUM: 4.3 mmol/L (ref 3.5–5.1)
SODIUM: 131 mmol/L — AB (ref 135–145)
TCO2: 21 mmol/L (ref 0–100)

## 2014-09-21 LAB — MAGNESIUM
MAGNESIUM: 2.2 mg/dL (ref 1.5–2.5)
Magnesium: 2.4 mg/dL (ref 1.5–2.5)

## 2014-09-21 LAB — BASIC METABOLIC PANEL
Anion gap: 8 (ref 5–15)
BUN: 21 mg/dL (ref 6–23)
CALCIUM: 8 mg/dL — AB (ref 8.4–10.5)
CO2: 25 mmol/L (ref 19–32)
CREATININE: 0.92 mg/dL (ref 0.50–1.35)
Chloride: 101 mmol/L (ref 96–112)
GFR calc Af Amer: 90 mL/min — ABNORMAL LOW (ref >90)
GFR calc non Af Amer: 78 mL/min — ABNORMAL LOW (ref >90)
Glucose, Bld: 122 mg/dL — ABNORMAL HIGH (ref 70–99)
Potassium: 3.8 mmol/L (ref 3.5–5.1)
Sodium: 134 mmol/L — ABNORMAL LOW (ref 135–145)

## 2014-09-21 LAB — CREATININE, SERUM
CREATININE: 0.99 mg/dL (ref 0.50–1.35)
GFR calc Af Amer: 87 mL/min — ABNORMAL LOW (ref >90)
GFR calc non Af Amer: 75 mL/min — ABNORMAL LOW (ref >90)

## 2014-09-21 MED ORDER — CETYLPYRIDINIUM CHLORIDE 0.05 % MT LIQD
7.0000 mL | Freq: Two times a day (BID) | OROMUCOSAL | Status: DC
  Administered 2014-09-21 – 2014-09-23 (×6): 7 mL via OROMUCOSAL

## 2014-09-21 MED ORDER — AMIODARONE LOAD VIA INFUSION
150.0000 mg | Freq: Once | INTRAVENOUS | Status: AC
  Administered 2014-09-21: 150 mg via INTRAVENOUS
  Filled 2014-09-21: qty 83.34

## 2014-09-21 MED ORDER — CHLORHEXIDINE GLUCONATE 0.12 % MT SOLN
15.0000 mL | Freq: Two times a day (BID) | OROMUCOSAL | Status: DC
  Administered 2014-09-21: 15 mL via OROMUCOSAL
  Filled 2014-09-21: qty 15

## 2014-09-21 MED ORDER — FUROSEMIDE 10 MG/ML IJ SOLN
4.0000 mg/h | INTRAVENOUS | Status: DC
  Administered 2014-09-21: 4 mg/h via INTRAVENOUS
  Filled 2014-09-21 (×3): qty 25

## 2014-09-21 MED ORDER — LEVALBUTEROL HCL 1.25 MG/0.5ML IN NEBU
1.2500 mg | INHALATION_SOLUTION | Freq: Four times a day (QID) | RESPIRATORY_TRACT | Status: DC
  Filled 2014-09-21 (×5): qty 0.5

## 2014-09-21 MED ORDER — INSULIN ASPART 100 UNIT/ML ~~LOC~~ SOLN
0.0000 [IU] | SUBCUTANEOUS | Status: DC
  Administered 2014-09-21: 2 [IU] via SUBCUTANEOUS
  Administered 2014-09-21: 8 [IU] via SUBCUTANEOUS
  Administered 2014-09-22 (×2): 4 [IU] via SUBCUTANEOUS
  Administered 2014-09-22: 8 [IU] via SUBCUTANEOUS
  Administered 2014-09-22 (×2): 4 [IU] via SUBCUTANEOUS
  Administered 2014-09-23: 2 [IU] via SUBCUTANEOUS

## 2014-09-21 MED ORDER — BUDESONIDE-FORMOTEROL FUMARATE 160-4.5 MCG/ACT IN AERO
2.0000 | INHALATION_SPRAY | Freq: Two times a day (BID) | RESPIRATORY_TRACT | Status: DC
  Administered 2014-09-21 – 2014-09-24 (×6): 2 via RESPIRATORY_TRACT
  Filled 2014-09-21: qty 6

## 2014-09-21 MED ORDER — INSULIN DETEMIR 100 UNIT/ML ~~LOC~~ SOLN
8.0000 [IU] | Freq: Two times a day (BID) | SUBCUTANEOUS | Status: DC
  Administered 2014-09-21 – 2014-09-23 (×6): 8 [IU] via SUBCUTANEOUS
  Filled 2014-09-21 (×8): qty 0.08

## 2014-09-21 MED ORDER — LEVALBUTEROL HCL 1.25 MG/0.5ML IN NEBU
1.2500 mg | INHALATION_SOLUTION | Freq: Four times a day (QID) | RESPIRATORY_TRACT | Status: DC
  Administered 2014-09-21 – 2014-09-22 (×6): 1.25 mg via RESPIRATORY_TRACT
  Filled 2014-09-21 (×10): qty 0.5

## 2014-09-21 MED ORDER — CETYLPYRIDINIUM CHLORIDE 0.05 % MT LIQD
7.0000 mL | Freq: Four times a day (QID) | OROMUCOSAL | Status: DC
  Administered 2014-09-21: 7 mL via OROMUCOSAL

## 2014-09-21 NOTE — Progress Notes (Signed)
Attempted wean, pt tolerated well but on ABG pO2 59.  Pt placed back on PRVC 50/5/12.  Will re-attempt later tonight.  Vista Lawman, RN

## 2014-09-21 NOTE — Progress Notes (Signed)
1 Day Post-Op Procedure(s) (LRB): CORONARY ARTERY BYPASS GRAFTING (CABG) x   three using left internal mammary artery and right leg greater saphenous vein harvested endoscopically (N/A) TRANSESOPHAGEAL ECHOCARDIOGRAM (TEE) (N/A) Subjective: Patient hemodynamics stable after CABG, sinus rhythm on amiodarone Unable to wean ventilator because of PO2 less than 60 Chest x-ray with COPD, interstitial edema We'll start Lasix diuresis nebulized therapy and repeat ABGs Patient appears to be neuro intact  Objective: Vital signs in last 24 hours: Temp:  [98.4 F (36.9 C)-100.4 F (38 C)] 99.7 F (37.6 C) (04/23 1000) Pulse Rate:  [89-129] 107 (04/23 1000) Cardiac Rhythm:  [-] Atrial paced (04/23 0800) Resp:  [12-24] 19 (04/23 1000) BP: (91-120)/(52-69) 106/69 mmHg (04/23 1000) SpO2:  [83 %-100 %] 94 % (04/23 1000) Arterial Line BP: (89-128)/(43-67) 123/59 mmHg (04/23 1000) FiO2 (%):  [40 %-100 %] 50 % (04/23 1000) Weight:  [223 lb 1.7 oz (101.2 kg)] 223 lb 1.7 oz (101.2 kg) (04/23 0400)  Hemodynamic parameters for last 24 hours: PAP: (16-46)/(13-29) 36/18 mmHg CO:  [3.4 L/min-6.2 L/min] 4.6 L/min CI:  [1.6 L/min/m2-2.9 L/min/m2] 2.2 L/min/m2  Intake/Output from previous day: 04/22 0701 - 04/23 0700 In: 4350.7 [I.V.:2615.7; Blood:375; NG/GT:30; IV VELFYBOFB:5102] Out: 2890 [HENID:7824; Emesis/NG output:150; Blood:1000; Chest Tube:120] Intake/Output this shift: Total I/O In: 291.5 [I.V.:291.5] Out: 130 [Urine:120; Chest Tube:10]  Scattered rhonchi Extremities warm No chest tube airleak  Lab Results:  Recent Labs  09/20/14 1745 09/20/14 1748 09/21/14 0400  WBC 17.2*  --  11.9*  HGB 11.3* 10.9* 10.3*  HCT 31.9* 32.0* 29.4*  PLT 121*  --  115*   BMET:  Recent Labs  09/20/14 1748 09/21/14 0400  NA 135 134*  K 3.6 3.8  CL 99 101  CO2  --  25  GLUCOSE 151* 122*  BUN 24* 21  CREATININE 0.90 0.92  CALCIUM  --  8.0*    PT/INR:  Recent Labs  09/20/14 1225  LABPROT  21.5*  INR 1.85*   ABG    Component Value Date/Time   PHART 7.454* 09/21/2014 0402   HCO3 23.3 09/21/2014 0402   TCO2 24 09/21/2014 0402   ACIDBASEDEF 1.0 09/20/2014 2256   O2SAT 90.0 09/21/2014 0402   CBG (last 3)   Recent Labs  09/21/14 0503 09/21/14 0602 09/21/14 0653  GLUCAP 116* 161* 151*    Assessment/Plan: S/P Procedure(s) (LRB): CORONARY ARTERY BYPASS GRAFTING (CABG) x   three using left internal mammary artery and right leg greater saphenous vein harvested endoscopically (N/A) TRANSESOPHAGEAL ECHOCARDIOGRAM (TEE) (N/A) IV Lasix diuresis Wean ventilator as tolerated  LOS: 4 days    Tharon Aquas Trigt III 09/21/2014

## 2014-09-21 NOTE — Procedures (Signed)
Extubation Procedure Note  Patient Details:   Name: Christopher [name] DOB: [birth year] MRN: [id number]   Airway Documentation:     Evaluation  O2 sats: stable throughout Complications: No apparent complications Patient did tolerate procedure well. Bilateral Breath Sounds: Clear, Diminished Suctioning: Airway Yes  Patient tolerated rapid wean on 50% FiO2 per MD order. NIF -26 and VC 0.75L. Positive for cuff leak. Patient extubated to a 55% Venturi Mask. No signs of dyspnea or stridor. Patient instructed on the Incentive Spirometer achieving 800 mL five times, with good patient effort. RN at bedside.   Myrtie Neither 09/21/2014, 12:46 PM

## 2014-09-21 NOTE — Op Note (Signed)
[name], [name] NO.:  [id number]  MEDICAL RECORD NO.:  [id number]  LOCATION:  2S16C                        FACILITY:  [facility]  PHYSICIAN:  [name]. [name], M.D.DATE OF BIRTH:  [birth year]  DATE OF PROCEDURE:  09/20/2014 DATE OF DISCHARGE:                              OPERATIVE REPORT   PREOPERATIVE DIAGNOSIS:  Severe three-vessel coronary disease with unstable post infarct angina, atrial fibrillation, pulmonary edema.  POSTOPERATIVE DIAGNOSIS:  Severe three-vessel coronary disease with unstable post infarct angina, atrial fibrillation, pulmonary edema.  PROCEDURE:   Coronary artery bypass grafting x3  LIMA to LAD  Saphenous vein graft to obtuse marginal  Saphenous vein graft to posterior descending Endoscopic vein harvest right thigh.  SURGEON:  [name]. [name], M.D.  ASSISTANT:  John Giovanni, P.A.-C.  ANESTHESIA:  General.  FINDINGS:  Prebypass transesophageal echocardiography showed anterolateral severe hypokinesis with 2 to 3+ mitral regurgitation. Postbypass transesophageal echocardiography showed improved wall motion and  improved mitral regurgitation once stabilized.  Good quality targets and good quality conduits.  CLINICAL NOTE:  Christopher [name] is an 81 year old man with a known history of coronary disease.  He was doing well until 4 days prior to admission when he began experiencing chest pain.  He ultimately presented to [facility] where he ruled out for an MI with troponins. However, on cardiac catheterization, he had severe three- vessel disease with critical stenoses in his proximal LAD, circumflex, and posterior descending.  He was transferred to [facility] for coronary bypass grafting.  He had been on Plavix prior to admission; however, given the severity of his disease, it was felt that waiting a full 5-7 days for Plavix wash out was not advisable.  The patient was scheduled for surgery on the morning of September 20, 2014.  On the evening prior to surgery, the patient became hypoxic and hypotensive.  He had rapid atrial fibrillation.  On arrival to the preoperative holding area, the patient was on 100% non-rebreather with marginal saturations, heart rate in the 120s, and was hypotensive.  He was taken urgently to the operating room, anesthetized and intubated. Transesophageal echocardiography was performed.  It showed anterolateral hypokinesis and 2 to 3+ mitral regurgitation. Pulmonary artery pressures were markedly elevated.  A Foley catheter was placed.  The chest, abdomen, and legs were prepped and draped in the usual sterile fashion.  Of note, during the preparation, the patient converted to sinus rhythm and had no significant ST changes.  Once that occurred, a median sternotomy was performed and the left internal mammary artery was harvested using standard technique.  Simultaneously an incision was made in the medial aspect of the right leg at the level of knee and the greater saphenous vein was harvested from the right thigh endoscopically.  5000 units of heparin was administered during the vessel harvest.  The remainder of the full heparin dose was given prior to opening the pericardium.  After harvesting the conduits, which were both good quality, the pericardium was opened. The remainder of full heparin dose was given The ascending aorta was inspected.  It was cannulated via concentric 2-0 Ethibond pledgeted pursestring sutures. A dual-stage venous cannula was  placed via a pursestring suture in the right atrial appendage.  Cardiopulmonary bypass was intiated, and flows were maintained per protocol. The patient was cooled to 32 degrees Celsius.  The coronary arteries were inspected and anastomotic sites were chosen.  The conduits were inspected and cut to length.  A foam pad was placed in the pericardium to insulate the heart. A temperature probe was placed in myocardial septum.  A  retrograde cardioplegia cannula was placed via a pursestring suture in the right atrium and directed into the coronary sinus.  An antegrade cardioplegia cannula was placed in the ascending aorta.  The aorta was crossclamped.  The left ventricle was emptied via the aortic root vent.  Cardiac arrest then was achieved with a combination of cold antegrade and retrograde blood cardioplegia and topical iced saline.  An initial 500 mL of cardioplegia was administered antegrade.  There was a rapid diastolic arrest and also rapid septal cooling.  An additional 500 mL of cardioplegia was administered retrograde.  A reversed saphenous vein graft then was placed end to side to obtuse marginal 1.  This was a large lateral branch, and had a tight 99% stenosis.  The vein was anastomosed end to side with a running 7-0 Prolene suture. Both the vein and target were good quality.  At the completion of anastomosis, a probe passed easily proximally and distally.  Cardioplegia was administered.  There was good flow and good hemostasis.  Next, a reversed saphenous vein graft was placed end to side to the posterior descending branch of the right coronary.  This vessel had a 95% stenosis. It was a good quality vessel although smaller than the other 2 targets.  A 1.5-mm probe did pass distally from the anastomosis. An end-to-side anastomosis was performed with a running 7-0 Prolene suture.  Both the artery and vein were good quality. There was good flow and good hemostasis with cardioplegia administration.  Next, the left internal mammary artery was brought through a window in the pericardium.  The distal end was beveled.  It was anastomosed end to side to the distal LAD.  The LAD was a 2-mm good quality target. The mammary was a 2-mm good quality conduit.  An end-to-side anastomosis was performed with a running 8-0 Prolene suture.  At the completion of anastomosis, the bulldog clamp was removed.  There was good  hemostasis and rapid septal rewarming was noted.  The bulldog clamp was replaced. The mammary pedicle was tacked to the epicardial surface of the heart with 6-0 Prolene sutures.  Additional cardioplegia was administered.  The vein grafts were cut to length.  The proximal vein graft anastomoses were performed to 4.5-mm punch aortotomies with running 6-0 Prolene sutures.  At the completion of the final proximal anastomosis, the patient was placed in Trendelenburg position.  Lidocaine was administered.  The aortic root was de-aired.  The bulldog clamp was again removed from the left mammary artery.  After de-airing the aortic root, the aortic crossclamp was removed with a total crossclamp time of 62 minutes.  While rewarming was completed, all proximal and distal anastomoses were inspected for hemostasis.  Epicardial pacing wires were placed on the right ventricle and right atrium.  The patient initially was in an undetermined rhythm.  It was either accelerated junctional or atrial fibrillation with a relatively regular ventriclar response.  P-waves could be seen periodically, but were not consistent.  A dopamine infusion at 3 mcg/kg per minute had been started at the initiation of bypass, it  was increased to 5 mcg/kg per minute prior to weaning from bypass.  The patient then was weaned from cardiopulmonary bypass.  Initially on weaning, the patient was having significant arrhythmias and atrial fibrillation and also frequent PVCs.  Initially, his PA pressures were high and his mitral regurgitation was still moderate; however, over time, the patient became more stable and had less ectopy and eventually was DDD paced at 90 beats per minute which suppressed any ectopy and his PA pressures returned to normal and the mitral regurgitation went from 2 to 1+.  A test dose of protamine was administered and was well tolerated.  The atrial and aortic cannulae were removed.  The remainder of protamine  was administered slowly without incident.  The chest irrigated with warm saline.  Hemostasis was achieved.  The pericardium was not closed.  A left pleural and mediastinal chest tubes were placed a separate subcostal incisions.  The sternum was closed with a combination of single and double heavy gauge stainless steel wires.  The pectoralis fascia, subcutaneous tissue, and skin were closed in standard fashion.  All sponge, needle, and instrument counts were correct at the end of the procedure.  The patient was taken from the operating room to the surgical intensive care unit in fair condition.     [name] [name], M.D.     SCH/MEDQ  D:  09/20/2014  T:  09/21/2014  Job:  [id number]

## 2014-09-21 NOTE — Progress Notes (Signed)
Attempted to wean again.  Pt's pO2 on ABG 56.  Pt switched back over to [facility].   Vista Lawman, RN

## 2014-09-22 ENCOUNTER — Inpatient Hospital Stay (HOSPITAL_COMMUNITY): Payer: Medicare Other

## 2014-09-22 LAB — BLOOD GAS, ARTERIAL
Acid-Base Excess: 3.4 mmol/L — ABNORMAL HIGH (ref 0.0–2.0)
Bicarbonate: 26.7 mEq/L — ABNORMAL HIGH (ref 20.0–24.0)
Drawn by: 252031
FIO2: 1 %
O2 Saturation: 89.2 %
Patient temperature: 98.6
TCO2: 27.8 mmol/L (ref 0–100)
pCO2 arterial: 35.4 mmHg (ref 35.0–45.0)
pH, Arterial: 7.489 — ABNORMAL HIGH (ref 7.350–7.450)
pO2, Arterial: 56.9 mmHg — ABNORMAL LOW (ref 80.0–100.0)

## 2014-09-22 LAB — CBC
HCT: 30.2 % — ABNORMAL LOW (ref 39.0–52.0)
Hemoglobin: 10.2 g/dL — ABNORMAL LOW (ref 13.0–17.0)
MCH: 29.3 pg (ref 26.0–34.0)
MCHC: 33.8 g/dL (ref 30.0–36.0)
MCV: 86.8 fL (ref 78.0–100.0)
Platelets: 126 10*3/uL — ABNORMAL LOW (ref 150–400)
RBC: 3.48 MIL/uL — ABNORMAL LOW (ref 4.22–5.81)
RDW: 15 % (ref 11.5–15.5)
WBC: 11.9 10*3/uL — ABNORMAL HIGH (ref 4.0–10.5)

## 2014-09-22 LAB — POCT I-STAT 3, ART BLOOD GAS (G3+)
ACID-BASE EXCESS: 2 mmol/L (ref 0.0–2.0)
Bicarbonate: 25.3 mEq/L — ABNORMAL HIGH (ref 20.0–24.0)
O2 SAT: 96 %
PH ART: 7.456 — AB (ref 7.350–7.450)
Patient temperature: 98.7
TCO2: 26 mmol/L (ref 0–100)
pCO2 arterial: 35.9 mmHg (ref 35.0–45.0)
pO2, Arterial: 77 mmHg — ABNORMAL LOW (ref 80.0–100.0)

## 2014-09-22 LAB — GLUCOSE, CAPILLARY
GLUCOSE-CAPILLARY: 218 mg/dL — AB (ref 70–99)
GLUCOSE-CAPILLARY: 184 mg/dL — AB (ref 70–99)
Glucose-Capillary: 161 mg/dL — ABNORMAL HIGH (ref 70–99)
Glucose-Capillary: 166 mg/dL — ABNORMAL HIGH (ref 70–99)
Glucose-Capillary: 184 mg/dL — ABNORMAL HIGH (ref 70–99)
Glucose-Capillary: 225 mg/dL — ABNORMAL HIGH (ref 70–99)

## 2014-09-22 LAB — BASIC METABOLIC PANEL
Anion gap: 13 (ref 5–15)
BUN: 17 mg/dL (ref 6–23)
CO2: 24 mmol/L (ref 19–32)
Calcium: 7.5 mg/dL — ABNORMAL LOW (ref 8.4–10.5)
Chloride: 95 mmol/L — ABNORMAL LOW (ref 96–112)
Creatinine, Ser: 1 mg/dL (ref 0.50–1.35)
GFR calc Af Amer: 80 mL/min — ABNORMAL LOW (ref >90)
GFR calc non Af Amer: 69 mL/min — ABNORMAL LOW (ref >90)
Glucose, Bld: 237 mg/dL — ABNORMAL HIGH (ref 70–99)
Potassium: 3.8 mmol/L (ref 3.5–5.1)
Sodium: 132 mmol/L — ABNORMAL LOW (ref 135–145)

## 2014-09-22 MED ORDER — HYDROCOD POLST-CPM POLST ER 10-8 MG/5ML PO SUER
5.0000 mL | Freq: Two times a day (BID) | ORAL | Status: DC
  Administered 2014-09-22 – 2014-09-23 (×3): 5 mL via ORAL
  Filled 2014-09-22 (×3): qty 5

## 2014-09-22 NOTE — Progress Notes (Signed)
2 Days Post-Op Procedure(s) (LRB): CORONARY ARTERY BYPASS GRAFTING (CABG) x   three using left internal mammary artery and right leg greater saphenous vein harvested endoscopically (N/A) TRANSESOPHAGEAL ECHOCARDIOGRAM (TEE) (N/A) Subjective Needs 100% mask Now in NSR Weaning pressors Objective: Vital signs in last 24 hours: Temp:  [97.5 F (36.4 C)-99.1 F (37.3 C)] 97.9 F (36.6 C) (04/24 1148) Pulse Rate:  [67-119] 67 (04/24 1200) Cardiac Rhythm:  [-] Normal sinus rhythm (04/24 1200) Resp:  [18-35] 23 (04/24 1200) BP: (89-114)/(53-74) 101/53 mmHg (04/24 1200) SpO2:  [89 %-100 %] 96 % (04/24 1200) Arterial Line BP: (89-250)/(4-67) 139/48 mmHg (04/24 1200) FiO2 (%):  [55 %] 55 % (04/23 1235) Weight:  [227 lb 1.2 oz (103 kg)] 227 lb 1.2 oz (103 kg) (04/24 0500)  Hemodynamic parameters for last 24 hours: PAP: (39-45)/(23-25) 45/24 mmHg  Intake/Output from previous day: 04/23 0701 - 04/24 0700 In: 2267.2 [P.O.:240; I.V.:1857.2; NG/GT:120; IV Piggyback:50] Out: 2412 [Urine:2370; Chest Tube:42] Intake/Output this shift: Total I/O In: 281.3 [I.V.:281.3] Out: 475 [Urine:475]  No wheezing  Lab Results:  Recent Labs  09/21/14 1645 09/21/14 1700 09/22/14 0400  WBC 15.8*  --  11.9*  HGB 10.4* 10.5* 10.2*  HCT 30.1* 31.0* 30.2*  PLT 127*  --  126*   BMET:  Recent Labs  09/21/14 0400  09/21/14 1700 09/22/14 0400  NA 134*  --  131* 132*  K 3.8  --  4.3 3.8  CL 101  --  99 95*  CO2 25  --   --  24  GLUCOSE 122*  --  163* 237*  BUN 21  --  22 17  CREATININE 0.92  < > 0.90 1.00  CALCIUM 8.0*  --   --  7.5*  < > = values in this interval not displayed.  PT/INR:  Recent Labs  09/20/14 1225  LABPROT 21.5*  INR 1.85*   ABG    Component Value Date/Time   PHART 7.456* 09/22/2014 1103   HCO3 25.3* 09/22/2014 1103   TCO2 26 09/22/2014 1103   ACIDBASEDEF 1.0 09/21/2014 1810   O2SAT 96.0 09/22/2014 1103   CBG (last 3)   Recent Labs  09/21/14 2338 09/22/14 0353  09/22/14 0716  GLUCAP 225* 184* 218*    Assessment/Plan: S/P Procedure(s) (LRB): CORONARY ARTERY BYPASS GRAFTING (CABG) x   three using left internal mammary artery and right leg greater saphenous vein harvested endoscopically (N/A) TRANSESOPHAGEAL ECHOCARDIOGRAM (TEE) (N/A) Mobilize Diuresis d/c tubes/lines   LOS: 5 days    Christopher [name] 09/22/2014

## 2014-09-23 ENCOUNTER — Encounter (HOSPITAL_COMMUNITY): Payer: Self-pay | Admitting: Thoracic Surgery (Cardiothoracic Vascular Surgery)

## 2014-09-23 ENCOUNTER — Inpatient Hospital Stay (HOSPITAL_COMMUNITY): Payer: Medicare Other

## 2014-09-23 LAB — BASIC METABOLIC PANEL
Anion gap: 12 (ref 5–15)
BUN: 24 mg/dL — ABNORMAL HIGH (ref 6–23)
CO2: 28 mmol/L (ref 19–32)
Calcium: 7.7 mg/dL — ABNORMAL LOW (ref 8.4–10.5)
Chloride: 90 mmol/L — ABNORMAL LOW (ref 96–112)
Creatinine, Ser: 0.92 mg/dL (ref 0.50–1.35)
GFR calc Af Amer: 90 mL/min — ABNORMAL LOW (ref >90)
GFR calc non Af Amer: 78 mL/min — ABNORMAL LOW (ref >90)
Glucose, Bld: 128 mg/dL — ABNORMAL HIGH (ref 70–99)
Potassium: 3 mmol/L — ABNORMAL LOW (ref 3.5–5.1)
Sodium: 130 mmol/L — ABNORMAL LOW (ref 135–145)

## 2014-09-23 LAB — CBC
HCT: 28.4 % — ABNORMAL LOW (ref 39.0–52.0)
Hemoglobin: 9.6 g/dL — ABNORMAL LOW (ref 13.0–17.0)
MCH: 29.4 pg (ref 26.0–34.0)
MCHC: 33.8 g/dL (ref 30.0–36.0)
MCV: 87.1 fL (ref 78.0–100.0)
Platelets: 142 10*3/uL — ABNORMAL LOW (ref 150–400)
RBC: 3.26 MIL/uL — ABNORMAL LOW (ref 4.22–5.81)
RDW: 14.8 % (ref 11.5–15.5)
WBC: 11.2 10*3/uL — ABNORMAL HIGH (ref 4.0–10.5)

## 2014-09-23 LAB — GLUCOSE, CAPILLARY
GLUCOSE-CAPILLARY: 123 mg/dL — AB (ref 70–99)
GLUCOSE-CAPILLARY: 143 mg/dL — AB (ref 70–99)
GLUCOSE-CAPILLARY: 158 mg/dL — AB (ref 70–99)
Glucose-Capillary: 112 mg/dL — ABNORMAL HIGH (ref 70–99)
Glucose-Capillary: 122 mg/dL — ABNORMAL HIGH (ref 70–99)
Glucose-Capillary: 99 mg/dL (ref 70–99)

## 2014-09-23 LAB — POCT I-STAT 4, (NA,K, GLUC, HGB,HCT)
GLUCOSE: 103 mg/dL — AB (ref 70–99)
HCT: 30 % — ABNORMAL LOW (ref 39.0–52.0)
Hemoglobin: 10.2 g/dL — ABNORMAL LOW (ref 13.0–17.0)
Potassium: 4.2 mmol/L (ref 3.5–5.1)
Sodium: 130 mmol/L — ABNORMAL LOW (ref 135–145)

## 2014-09-23 LAB — SURGICAL PATHOLOGY

## 2014-09-23 MED ORDER — ASPIRIN EC 81 MG PO TBEC
81.0000 mg | DELAYED_RELEASE_TABLET | Freq: Every day | ORAL | Status: DC
  Administered 2014-09-23 – 2014-09-26 (×2): 81 mg via ORAL
  Filled 2014-09-23 (×4): qty 1

## 2014-09-23 MED ORDER — POTASSIUM CHLORIDE 10 MEQ/50ML IV SOLN
10.0000 meq | INTRAVENOUS | Status: AC
  Administered 2014-09-23 (×3): 10 meq via INTRAVENOUS
  Filled 2014-09-23 (×3): qty 50

## 2014-09-23 MED ORDER — AMIODARONE HCL 200 MG PO TABS
200.0000 mg | ORAL_TABLET | Freq: Two times a day (BID) | ORAL | Status: DC
  Administered 2014-09-23 – 2014-09-26 (×7): 200 mg via ORAL
  Filled 2014-09-23 (×9): qty 1

## 2014-09-23 MED ORDER — FUROSEMIDE 10 MG/ML IJ SOLN
40.0000 mg | Freq: Once | INTRAMUSCULAR | Status: AC
  Administered 2014-09-23: 40 mg via INTRAVENOUS
  Filled 2014-09-23: qty 4

## 2014-09-23 MED ORDER — DABIGATRAN ETEXILATE MESYLATE 150 MG PO CAPS
150.0000 mg | ORAL_CAPSULE | Freq: Two times a day (BID) | ORAL | Status: DC
  Administered 2014-09-23 (×2): 150 mg via ORAL
  Filled 2014-09-23 (×4): qty 1

## 2014-09-23 MED ORDER — LEVALBUTEROL HCL 1.25 MG/0.5ML IN NEBU
1.2500 mg | INHALATION_SOLUTION | Freq: Three times a day (TID) | RESPIRATORY_TRACT | Status: DC
  Filled 2014-09-23 (×4): qty 0.5

## 2014-09-23 MED ORDER — POTASSIUM CHLORIDE 10 MEQ/50ML IV SOLN
10.0000 meq | INTRAVENOUS | Status: AC
  Administered 2014-09-23 (×2): 10 meq via INTRAVENOUS
  Filled 2014-09-23 (×2): qty 50

## 2014-09-23 MED ORDER — ASPIRIN 81 MG PO CHEW
81.0000 mg | CHEWABLE_TABLET | Freq: Every day | ORAL | Status: DC
  Administered 2014-09-24 – 2014-09-25 (×2): 81 mg
  Filled 2014-09-23 (×3): qty 1

## 2014-09-23 MED ORDER — INSULIN ASPART 100 UNIT/ML ~~LOC~~ SOLN: 0.0000 [IU] | Freq: Every day | SUBCUTANEOUS | Status: DC

## 2014-09-23 MED ORDER — INSULIN ASPART 100 UNIT/ML ~~LOC~~ SOLN
0.0000 [IU] | Freq: Three times a day (TID) | SUBCUTANEOUS | Status: DC
  Administered 2014-09-23: 2 [IU] via SUBCUTANEOUS
  Administered 2014-09-23: 3 [IU] via SUBCUTANEOUS
  Administered 2014-09-23 – 2014-09-24 (×2): 2 [IU] via SUBCUTANEOUS
  Administered 2014-09-25: 3 [IU] via SUBCUTANEOUS
  Administered 2014-09-25 (×2): 2 [IU] via SUBCUTANEOUS

## 2014-09-23 MED ORDER — LEVALBUTEROL HCL 0.63 MG/3ML IN NEBU: 0.6300 mg | INHALATION_SOLUTION | Freq: Four times a day (QID) | RESPIRATORY_TRACT | Status: DC | PRN

## 2014-09-23 MED FILL — Magnesium Sulfate Inj 50%: INTRAMUSCULAR | Qty: 10 | Status: AC

## 2014-09-23 MED FILL — Potassium Chloride Inj 2 mEq/ML: INTRAVENOUS | Qty: 40 | Status: AC

## 2014-09-23 MED FILL — Heparin Sodium (Porcine) Inj 1000 Unit/ML: INTRAMUSCULAR | Qty: 30 | Status: AC

## 2014-09-23 NOTE — Progress Notes (Signed)
3 Days Post-Op Procedure(s) (LRB): CORONARY ARTERY BYPASS GRAFTING (CABG) x   three using left internal mammary artery and right leg greater saphenous vein harvested endoscopically (N/A) TRANSESOPHAGEAL ECHOCARDIOGRAM (TEE) (N/A) Subjective: Feels better today Was coughing over night and brought up a large amount of mucous  Objective: Vital signs in last 24 hours: Temp:  [97.6 F (36.4 C)-98.5 F (36.9 C)] 98.5 F (36.9 C) (04/25 0700) Pulse Rate:  [65-108] 81 (04/25 0500) Cardiac Rhythm:  [-] Normal sinus rhythm (04/25 0400) Resp:  [14-31] 21 (04/25 0700) BP: (89-113)/(37-75) 106/49 mmHg (04/25 0700) SpO2:  [92 %-100 %] 95 % (04/25 0700) Arterial Line BP: (112-250)/(4-67) 131/53 mmHg (04/25 0530) FiO2 (%):  [50 %-55 %] 50 % (04/24 2000) Weight:  [219 lb 5.7 oz (99.5 kg)] 219 lb 5.7 oz (99.5 kg) (04/25 0600)  Hemodynamic parameters for last 24 hours:    Intake/Output from previous day: 04/24 0701 - 04/25 0700 In: 2019.2 [P.O.:840; I.V.:1129.2; IV Piggyback:50] Out: 1930 [YSAYT:0160] Intake/Output this shift:    General appearance: alert, cooperative and no distress Neurologic: intact Heart: regular rate and rhythm Lungs: diminished breath sounds bibasilar Abdomen: normal findings: mildly distended, good BS Extremities: edema 1+  Lab Results:  Recent Labs  09/22/14 0400 09/23/14 0430  WBC 11.9* 11.2*  HGB 10.2* 9.6*  HCT 30.2* 28.4*  PLT 126* 142*   BMET:  Recent Labs  09/22/14 0400 09/23/14 0430  NA 132* 130*  K 3.8 3.0*  CL 95* 90*  CO2 24 28  GLUCOSE 237* 128*  BUN 17 24*  CREATININE 1.00 0.92  CALCIUM 7.5* 7.7*    PT/INR:  Recent Labs  09/20/14 1225  LABPROT 21.5*  INR 1.85*   ABG    Component Value Date/Time   PHART 7.456* 09/22/2014 1103   HCO3 25.3* 09/22/2014 1103   TCO2 26 09/22/2014 1103   ACIDBASEDEF 1.0 09/21/2014 1810   O2SAT 96.0 09/22/2014 1103   CBG (last 3)   Recent Labs  09/22/14 2003 09/22/14 2339 09/23/14 0420   GLUCAP 184* 112* 123*    Assessment/Plan: S/P Procedure(s) (LRB): CORONARY ARTERY BYPASS GRAFTING (CABG) x   three using left internal mammary artery and right leg greater saphenous vein harvested endoscopically (N/A) TRANSESOPHAGEAL ECHOCARDIOGRAM (TEE) (N/A) -  POD # 3 looks much better this AM  CV- in SR on lopressor and amiodarone  Change amiodarone to PO, continue lopressor  Since he has had so much a fib will start pradaxa to reduce risk of stroke- hopefully will only need short term- 6-8 weeks  RESP- marked improvement in oxygenation, likely a combination of diuresis and improving atelectasis  Continue symbicort, change levalbuterol to PRN  RENAL- creatinine stable, hypokalemia- supplement IV  Dc lasix drip, give IV/ PO as needed  Anemia secondary to ABL- mild, follow  Thrombocytopenia- mild, improving  ENDO- CBG control improving  Mobilize  LOS: 6 days    Christopher [name] 09/23/2014

## 2014-09-23 NOTE — Plan of Care (Signed)
Problem: Phase III - Recovery through Discharge Goal: Activity Progressed Outcome: Progressing Sits up in chair most of time per his request. Cushion added to chair. Breathes easier and helps with cough when he sits up.

## 2014-09-23 NOTE — Progress Notes (Signed)
Patient ID: Christopher [name], male   DOB: [birth year], 81 y.o.   MRN: [id number] EVENING ROUNDS NOTE :     [address]       [place] [address]             [phone]                 3 Days Post-Op Procedure(s) (LRB): CORONARY ARTERY BYPASS GRAFTING (CABG) x   three using left internal mammary artery and right leg greater saphenous vein harvested endoscopically (N/A) TRANSESOPHAGEAL ECHOCARDIOGRAM (TEE) (N/A)  Total Length of Stay:  LOS: 6 days  BP 100/62 mmHg  Pulse 83  Temp(Src) 98 F (36.7 C) (Oral)  Resp 19  Ht 5\' 11"  (1.803 m)  Wt 219 lb 5.7 oz (99.5 kg)  BMI 30.61 kg/m2  SpO2 100%  .Intake/Output      04/25 0701 - 04/26 0700   P.O. 1110   I.V. (mL/kg) 349.4 (3.5)   IV Piggyback 200   Total Intake(mL/kg) 1659.4 (16.7)   Urine (mL/kg/hr) 910 (0.7)   Stool 0 (0)   Total Output 910   Net +749.4       Stool Occurrence 2 x     . sodium chloride Stopped (09/21/14 1600)  . sodium chloride    . sodium chloride    . lactated ringers    . lactated ringers 20 mL/hr at 09/20/14 1329  . norepinephrine (LEVOPHED) Adult infusion Stopped (09/23/14 0500)     Lab Results  Component Value Date   WBC 11.2* 09/23/2014   HGB 10.2* 09/23/2014   HCT 30.0* 09/23/2014   PLT 142* 09/23/2014   GLUCOSE 103* 09/23/2014   CHOL 149 06/02/2010   TRIG 217* 06/02/2010   HDL 34* 06/02/2010   LDLCALC 72 06/02/2010   ALT 22 09/18/2014   AST 24 09/18/2014   NA 130* 09/23/2014   K 4.2 09/23/2014   CL 90* 09/23/2014   CREATININE 0.92 09/23/2014   BUN 24* 09/23/2014   CO2 28 09/23/2014   TSH 0.046* 09/18/2014   INR 1.85* 09/20/2014   HGBA1C 6.6* 09/18/2014   Walked 150 feet, improving Renal function stable  Grace Isaac MD  Beeper [phone] Office [phone] 09/23/2014 7:41 PM

## 2014-09-23 NOTE — Care Management Note (Signed)
    Page 1 of 1   09/26/2014     11:58:15 AM CARE MANAGEMENT NOTE 09/26/2014  Patient:  Christopher [name], Christopher [name]   Account Number:  [id number]  Date Initiated:  09/18/2014  Documentation initiated by:  Luz Lex  Subjective/Objective Assessment:   [place] - continued pain - on NTG and heparin drips.  For TCTS surgery consult.     Action/Plan:   Anticipated DC Date:  09/25/2014   Anticipated DC Plan:  [place]  CM consult      Choice offered to / List presented to:             Status of service:  Completed, signed off Medicare Important Message given?  YES (If response is "NO", the following Medicare IM given date fields will be blank) Date Medicare IM given:  09/23/2014 Medicare IM given by:  [facility] Date Additional Medicare IM given:  09/26/2014 Additional Medicare IM given by:  Marvetta [place]  Discharge Disposition:  HOME/SELF CARE  Per UR Regulation:  Reviewed for med. necessity/level of care/duration of stay  If discussed at [place] of Stay Meetings, dates discussed:   09/26/2014    Comments:  Contact:  [place],Mitch Son [phone]     Ozella Almond - daughter  [phone]  09-23-14 9:20am Luz Lex, RNBSN [phone] Post op CABG x3 on 4-22.  Afib post on amio drip - hope to convert to po today.  Patient sitting up in chair, daughter at bedside.  Patient HOB.  Confirmed that patient does live at home with wife who he cares for the evening - wife has alzheimers.  During the day they have caregivers - however they have four caregivers who have increased their hours and between the caregivers and son, daughter, and their kids are continuing  to provide care for wife and plan to have two people  with him on discharge to care for both him and wife 24/7.   CM will continue to follow for further needs.  09-18-14 9:30am Luz Lex, [place] Lives at home and cares for his wife who has alzheimers. CM will continue to follow  for needs.

## 2014-09-23 NOTE — Progress Notes (Addendum)
ANTICOAGULATION CONSULT NOTE - Initial Consult  Pharmacy Consult for Pradaxa Indication: atrial fibrillation  No Known Allergies  Patient Measurements: Height: 5\' 11"  (180.3 cm) Weight: 219 lb 5.7 oz (99.5 kg) IBW/kg (Calculated) : 75.3  Vital Signs: Temp: 98.5 F (36.9 C) (04/25 0700) Temp Source: Oral (04/25 0700) BP: 108/65 mmHg (04/25 0900) Pulse Rate: 86 (04/25 0900)  Labs:  Recent Labs  09/20/14 1225  09/21/14 1645 09/21/14 1700 09/22/14 0400 09/23/14 0430  HGB 12.1*  < > 10.4* 10.5* 10.2* 9.6*  HCT 34.4*  < > 30.1* 31.0* 30.2* 28.4*  PLT 124*  < > 127*  --  126* 142*  APTT 39*  --   --   --   --   --   LABPROT 21.5*  --   --   --   --   --   INR 1.85*  --   --   --   --   --   CREATININE  --   < > 0.99 0.90 1.00 0.92  < > = values in this interval not displayed.  Estimated Creatinine Clearance: 77 mL/min (by C-G formula based on Cr of 0.92).   Medical History: Past Medical History  Diagnosis Date  . Coronary artery disease     a. PCI/DES to mLAD and dRCA in 2005, residual OM1 50%; b. nuclear stress test 02/2008: no ischemia; c. nuclear stress test 11/2009: mild ischemia in the inferior wall c/w previous stress test; d. cath 09/17/2014: critical ostial/prox LAD estimated at 90-95%, critical prox LCx, RCA w/ mild diff dz, EF >55%, no WMA  . Hyperlipidemia   . Hypertension   . Carotid artery stenosis     a. RICA 40%  . GERD (gastroesophageal reflux disease)   . PVD (peripheral vascular disease)     Medications:  Scheduled:  . acetaminophen  1,000 mg Oral 4 times per day   Or  . acetaminophen (TYLENOL) oral liquid 160 mg/5 mL  1,000 mg Per Tube 4 times per day  . amiodarone  200 mg Oral BID  . antiseptic oral rinse  7 mL Mouth Rinse BID  . aspirin EC  81 mg Oral Daily   Or  . aspirin  81 mg Per Tube Daily  . atorvastatin  20 mg Oral q1800  . bisacodyl  10 mg Oral Daily   Or  . bisacodyl  10 mg Rectal Daily  . budesonide-formoterol  2 puff Inhalation  BID  . chlorpheniramine-HYDROcodone  5 mL Oral Q12H  . docusate sodium  200 mg Oral Daily  . furosemide  40 mg Intravenous Once  . insulin aspart  0-15 Units Subcutaneous TID WC  . insulin aspart  0-5 Units Subcutaneous QHS  . insulin detemir  8 Units Subcutaneous BID  . levobunolol  1 drop Both Eyes Daily  . metoprolol tartrate  12.5 mg Oral BID   Or  . metoprolol tartrate  12.5 mg Per Tube BID  . pantoprazole  40 mg Oral Daily  . potassium chloride  10 mEq Intravenous Q1 Hr x 3  . potassium chloride  10 mEq Intravenous Q1 Hr x 2  . sodium chloride  3 mL Intravenous Q12H    Assessment: 81 yo M s/p CABG 4/22.  Pt has had intermittent afib post-op, currently SR on metoprolol and amiodarone.  Pharmacy has been asked to dose Pradaxa for post-op afib.  Noted plans for therapy x 6-8 weeks.  Current Hgb 9.6, PLTC 142.  No bleeding noted.  Expected post-op anemia.   Of note, Pradaxa and amiodarone do have a drug interaction that results in increased drug levels of Pradaxa, resulting in increased bleeding risk.  Current package labeling does not require dose adjustment, however, this patient should be monitored closely while on this combination.  Goal of Therapy:  Therapeutic anticoagulation Monitor platelets by anticoagulation protocol: Yes   Plan:  Pradaxa 150mg  PO BID Monitor CBC, sx of bleeding Follow-up education prior to discharge.  [employer], Pharm.D., BCPS Clinical Pharmacist Pager [phone] 09/23/2014 9:55 AM

## 2014-09-24 ENCOUNTER — Inpatient Hospital Stay (HOSPITAL_COMMUNITY): Payer: Medicare Other

## 2014-09-24 LAB — BASIC METABOLIC PANEL
Anion gap: 12 (ref 5–15)
BUN: 27 mg/dL — ABNORMAL HIGH (ref 6–23)
CO2: 26 mmol/L (ref 19–32)
Calcium: 8.1 mg/dL — ABNORMAL LOW (ref 8.4–10.5)
Chloride: 91 mmol/L — ABNORMAL LOW (ref 96–112)
Creatinine, Ser: 0.85 mg/dL (ref 0.50–1.35)
GFR calc Af Amer: >90 mL/min (ref >90)
GFR calc non Af Amer: 80 mL/min — ABNORMAL LOW (ref >90)
Glucose, Bld: 119 mg/dL — ABNORMAL HIGH (ref 70–99)
Potassium: 3.6 mmol/L (ref 3.5–5.1)
Sodium: 129 mmol/L — ABNORMAL LOW (ref 135–145)

## 2014-09-24 LAB — CBC
HCT: 30 % — ABNORMAL LOW (ref 39.0–52.0)
Hemoglobin: 10.1 g/dL — ABNORMAL LOW (ref 13.0–17.0)
MCH: 29.4 pg (ref 26.0–34.0)
MCHC: 33.7 g/dL (ref 30.0–36.0)
MCV: 87.2 fL (ref 78.0–100.0)
Platelets: 187 10*3/uL (ref 150–400)
RBC: 3.44 MIL/uL — ABNORMAL LOW (ref 4.22–5.81)
RDW: 14.7 % (ref 11.5–15.5)
WBC: 10.1 10*3/uL (ref 4.0–10.5)

## 2014-09-24 LAB — GLUCOSE, CAPILLARY
GLUCOSE-CAPILLARY: 101 mg/dL — AB (ref 70–99)
GLUCOSE-CAPILLARY: 115 mg/dL — AB (ref 70–99)
Glucose-Capillary: 119 mg/dL — ABNORMAL HIGH (ref 70–99)
Glucose-Capillary: 124 mg/dL — ABNORMAL HIGH (ref 70–99)

## 2014-09-24 MED ORDER — SODIUM CHLORIDE 0.9 % IJ SOLN
3.0000 mL | Freq: Two times a day (BID) | INTRAMUSCULAR | Status: DC
  Administered 2014-09-24 – 2014-09-26 (×3): 3 mL via INTRAVENOUS

## 2014-09-24 MED ORDER — ALUM & MAG HYDROXIDE-SIMETH 200-200-20 MG/5ML PO SUSP: 15.0000 mL | ORAL | Status: DC | PRN

## 2014-09-24 MED ORDER — POTASSIUM CHLORIDE CRYS ER 20 MEQ PO TBCR
20.0000 meq | EXTENDED_RELEASE_TABLET | Freq: Two times a day (BID) | ORAL | Status: DC
  Administered 2014-09-24 – 2014-09-26 (×5): 20 meq via ORAL
  Filled 2014-09-24 (×8): qty 1

## 2014-09-24 MED ORDER — POTASSIUM CHLORIDE 10 MEQ/50ML IV SOLN
10.0000 meq | Freq: Once | INTRAVENOUS | Status: AC
  Administered 2014-09-24: 10 meq via INTRAVENOUS

## 2014-09-24 MED ORDER — MAGNESIUM HYDROXIDE 400 MG/5ML PO SUSP: 30.0000 mL | Freq: Every day | ORAL | Status: DC | PRN

## 2014-09-24 MED ORDER — MOVING RIGHT ALONG BOOK
Freq: Once | Status: AC
  Administered 2014-09-24: 12:00:00
  Filled 2014-09-24: qty 1

## 2014-09-24 MED ORDER — POTASSIUM CHLORIDE 10 MEQ/50ML IV SOLN
10.0000 meq | INTRAVENOUS | Status: DC
  Administered 2014-09-24 (×2): 10 meq via INTRAVENOUS
  Filled 2014-09-24: qty 50

## 2014-09-24 MED ORDER — SODIUM CHLORIDE 0.9 % IV SOLN: 250.0000 mL | INTRAVENOUS | Status: DC | PRN

## 2014-09-24 MED ORDER — SODIUM CHLORIDE 0.9 % IJ SOLN: 3.0000 mL | INTRAMUSCULAR | Status: DC | PRN

## 2014-09-24 MED ORDER — HYDROCOD POLST-CPM POLST ER 10-8 MG/5ML PO SUER
5.0000 mL | Freq: Four times a day (QID) | ORAL | Status: DC | PRN
  Administered 2014-09-24 – 2014-09-25 (×2): 5 mL via ORAL
  Filled 2014-09-24 (×2): qty 5

## 2014-09-24 MED ORDER — POTASSIUM CHLORIDE 10 MEQ/50ML IV SOLN
10.0000 meq | INTRAVENOUS | Status: DC | PRN
  Filled 2014-09-24: qty 50

## 2014-09-24 MED ORDER — FUROSEMIDE 40 MG PO TABS
40.0000 mg | ORAL_TABLET | Freq: Every day | ORAL | Status: DC
  Administered 2014-09-24 – 2014-09-26 (×3): 40 mg via ORAL
  Filled 2014-09-24 (×3): qty 1

## 2014-09-24 MED ORDER — METOPROLOL TARTRATE 25 MG PO TABS
25.0000 mg | ORAL_TABLET | Freq: Two times a day (BID) | ORAL | Status: DC
  Administered 2014-09-24 – 2014-09-26 (×5): 25 mg via ORAL
  Filled 2014-09-24 (×6): qty 1

## 2014-09-24 MED ORDER — ALPRAZOLAM 0.25 MG PO TABS: 0.2500 mg | ORAL_TABLET | Freq: Four times a day (QID) | ORAL | Status: DC | PRN

## 2014-09-24 MED ORDER — METOPROLOL TARTRATE 25 MG/10 ML ORAL SUSPENSION
25.0000 mg | Freq: Two times a day (BID) | ORAL | Status: DC
  Filled 2014-09-24 (×6): qty 10

## 2014-09-24 NOTE — Progress Notes (Signed)
Attempted to call report to 2W 

## 2014-09-24 NOTE — Progress Notes (Signed)
Report to 2 W RN 

## 2014-09-24 NOTE — Progress Notes (Signed)
CARDIAC REHAB PHASE I   PRE:  Rate/Rhythm: 99 afib  BP:  Supine: 94/70  Sitting:   Standing:    SaO2: 95% 2L but one prong out of nose  MODE:  Ambulation: 150 ft   POST:  Rate/Rhythm: 108 afib  BP:  Supine: 114/70  Sitting:   Standing:    SaO2: 94%RA 1450-1522 Pt walked 150 ft on RA with rolling walker and asst x 1 with fairly steady gait. Tired easily and got hot. To bed after walk. Left on RA and notified RN.   Graylon Good, RN BSN  09/24/2014 3:16 PM

## 2014-09-24 NOTE — Progress Notes (Signed)
Transferred to 2W 34 via WC, O2 and monitor. Pt placed in bed, tele on, SCD's with pt. NT in room to receive.

## 2014-09-24 NOTE — Progress Notes (Signed)
4 Days Post-Op Procedure(s) (LRB): CORONARY ARTERY BYPASS GRAFTING (CABG) x   three using left internal mammary artery and right leg greater saphenous vein harvested endoscopically (N/A) TRANSESOPHAGEAL ECHOCARDIOGRAM (TEE) (N/A) Subjective: C/o coughing overnight, better this AM Denies nausea  Objective: Vital signs in last 24 hours: Temp:  [98 F (36.7 C)-99.1 F (37.3 C)] 98.5 F (36.9 C) (04/26 0722) Pulse Rate:  [71-89] 89 (04/26 0600) Cardiac Rhythm:  [-] Normal sinus rhythm (04/26 0728) Resp:  [14-25] 18 (04/26 0600) BP: (80-129)/(50-79) 117/70 mmHg (04/26 0600) SpO2:  [95 %-100 %] 98 % (04/26 0600) Weight:  [221 lb 1.9 oz (100.3 kg)] 221 lb 1.9 oz (100.3 kg) (04/26 0500)  Hemodynamic parameters for last 24 hours:    Intake/Output from previous day: 04/25 0701 - 04/26 0700 In: 1889.4 [P.O.:1230; I.V.:409.4; IV Piggyback:250] Out: 1260 [Urine:1260] Intake/Output this shift:    General appearance: alert, cooperative and no distress Neurologic: intact Heart: regular rate and rhythm Lungs: diminished breath sounds bibasilar Abdomen: distended, tympanitic, nontender Wound: minimal sanguinous drainage from sternal wound  Lab Results:  Recent Labs  09/23/14 0430 09/23/14 1433 09/24/14 0500  WBC 11.2*  --  10.1  HGB 9.6* 10.2* 10.1*  HCT 28.4* 30.0* 30.0*  PLT 142*  --  187   BMET:  Recent Labs  09/23/14 0430 09/23/14 1433 09/24/14 0500  NA 130* 130* 129*  K 3.0* 4.2 3.6  CL 90*  --  91*  CO2 28  --  26  GLUCOSE 128* 103* 119*  BUN 24*  --  27*  CREATININE 0.92  --  0.85  CALCIUM 7.7*  --  8.1*    PT/INR: No results for input(s): LABPROT, INR in the last 72 hours. ABG    Component Value Date/Time   PHART 7.456* 09/22/2014 1103   HCO3 25.3* 09/22/2014 1103   TCO2 26 09/22/2014 1103   ACIDBASEDEF 1.0 09/21/2014 1810   O2SAT 96.0 09/22/2014 1103   CBG (last 3)   Recent Labs  09/23/14 1106 09/23/14 1607 09/23/14 2203  GLUCAP 158* 122* 99     Assessment/Plan: S/P Procedure(s) (LRB): CORONARY ARTERY BYPASS GRAFTING (CABG) x   three using left internal mammary artery and right leg greater saphenous vein harvested endoscopically (N/A) TRANSESOPHAGEAL ECHOCARDIOGRAM (TEE) (N/A) Plan for transfer to step-down: see transfer orders  Continues to improve CV- in SR on amiodarone and lopressor, on pradaxa fro a fib  ASA  RESP- cough persists, chest xray with some atelectasis, small effusions, no infiltrate  RENAL- creatinine normal, still volume overloaded- continue lasix  ENDO- CBG normalizing  Thrombocytopenia resolved   LOS: 7 days    Melrose Nakayama 09/24/2014

## 2014-09-25 ENCOUNTER — Inpatient Hospital Stay (HOSPITAL_COMMUNITY): Payer: Medicare Other

## 2014-09-25 LAB — GLUCOSE, CAPILLARY
Glucose-Capillary: 152 mg/dL — ABNORMAL HIGH (ref 70–99)
Glucose-Capillary: 123 mg/dL — ABNORMAL HIGH (ref 70–99)
Glucose-Capillary: 141 mg/dL — ABNORMAL HIGH (ref 70–99)
Glucose-Capillary: 152 mg/dL — ABNORMAL HIGH (ref 70–99)

## 2014-09-25 LAB — CBC
HCT: 30.9 % — ABNORMAL LOW (ref 39.0–52.0)
HEMOGLOBIN: 10.3 g/dL — AB (ref 13.0–17.0)
MCH: 29.1 pg (ref 26.0–34.0)
MCHC: 33.3 g/dL (ref 30.0–36.0)
MCV: 87.3 fL (ref 78.0–100.0)
Platelets: 246 10*3/uL (ref 150–400)
RBC: 3.54 MIL/uL — AB (ref 4.22–5.81)
RDW: 14.8 % (ref 11.5–15.5)
WBC: 8.1 10*3/uL (ref 4.0–10.5)

## 2014-09-25 LAB — BASIC METABOLIC PANEL
Anion gap: 13 (ref 5–15)
BUN: 23 mg/dL (ref 6–23)
CALCIUM: 8.1 mg/dL — AB (ref 8.4–10.5)
CO2: 24 mmol/L (ref 19–32)
Chloride: 92 mmol/L — ABNORMAL LOW (ref 96–112)
Creatinine, Ser: 0.93 mg/dL (ref 0.50–1.35)
GFR calc Af Amer: 90 mL/min — ABNORMAL LOW (ref >90)
GFR, EST NON AFRICAN AMERICAN: 77 mL/min — AB (ref >90)
Glucose, Bld: 120 mg/dL — ABNORMAL HIGH (ref 70–99)
Potassium: 4 mmol/L (ref 3.5–5.1)
Sodium: 129 mmol/L — ABNORMAL LOW (ref 135–145)

## 2014-09-25 MED ORDER — DABIGATRAN ETEXILATE MESYLATE 150 MG PO CAPS
150.0000 mg | ORAL_CAPSULE | Freq: Two times a day (BID) | ORAL | Status: DC
  Administered 2014-09-25 – 2014-09-26 (×2): 150 mg via ORAL
  Filled 2014-09-25 (×3): qty 1

## 2014-09-25 MED ORDER — ENSURE ENLIVE PO LIQD
237.0000 mL | Freq: Every day | ORAL | Status: DC
  Administered 2014-09-25: 237 mL via ORAL

## 2014-09-25 NOTE — Progress Notes (Signed)
CARDIAC REHAB PHASE I   PRE:  Rate/Rhythm: 86 SR  BP:  Supine: 110/60  Sitting:   Standing:    SaO2: 92-93%RA  MODE:  Ambulation: 340 ft   POST:  Rate/Rhythm: 92  BP:  Supine:   Sitting: 146/68  Standing:    SaO2: 95%RA 1055-1115 Pt walked 340 ft on RA with rolling walker and minimal asst. Needs encouragement need to use arms when sitting. Tolerated well. To recliner with call bell.     Graylon Good, RN BSN  09/25/2014 11:12 AM

## 2014-09-25 NOTE — Discharge Instructions (Addendum)
Coronary Artery Bypass Grafting, Care After Refer to this sheet in the next few weeks. These instructions provide you with information on caring for yourself after your procedure. Your health care provider may also give you more specific instructions. Your treatment has been planned according to current medical practices, but problems sometimes occur. Call your health care provider if you have any problems or questions after your procedure. WHAT TO EXPECT AFTER THE PROCEDURE Recovery from surgery will be different for everyone. Some people feel well after 3 or 4 weeks, while for others it takes longer. After your procedure, it is typical to have the following:  Nausea and a lack of appetite.   Constipation.  Weakness and fatigue.   Depression or irritability.   Pain or discomfort at your incision site. HOME CARE INSTRUCTIONS  Take medicines only as directed by your health care provider. Do not stop taking medicines or start any new medicines without first checking with your health care provider.  Take your pulse as directed by your health care provider.  Perform deep breathing as directed by your health care provider. If you were given a device called an incentive spirometer, use it to practice deep breathing several times a day. Support your chest with a pillow or your arms when you take deep breaths or cough.  Keep incision areas clean, dry, and protected. Remove or change any bandages (dressings) only as directed by your health care provider. You may have skin adhesive strips over the incision areas. Do not take the strips off. They will fall off on their own.  Check incision areas daily for any swelling, redness, or drainage.  If incisions were made in your legs, do the following:  Avoid crossing your legs.   Avoid sitting for long periods of time. Change positions every 30 minutes.   Elevate your legs when you are sitting.  Wear compression stockings as directed by your  health care provider. These stockings help keep blood clots from forming in your legs.  Take showers once your health care provider approves. Until then, only take sponge baths. Pat incisions dry. Do not rub incisions with a washcloth or towel. Do not take baths, swim, or use a hot tub until your health care provider approves.  Eat foods that are high in fiber, such as raw fruits and vegetables, whole grains, beans, and nuts. Meats should be lean cut. Avoid canned, processed, and fried foods.  Drink enough fluid to keep your urine clear or pale yellow.  Weigh yourself every day. This helps identify if you are retaining fluid that may make your heart and lungs work harder.  Rest and limit activity as directed by your health care provider. You may be instructed to:  Stop any activity at once if you have chest pain, shortness of breath, irregular heartbeats, or dizziness. Get help right away if you have any of these symptoms.  Move around frequently for short periods or take short walks as directed by your health care provider. Increase your activities gradually. You may need physical therapy or cardiac rehabilitation to help strengthen your muscles and build your endurance.  Avoid lifting, pushing, or pulling anything heavier than 10 lb (4.5 kg) for at least 6 weeks after surgery.  Do not drive until your health care provider approves.  Ask your health care provider when you may return to work.  Ask your health care provider when you may resume sexual activity.  Keep all follow-up visits as directed by your health care  provider. This is important. °SEEK MEDICAL CARE IF: °· You have swelling, redness, increasing pain, or drainage at the site of an incision. °· You have a fever. °· You have swelling in your ankles or legs. °· You have pain in your legs.   °· You gain 2 or more pounds (0.9 kg) a day. °· You are nauseous or vomit. °· You have diarrhea.  °SEEK IMMEDIATE MEDICAL CARE IF: °· You have  chest pain that goes to your jaw or arms. °· You have shortness of breath.   °· You have a fast or irregular heartbeat.   °· You notice a "clicking" in your breastbone (sternum) when you move.   °· You have numbness or weakness in your arms or legs. °· You feel dizzy or light-headed.   °MAKE SURE YOU: °· Understand these instructions. °· Will watch your condition. °· Will get help right away if you are not doing well or get worse. °Document Released: 12/04/2004 Document Revised: 10/01/2013 Document Reviewed: 10/24/2012 °[employer]® Patient Information ©2015 [employer]. This information is not intended to replace advice given to you by your health care provider. Make sure you discuss any questions you have with your health care provider. ° °Endoscopic Saphenous Vein Harvesting °Care After °Refer to this sheet in the next few weeks. These instructions provide you with information on caring for yourself after your procedure. Your health care provider may also give you more specific instructions. Your treatment has been planned according to current medical practices, but problems sometimes occur. Call your health care provider if you have any problems or questions after your procedure. °HOME CARE INSTRUCTIONS °Medicine °· Take whatever pain medicine your surgeon prescribes. Follow the directions carefully. Do not take over-the-counter pain medicine unless your surgeon says it is okay. Some pain medicine can cause bleeding problems for several weeks after surgery. °· Follow your surgeon's instructions about driving. You will probably not be permitted to drive after heart surgery. °· Take any medicines your surgeon prescribes. Any medicines you took before your heart surgery should be checked with your health care provider before you start taking them again. °Wound care °· If your surgeon has prescribed an elastic bandage or stocking, ask how long you should wear it. °· Check the area around your surgical cuts  (incisions) whenever your bandages (dressings) are changed. Look for any redness or swelling. °· You will need to return to have the stitches (sutures) or staples taken out. Ask your surgeon when to do that. °· Ask your surgeon when you can shower or bathe. °Activity °· Try to keep your legs raised when you are sitting. °· Do any exercises your health care providers have given you. These may include deep breathing exercises, coughing, walking, or other exercises. °SEEK MEDICAL CARE IF: °· You have any questions about your medicines. °· You have more leg pain, especially if your pain medicine stops working. °· New or growing bruises develop on your leg. °· Your leg swells, feels tight, or becomes red. °· You have numbness in your leg. °SEEK IMMEDIATE MEDICAL CARE IF: °· Your pain gets much worse. °· Blood or fluid leaks from any of the incisions. °· Your incisions become warm, swollen, or red. °· You have chest pain. °· You have trouble breathing. °· You have a fever. °· You have more pain near your leg incision. °MAKE SURE YOU: °· Understand these instructions. °· Will watch your condition. °· Will get help right away if you are not doing well or get worse. °Document Released: 01/27/2011   Document Revised: 05/22/2013 Document Reviewed: 01/27/2011 [facility] Patient Information 2015 [place], [place]. This information is not intended to replace advice given to you by your health care provider. Make sure you discuss any questions you have with your health care provider.  Information on my medicine - Pradaxa (dabigatran)  This medication education was reviewed with me or my healthcare representative as part of my discharge preparation.  The pharmacist that spoke with me during my hospital stay was:  Jaquita Folds, [facility]  Why was Pradaxa prescribed for you? Pradaxa was prescribed for you to reduce the risk of forming blood clots that cause a stroke if you have a medical condition called atrial fibrillation (a type  of irregular heartbeat).    What do you Need to know about PradAXa? Take your Pradaxa TWICE DAILY - one capsule in the morning and one tablet in the evening with or without food.  It would be best to take the doses about the same time each day.  The capsules should not be broken, chewed or opened - they must be swallowed whole.  Do not store Pradaxa in other medication containers - once the bottle is opened the Pradaxa should be used within FOUR months; throw away any capsules that havent been by that time.  Take Pradaxa exactly as prescribed by your doctor.  DO NOT stop taking Pradaxa without talking to the doctor who prescribed the medication.  Stopping without other stroke prevention medication to take the place of Pradaxa may increase your risk of developing a clot that causes a stroke.  Refill your prescription before you run out.  After discharge, you should have regular check-up appointments with your healthcare provider that is prescribing your Pradaxa.  In the future your dose may need to be changed if your kidney function or weight changes by a significant amount.  What do you do if you miss a dose? If you miss a dose, take it as soon as you remember on the same day.  If your next dose is less than 6 hours away, skip the missed dose.  Do not take two doses of PRADAXA at the same time.  Important Safety Information A possible side effect of Pradaxa is bleeding. You should call your healthcare provider right away if you experience any of the following: ? Bleeding from an injury or your nose that does not stop. ? Unusual colored urine (red or dark brown) or unusual colored stools (red or black). ? Unusual bruising for unknown reasons. ? A serious fall or if you hit your head (even if there is no bleeding).  Some medicines may interact with Pradaxa and might increase your risk of bleeding or clotting while on Pradaxa. To help avoid this, consult your healthcare provider or  pharmacist prior to using any new prescription or non-prescription medications, including herbals, vitamins, non-steroidal anti-inflammatory drugs (NSAIDs) and supplements.  This website has more information on Pradaxa (dabigatran): [email or web address]://www.pradaxa.com   Diabetes Mellitus and Food It is important for you to manage your blood sugar (glucose) level. Your blood glucose level can be greatly affected by what you eat. Eating healthier foods in the appropriate amounts throughout the day at about the same time each day will help you control your blood glucose level. It can also help slow or prevent worsening of your diabetes mellitus. Healthy eating may even help you improve the level of your blood pressure and reach or maintain a healthy weight.  HOW CAN FOOD AFFECT ME? Carbohydrates Carbohydrates affect your  blood glucose level more than any other type of food. Your dietitian will help you determine how many carbohydrates to eat at each meal and teach you how to count carbohydrates. Counting carbohydrates is important to keep your blood glucose at a healthy level, especially if you are using insulin or taking certain medicines for diabetes mellitus. Alcohol Alcohol can cause sudden decreases in blood glucose (hypoglycemia), especially if you use insulin or take certain medicines for diabetes mellitus. Hypoglycemia can be a life-threatening condition. Symptoms of hypoglycemia (sleepiness, dizziness, and disorientation) are similar to symptoms of having too much alcohol.  If your health care provider has given you approval to drink alcohol, do so in moderation and use the following guidelines:  Women should not have more than one drink per day, and men should not have more than two drinks per day. One drink is equal to:  12 oz of beer.  5 oz of wine.  1 oz of hard liquor.  Do not drink on an empty stomach.  Keep yourself hydrated. Have water, diet soda, or unsweetened iced tea.  Regular soda,  juice, and other mixers might contain a lot of carbohydrates and should be counted. WHAT FOODS ARE NOT RECOMMENDED? As you make food choices, it is important to remember that all foods are not the same. Some foods have fewer nutrients per serving than other foods, even though they might have the same number of calories or carbohydrates. It is difficult to get your body what it needs when you eat foods with fewer nutrients. Examples of foods that you should avoid that are high in calories and carbohydrates but low in nutrients include:  Trans fats (most processed foods list trans fats on the Nutrition Facts label).  Regular soda.  Juice.  Candy.  Sweets, such as cake, pie, doughnuts, and cookies.  Fried foods. WHAT FOODS CAN I EAT? Have nutrient-rich foods, which will nourish your body and keep you healthy. The food you should eat also will depend on several factors, including:  The calories you need.  The medicines you take.  Your weight.  Your blood glucose level.  Your blood pressure level.  Your cholesterol level. You also should eat a variety of foods, including:  Protein, such as meat, poultry, fish, tofu, nuts, and seeds (lean animal proteins are best).  Fruits.  Vegetables.  Dairy products, such as milk, cheese, and yogurt (low fat is best).  Breads, grains, pasta, cereal, rice, and beans.  Fats such as olive oil, trans fat-free margarine, canola oil, avocado, and olives. DOES EVERYONE WITH DIABETES MELLITUS HAVE THE SAME MEAL PLAN? Because every person with diabetes mellitus is different, there is not one meal plan that works for everyone. It is very important that you meet with a dietitian who will help you create a meal plan that is just right for you. Document Released: 02/11/2005 Document Revised: 05/22/2013 Document Reviewed: 04/13/2013 [facility] Patient Information 2015 [place], [place]. This information is not intended to replace advice given to you by your  health care provider. Make sure you discuss any questions you have with your health care provider.

## 2014-09-25 NOTE — Discharge Summary (Signed)
Physician Discharge Summary  Patient ID: Christopher [name] MRN: [id number] DOB/AGE: 79-Oct-1933 79 y.o.  Admit date: 09/17/2014 Discharge date: 09/26/2014  Admission Diagnoses:  Patient Active Problem List   Diagnosis Date Noted  . NSTEMI (non-ST elevated myocardial infarction) 09/19/2014  . Unstable angina 09/17/2014  . Borderline diabetes 06/12/2014  . Hepatotoxicity due to statin drug 11/02/2012  . Dizziness 05/02/2012  . PVD (peripheral vascular disease) 05/19/2011  . Coronary atherosclerosis 12/18/2009  . CHEST PAIN UNSPECIFIED 12/18/2009  . Mixed hyperlipidemia 06/19/2009  . OTHER ABNORMAL CLINICAL FINDING 06/19/2009   Discharge Diagnoses:   Patient Active Problem List   Diagnosis Date Noted  . S/P CABG x 3 09/20/2014  . Atrial fibrillation with RVR 09/19/2014  . NSTEMI (non-ST elevated myocardial infarction) 09/19/2014  . Unstable angina 09/17/2014  . Borderline diabetes 06/12/2014  . Hepatotoxicity due to statin drug 11/02/2012  . Dizziness 05/02/2012  . PVD (peripheral vascular disease) 05/19/2011  . Coronary atherosclerosis 12/18/2009  . CHEST PAIN UNSPECIFIED 12/18/2009  . Mixed hyperlipidemia 06/19/2009  . OTHER ABNORMAL CLINICAL FINDING 06/19/2009  Pulmonary edema Acute respiratory failure  Discharged Condition: good  History of Present Illness:  Christopher [name] is an 81 yo with known history of CAD.  He S/P PCI with DES placement to his LAD and RCA for unstable angina.  The patient was in his usual state of health until a few days prior to presentation.  The patient was performing yard work and developed some chest pain.  This was relieved with rest.  He again had an episode of 4/18 that occurred while he was loading a truck.  This episode was more severe and did not relieve with rest.  Therefore he went the ED at [facility].  Work up showed an unremarkable EKG and negative troponin levels.  Due to his previous history cardiac catheterization was indicated.  This was performed  on 09/17/2014 and showed mutli-vessel CAD with a preserved EF.  It was felt coronary bypass grafting would be indicated the patient was transferred to [facility] for further care.      Hospital Course:   Upon arrival to [facility] consult was obtained.  The patient was evaluated by Dr. [name] who was in agreement he should undergo Coronary Bypass procedure.  The risks and benefits of the procedure were explained to the patient and he was agreeable to proceed.  He had episodes of chest pain with Atrial Fibrillation which initially responded to treatment.  However during this he was noted to have ST changes and his Troponin levels were positive.  He was ruled in for NSTEMI at that point. A NTG drip was started and his chest pain resolved.  The evening before his surgery was scheduled he had multiple episodes of Atrial Fibrillation despite an amiodarone drip. He was hypotensive with the atrial fibrillation. He developed pulmonary edema and acute respiratory failure.  He was taken to the operating room on 09/20/2014.  He underwent CABG x 3 utilizing LIMA to LAD, SVG to OM, and SVG to PDA.  He also underwent Endoscopic harvest of his greater saphenous vein from his right thigh.  He tolerated the procedure without difficulty and was taken to the SICU in stable condition.  During his stay in the SICU he was weaned off pressure support as tolerated.  He was weaned and extubated on POD #1.  He was maintaining NSR and his IV amiodarone was transitioned to an oral regimen.  He was also started on Pradaxa due  to the multiple episodes of Atrial Fibrillation.  His chest tubes and arterial lines were removed without difficulty.  He was hypervolemic and responded well to IV Lasix.  He was ambulating in the SICU without difficulty and was felt medically stable for transfer to the telemetry unit on POD #4.    The patient continues to progress.  He continues to maintain NSR and his pacing wires were removed  without difficulty.  He suffered from an Ileus which has slowly improved.  His preoperative Hgb A1c was 6.6.  He is considered a borderline diabetic and this should improve with diet and exercise however he will need close follow up with his PCP.  He is tolerating a heart healthy diet.  He continues to ambulate without difficulty.  Should he continue to progress he is felt to medically stable for discharge 09/26/2014.  Significant Diagnostic Studies: Report Indicates: critical proximal LCx and ostial/proximal LAD disease, estimated at 90 to 95%. RCA with mild diffuse disease. Normal EF estimated at >55%, no WMA.   Treatments: surgery:   Coronary artery bypass grafting x3 (LIMA to LAD, saphenous vein graft to obtuse marginal, saphenous vein graft to posterior descending), endoscopic vein harvest, right thigh.  Disposition: Home  Discharge medications:  The patient has been discharged on:   1.Beta Blocker:  Yes [ x  ]                              No   [   ]                              If No, reason:  2.Ace Inhibitor/ARB: Yes [   ]                                     No  [ x   ]                                     If No, reason: Labile blood pressure  3.Statin:   Yes [x   ]                  No  [   ]                  If No, reason:  4.Ecasa:  Yes  [ x  ]                  No   [   ]                  If No, reason:    Medication List    STOP taking these medications        amLODipine-benazepril 10-20 MG per capsule  Commonly known as:  LOTREL     clopidogrel 75 MG tablet  Commonly known as:  PLAVIX      TAKE these medications        amiodarone 200 MG tablet  Commonly known as:  PACERONE  Take 1 tablet (200 mg total) by mouth 2 (two) times daily. For 10 days, then decrease to 200 mg daily     aspirin 81 MG EC tablet  Take 81 mg  by mouth daily.     atorvastatin 20 MG tablet  Commonly known as:  LIPITOR  Take 20 mg by mouth daily.     dabigatran 150 MG Caps capsule   Commonly known as:  PRADAXA  Take 1 capsule (150 mg total) by mouth every 12 (twelve) hours.     Fish Oil 1200 MG Caps  Take 1,200 mg by mouth daily.     furosemide 40 MG tablet  Commonly known as:  LASIX  Take 1 tablet (40 mg total) by mouth daily. For 7 Days     levobunolol 0.5 % ophthalmic solution  Commonly known as:  BETAGAN  Place 1 drop into both eyes every morning.     metoprolol tartrate 25 MG tablet  Commonly known as:  LOPRESSOR  Take 1 tablet (25 mg total) by mouth 2 (two) times daily.     omeprazole 20 MG capsule  Commonly known as:  PRILOSEC  TAKE 1 CAPSULE BY MOUTH 2 TIMES DAILY.     oxyCODONE 5 MG immediate release tablet  Commonly known as:  Oxy IR/ROXICODONE  Take 1-2 tablets (5-10 mg total) by mouth every 3 (three) hours as needed for severe pain.     potassium chloride SA 20 MEQ tablet  Commonly known as:  K-DUR,KLOR-CON  Take 1 tablet (20 mEq total) by mouth 2 (two) times daily. For 7 Days           Follow-up Information    Follow up with Melrose Nakayama, MD On 10/29/2014.   Specialty:  Cardiothoracic Surgery   Why:  Appointment is at 1:30   Contact information:   [place] [address] [phone]       Follow up with [facility] On 10/29/2014.   Why:  Please get CXR at 1:00   Contact information:   [facility]       Follow up with Kathlyn Sacramento, MD On 10/18/2014.   Specialty:  Cardiology   Why:  Appointment is at 2:15   Contact information:   [place] [address] [phone]       Follow up with Adrian Prows, MD.   Specialty:  Infectious Diseases   Why:  Please set up follow for evaluation of A1c of 6.6   Contact information:   [place] [id number] [phone]       Signed: [name] 09/26/2014, 9:18 AM

## 2014-09-25 NOTE — Progress Notes (Signed)
Epicardial pacing wires removed per protocol without difficulty. VSS. Pt on bedrest for one hour. Pt and family educated.

## 2014-09-25 NOTE — Progress Notes (Signed)
5 Days Post-Op Procedure(s) (LRB): CORONARY ARTERY BYPASS GRAFTING (CABG) x   three using left internal mammary artery and right leg greater saphenous vein harvested endoscopically (N/A) TRANSESOPHAGEAL ECHOCARDIOGRAM (TEE) (N/A) Subjective: Feels better today, was able to eat a little breakfast Lots of flatus  Objective: Vital signs in last 24 hours: Temp:  [97.5 F (36.4 C)-97.9 F (36.6 C)] 97.9 F (36.6 C) (04/27 0501) Pulse Rate:  [83-108] 84 (04/27 0501) Cardiac Rhythm:  [-] Normal sinus rhythm (04/26 2036) Resp:  [14-18] 18 (04/27 0501) BP: (114-132)/(53-79) 132/68 mmHg (04/27 0501) SpO2:  [95 %-100 %] 95 % (04/27 0501) Weight:  [217 lb 13 oz (98.8 kg)] 217 lb 13 oz (98.8 kg) (04/27 0501)  Hemodynamic parameters for last 24 hours:    Intake/Output from previous day: 04/26 0701 - 04/27 0700 In: 940 [P.O.:840; IV Piggyback:100] Out: 1825 [Urine:1825] Intake/Output this shift:    General appearance: alert and no distress Neurologic: intact Heart: regular rate and rhythm Lungs: clear to auscultation bilaterally Abdomen: less distended Wound: minimal drainage superior aspect of sternal wound  Lab Results:  Recent Labs  09/24/14 0500 09/25/14 0510  WBC 10.1 8.1  HGB 10.1* 10.3*  HCT 30.0* 30.9*  PLT 187 246   BMET:  Recent Labs  09/24/14 0500 09/25/14 0510  NA 129* 129*  K 3.6 4.0  CL 91* 92*  CO2 26 24  GLUCOSE 119* 120*  BUN 27* 23  CREATININE 0.85 0.93  CALCIUM 8.1* 8.1*    PT/INR: No results for input(s): LABPROT, INR in the last 72 hours. ABG    Component Value Date/Time   PHART 7.456* 09/22/2014 1103   HCO3 25.3* 09/22/2014 1103   TCO2 26 09/22/2014 1103   ACIDBASEDEF 1.0 09/21/2014 1810   O2SAT 96.0 09/22/2014 1103   CBG (last 3)   Recent Labs  09/24/14 1559 09/24/14 2158 09/25/14 0614  GLUCAP 101* 115* 152*    Assessment/Plan: S/P Procedure(s) (LRB): CORONARY ARTERY BYPASS GRAFTING (CABG) x   three using left internal  mammary artery and right leg greater saphenous vein harvested endoscopically (N/A) TRANSESOPHAGEAL ECHOCARDIOGRAM (TEE) (N/A) -  CV- in SR on amiodarone and lopressor  pharmacy recommended pradaxa 150 mg BID  Will dc EPW this AM, start pradaxa this evening  RESP- still has a cough, but improving  Chest xray shows significant improvement in left lower lobe atelectasis  RENAL- creatinine OK  ENDO- CBG OK  Gi- ileus improved  Continue cardiac rehab  If continues to progress may be ready for dc in AM   LOS: 8 days    Melrose Nakayama 09/25/2014

## 2014-09-25 NOTE — Progress Notes (Addendum)
NUTRITION FOLLOW UP  INTERVENTION: Ensure Enlive po daily, each supplement provides 350 kcal and 20 grams of protein RD to follow for nutrition care plan  NUTRITION DIAGNOSIS: Inadequate oral intake now related to decreased appetite as evidenced by pt report, ongoing  New Goal: Pt to meet >/= 90% of their estimated nutrition needs, progressing  Monitor:  PO & supplemental intake, weight, labs, I/O's  ASSESSMENT: Patient with PMH of CAD, HTN, GERD; presented for surgical intervention with diagnosis of CAD.  Patient s/p procedure 4/22: CORONARY ARTERY BYPASS GRAFTING (CABG) x 3   Patient extubated 4/23.  Transferred to 2W-Cardiac from SICU 4/26.  Currently on a Carbohydrate Modified diet with PO intake 50% per flowsheet records.  Reports a decreased appetite.  Amenable to trying Ensure Enlive oral nutrition supplement.  RD to order.  Height: Ht Readings from Last 1 Encounters:  09/17/14 5\' 11"  (1.803 m)    Weight: Wt Readings from Last 1 Encounters:  09/25/14 217 lb 13 oz (98.8 kg)    BMI:  Body mass index is 30.39 kg/(m^2).  Re-estimated Nutritional Needs: Kcal: 2000-2200 Protein: 115-125 gm Fluid: 2.0-2.2 L  Skin: surgical chest incision   Diet Order: Carbohydrate Modified    Intake/Output Summary (Last 24 hours) at 09/25/14 1019 Last data filed at 09/25/14 0300  Gross per 24 hour  Intake    600 ml  Output   1450 ml  Net   -850 ml    Labs:   Recent Labs Lab 09/20/14 1745  09/21/14 0400 09/21/14 1645  09/23/14 0430 09/23/14 1433 09/24/14 0500 09/25/14 0510  NA  --   < > 134*  --   < > 130* 130* 129* 129*  K  --   < > 3.8  --   < > 3.0* 4.2 3.6 4.0  CL  --   < > 101  --   < > 90*  --  91* 92*  CO2  --   --  25  --   < > 28  --  26 24  BUN  --   < > 21  --   < > 24*  --  27* 23  CREATININE 1.02  < > 0.92 0.99  < > 0.92  --  0.85 0.93  CALCIUM  --   --  8.0*  --   < > 7.7*  --  8.1* 8.1*  MG 2.9*  --  2.4 2.2  --   --   --   --   --   GLUCOSE  --    < > 122*  --   < > 128* 103* 119* 120*  < > = values in this interval not displayed.  Scheduled Meds: . acetaminophen  1,000 mg Oral 4 times per day   Or  . acetaminophen (TYLENOL) oral liquid 160 mg/5 mL  1,000 mg Per Tube 4 times per day  . amiodarone  200 mg Oral BID  . aspirin EC  81 mg Oral Daily   Or  . aspirin  81 mg Per Tube Daily  . atorvastatin  20 mg Oral q1800  . bisacodyl  10 mg Oral Daily   Or  . bisacodyl  10 mg Rectal Daily  . dabigatran  150 mg Oral Q12H  . docusate sodium  200 mg Oral Daily  . furosemide  40 mg Oral Daily  . insulin aspart  0-15 Units Subcutaneous TID WC  . insulin aspart  0-5 Units Subcutaneous QHS  .  levobunolol  1 drop Both Eyes Daily  . metoprolol tartrate  25 mg Oral BID   Or  . metoprolol tartrate  25 mg Per Tube BID  . pantoprazole  40 mg Oral Daily  . potassium chloride  20 mEq Oral BID  . sodium chloride  3 mL Intravenous Q12H    Continuous Infusions:    Past Medical History  Diagnosis Date  . Coronary artery disease     a. PCI/DES to mLAD and dRCA in 2005, residual OM1 50%; b. nuclear stress test 02/2008: no ischemia; c. nuclear stress test 11/2009: mild ischemia in the inferior wall c/w previous stress test; d. cath 09/17/2014: critical ostial/prox LAD estimated at 90-95%, critical prox LCx, RCA w/ mild diff dz, EF >55%, no WMA  . Hyperlipidemia   . Hypertension   . Carotid artery stenosis     a. RICA 40%  . GERD (gastroesophageal reflux disease)   . PVD (peripheral vascular disease)     Past Surgical History  Procedure Laterality Date  . Cardiac catheterization    . Total knee arthroplasty      bilateral  . Cholecystectomy  2016  . Coronary artery bypass graft N/A 09/20/2014    Procedure: CORONARY ARTERY BYPASS GRAFTING (CABG) x   three using left internal mammary artery and right leg greater saphenous vein harvested endoscopically;  Surgeon: Melrose Nakayama, MD;  Location: [place];  Service: Open Heart Surgery;   Laterality: N/A;  . Tee without cardioversion N/A 09/20/2014    Procedure: TRANSESOPHAGEAL ECHOCARDIOGRAM (TEE);  Surgeon: Melrose Nakayama, MD;  Location: [place];  Service: Open Heart Surgery;  Laterality: N/A;    Arthur Holms, RD, LDN Pager #: [phone] After-Hours Pager #: [phone]

## 2014-09-26 MED ORDER — DABIGATRAN ETEXILATE MESYLATE 150 MG PO CAPS: 150.0000 mg | ORAL_CAPSULE | Freq: Two times a day (BID) | ORAL | Status: DC

## 2014-09-26 MED ORDER — METOPROLOL TARTRATE 25 MG PO TABS
25.0000 mg | ORAL_TABLET | Freq: Two times a day (BID) | ORAL | Status: DC
Start: 2014-09-26 — End: 2017-09-09

## 2014-09-26 MED ORDER — FUROSEMIDE 40 MG PO TABS: 40.0000 mg | ORAL_TABLET | Freq: Every day | ORAL | Status: DC

## 2014-09-26 MED ORDER — POTASSIUM CHLORIDE CRYS ER 20 MEQ PO TBCR: 20.0000 meq | EXTENDED_RELEASE_TABLET | Freq: Two times a day (BID) | ORAL | Status: DC

## 2014-09-26 MED ORDER — AMIODARONE HCL 200 MG PO TABS: 200.0000 mg | ORAL_TABLET | Freq: Two times a day (BID) | ORAL | Status: DC

## 2014-09-26 MED ORDER — OXYCODONE HCL 5 MG PO TABS: 5.0000 mg | ORAL_TABLET | ORAL | Status: DC | PRN

## 2014-09-26 NOTE — Progress Notes (Signed)
Pt discharging via wheelchair, escorted by volunteer, accompanied by family (daughter). Discharge instructions provided by Lannette Donath RN to patient and family (daughter), verbalize understanding and able to provide appropriate teach back. PIV discontinued, site without s/s of complication. Tele box (#34) removed and returned to unit. Denies needs or questions at this time.

## 2014-09-26 NOTE — Progress Notes (Addendum)
      Christopher [name] [address]       [place] [address]             [phone]      6 Days Post-Op Procedure(s) (LRB): CORONARY ARTERY BYPASS GRAFTING (CABG) x   three using left internal mammary artery and right leg greater saphenous vein harvested endoscopically (N/A) TRANSESOPHAGEAL ECHOCARDIOGRAM (TEE) (N/A)   Subjective:  Christopher [name] states he is ready to go home today.  He does have some head congestion this morning.  + ambulation +BM  Objective: Vital signs in last 24 hours: Temp:  [97.9 F (36.6 C)-98.2 F (36.8 C)] 98 F (36.7 C) (04/28 0517) Pulse Rate:  [76-88] 79 (04/28 0517) Cardiac Rhythm:  [-] Normal sinus rhythm (04/27 2100) Resp:  [20] 20 (04/28 0517) BP: (117-132)/(56-65) 121/57 mmHg (04/28 0517) SpO2:  [94 %-99 %] 94 % (04/28 0517) Weight:  [216 lb 9.6 oz (98.249 kg)] 216 lb 9.6 oz (98.249 kg) (04/28 0517)  Intake/Output from previous day: 04/27 0701 - 04/28 0700 In: 600 [P.O.:600] Out: 600 [Urine:600]  General appearance: alert, cooperative and no distress Heart: regular rate and rhythm Lungs: clear to auscultation bilaterally Abdomen: soft, non-tender; bowel sounds normal; no masses,  no organomegaly Extremities: edema trace Wound: clean and dry  Lab Results:  Recent Labs  09/24/14 0500 09/25/14 0510  WBC 10.1 8.1  HGB 10.1* 10.3*  HCT 30.0* 30.9*  PLT 187 246   BMET:  Recent Labs  09/24/14 0500 09/25/14 0510  NA 129* 129*  K 3.6 4.0  CL 91* 92*  CO2 26 24  GLUCOSE 119* 120*  BUN 27* 23  CREATININE 0.85 0.93  CALCIUM 8.1* 8.1*    PT/INR: No results for input(s): LABPROT, INR in the last 72 hours. ABG    Component Value Date/Time   PHART 7.456* 09/22/2014 1103   HCO3 25.3* 09/22/2014 1103   TCO2 26 09/22/2014 1103   ACIDBASEDEF 1.0 09/21/2014 1810   O2SAT 96.0 09/22/2014 1103   CBG (last 3)   Recent Labs  09/25/14 1130 09/25/14 1627 09/25/14 2139  GLUCAP 123* 141* 152*    Assessment/Plan: S/P Procedure(s)  (LRB): CORONARY ARTERY BYPASS GRAFTING (CABG) x   three using left internal mammary artery and right leg greater saphenous vein harvested endoscopically (N/A) TRANSESOPHAGEAL ECHOCARDIOGRAM (TEE) (N/A)  1. CV- Previous A. Fib, currently NSR- will continue Pradaxa, Amidoarone, Lopressor 2. Pulm- no acute issues, continue IS 3. Renal- remains hypervolemic, continue Lasix 4. GI- Ileus resolving, BM last night 5. DM- Hgb A1c is 6.6, will give diet information, close follow up with PCP recommended 6. Dispo- patient stable will d/c home today   LOS: 9 days    [name] 09/26/2014  Looks great. Home today  [name]. [name], MD [facility] [phone]

## 2014-09-26 NOTE — Progress Notes (Signed)
Ed completed with pt and daughter. Voiced understanding. Pt received RW from [employer] less than 5 yrs ago but gave it away. Walked with pt 150 ft without RW and he was steady, does not need it. They might get RW from church member just in case. Pt not interested in CRPII due to caring for his wife but will ex on his own. [phone] Yves Dill CES, ACSM 10:05 AM 09/26/2014

## 2014-09-26 NOTE — Progress Notes (Signed)
Pt discharge education and instructions completed with pt and daughter at bedside; all voices understanding and denies any questions. Pt IV and telemetry removed; pt incision clean, dry and intact with no active drainage or discharge noted. Pt handed his prescriptions for amiodarone, dabigatran, lasix, lopressor and oxycodone. Pt also notified to pick up potassium which was electronically sent from preferred pharmacy on file. Pt discharge home with daughter to transport him home. Pt transported off unit via wheelchair with daughter and belongings to the side. Francis Gaines Kyrollos Cordell RN.

## 2014-09-29 NOTE — Consult Note (Signed)
Details:   - GI Note:  No pain overnight.   T bili and liver enzymes till elevated, no downawrd trend, AST,ALT trending up.    a//p: -ERCP tomorrow afternoon for possible choledocholithiasis.  - npo after mn - Please do NOT give heparin or lovenox in am - possible CCY on thursday.   Electronic Signatures: Arther Dames (MD)  (Signed 09-Feb-16 17:09)  Authored: Details   Last Updated: 09-Feb-16 17:09 by Arther Dames (MD)

## 2014-09-29 NOTE — Consult Note (Signed)
PATIENT NAME:  Christopher [name], Christopher [name] MR#:  [id number] DATE OF BIRTH:  [birth year]  DATE OF CONSULTATION:  07/05/2014  REFERRING PHYSICIAN:  Belia Heman. Verdell Carmine, MD  CONSULTING PHYSICIAN:  Arther Dames, MD  REASON FOR CONSULTATION: Gallstones, elevated liver enzymes.   HISTORY OF PRESENT ILLNESS: Mr. Duffus is an 81 year old male with a past medical history notable for coronary artery disease, status post stenting currently on Plavix, who presented to the Emergency Room for evaluation of epigastric pain. He reports intermittent episodes of pain for the past 2 weeks, but the day prior to admission, the pain was relatively severe. It was in the epigastric region. It was not associated with nausea or vomiting. He did have some episodes of chills in the past day or 2, but did not take his temperature.   He has had other admissions for elevated liver enzymes in the setting of gallstones. However, he wanted to hold off on cholecystectomy due to multiple other issues.   Currently, he feels improved because of receiving IV morphine. He did have a CT scan showing gallstones, but no evidence of a dilated common bile duct or CBD stones.   PAST MEDICAL HISTORY: Coronary artery disease status post stent, on Plavix.   PAST SURGICAL HISTORY: He has had a knee replacement 1-2 years ago, and one 1 year ago.   SOCIAL HISTORY: He denies alcohol or tobacco.   FAMILY HISTORY: No family history of GI malignancy.   HOME MEDICATIONS:  1. Omeprazole 20 mg daily.  2. Metoprolol 25 mg 1/2 tablet 2 times a day.  3. Plavix 75 mg daily.  4. Atorvastatin 20 mg daily.  5. Aspirin 81 mg daily.  6. Amlodipine once daily as needed.  7. Aleve as needed.  REVIEW OF SYSTEMS:   CONSTITUTIONAL: No weight gain or weight loss.  No fever or chills. HEENT: No oral lesions or sore throat. No vision changes. GASTROINTESTINAL: See HPI.  HEME/LYMPH: No easy bruising or bleeding. CARDIOVASCULAR: No chest pain or dyspnea on  exertion. GENITOURINARY: No hematuria. INTEGUMENTARY: No rashes or pruritus PSYCHIATRIC: No depression/anxiety.  ENDOCRINE: No heat/cold intolerance, no hair loss or skin changes. ALLERGIC/IMMUNOLOGIC: Negative for hives. RESPIRATORY: No cough, no shortness of breath.  MUSCULOSKELETAL: No joint swelling or muscle pain.   PHYSICAL EXAMINATION: VITAL SIGNS: Temperature is 97.8, pulse 50, respirations are 18, blood pressure is 96/58, pulse oximetry is 94% on room air.  GENERAL: Alert and oriented times 4.  No acute distress. Appears stated age. HEENT: Normocephalic/atraumatic. Extraocular movements are intact. Anicteric. NECK: Soft, supple. JVP appears normal. No adenopathy. CHEST: Clear to auscultation. No wheeze or crackle. Respirations unlabored. HEART: Regular. No murmur, rub, or gallop.  Normal S1 and S2. ABDOMEN: Positive for epigastric tenderness to palpation. Normoactive bowel sounds, soft, no rebound, no guarding.  EXTREMITIES: No swelling, well perfused. SKIN: No rash or lesion. Skin color, texture, turgor normal. NEUROLOGICAL: Grossly intact. PSYCHIATRIC: Normal tone and affect. MUSCULOSKELETAL: No joint swelling or erythema.  PSYCHIATRIC: Normal tone and affect.   LABORATORY DATA: Basic metabolic panel is normal. Liver enzymes: Total bilirubin 1.9, direct 0.9, alkaline phosphatase is 212, AST 173 ALT is 179. His troponin is normal. White count is 7, hemoglobin is 15, hematocrit 44, platelets are 144.   IMAGING STUDIES: He had a CT of the abdomen and pelvis with contrast, which showed layering stones in the gallbladder. No common bile duct dilation.   ASSESSMENT AND PLAN: Cholelithiasis, elevated liver enzymes, possible common bile duct stone: I would recommend performing  an MRCP to evaluate for the presence of common bile duct stones. Given that he is on Plavix, he would need to wait out 5 days to be able to undergo a sphincterotomy. If he were to develop cholangitis, he could  undergo an ERCP with just stent placement without sphincterotomy if it became urgent. We will await the findings on the MRI to help guide further management.   RECOMMENDATIONS: 1. MRCP.  2. Continue to trend liver enzymes.  3. Further recommendations pending findings on MRCP.   Thank you for this consult    ____________________________ Arther Dames, MD mr:mw D: 07/05/2014 16:44:29 ET T: 07/05/2014 17:02:06 ET JOB#: [id number]  cc: Arther Dames, MD, <Dictator> Mellody Life MD ELECTRONICALLY SIGNED 07/15/2014 15:13

## 2014-09-29 NOTE — H&P (Signed)
PATIENT NAME:  Christopher [name], Christopher [name] MR#:  [id number] DATE OF BIRTH:  02-26-1932  DATE OF ADMISSION:  07/04/2014  PRIMARY CARE PHYSICIAN: Cheral Marker. Ola Spurr, MD   REFERRING EMERGENCY ROOM PHYSICIAN: Larae Grooms, MD   CHIEF COMPLAINT: Abdominal pain.  HISTORY OF PRESENT ILLNESS: An 81 year old male who has past history of coronary artery disease and 3 stent placement who takes aspirin and Plavix and he also take Aleve PM for nighttime sleep. For the last 2 weeks, he has epigastric pain. Initially it was mild 2 weeks ago, staying in the epigastric region, stabbing type, no radiation and it just subsided by itself. Again, he had pain one more time in between and subsided by itself after a few hours. Today the pain started, which was 10/10, constant, stabbing type pain, no radiating, so he decided to come to the Emergency Room. He was given morphine, which relieved the pain and currently no pain. He denies any nausea, vomiting, any blood in the stool. CAT scan of the abdomen was done by ER physician, which showed some gallstones, but not obstruction. His LFTs were slightly elevated, given to hospitalist team after speaking to surgical doctor, as per him there might not be active surgical issue.   REVIEW OF SYSTEMS:  CONSTITUTIONAL: Negative for fever, fatigue, weakness, pain, or weight loss.  EYES: No blurring, double vision, discharge or redness.  EARS, NOSE, THROAT: No tinnitus, ear pain, or hearing loss.  RESPIRATORY: No cough, wheezing, hemoptysis, or shortness of breath.  CARDIOVASCULAR: No chest pain, orthopnea, edema, arrhythmia, or palpitations.  GASTROINTESTINAL: The patient has no nausea, vomiting, or diarrhea, but has abdominal pain.  GENITOURINARY: No dysuria, hematuria, increased frequency.  ENDOCRINE: No heat or cold intolerance. No excessive sweating.  SKIN: No acne, rashes, or lesions.  MUSCULOSKELETAL: No pain or swelling in the joints.  NEUROLOGICAL: No numbness, weakness, tremor. No  vertigo.  PSYCHIATRIC: No anxiety, insomnia, bipolar disorder.   PAST MEDICAL HISTORY: Coronary artery disease, status post 3 stent placement. Dr. Rockey Situ is his cardiologist.   PAST SURGICAL HISTORY: Positive for left knee replacement surgery 2 years ago and right knee replacement almost a year ago.   SOCIAL HISTORY: He denies smoking. No drinking. No alcohol.   FAMILY HISTORY: Mother had a heart problem.   HOME MEDICATIONS:  1.  Omeprazole 20 mg oral once a day.  2.  Metoprolol 25 mg take 1/2 tablet 2 times a day.  3.  Plavix 75 mg once a day.  4.  Atorvastatin 20 mg oral once a day.  5.  Aspirin 81 mg once a day.  6.  Amlodipine and benazepril take once a day.  7.  Aleve 220 mg 2 tablets every 8 hours as needed but patient was taking mostly at nighttime.   PHYSICAL EXAMINATION:  VITAL SIGNS: Temperature 98, pulse 94, respirations 18, blood pressure 122/56, pulse oximetry is 94% on room air.  GENERAL: The patient is fully alert and oriented to time, place, and person. Does not appear in acute distress.  HEENT: Head and neck atraumatic. Conjunctivae pink. Oral mucosa moist.  NECK: Supple. No JVD.  RESPIRATORY: Bilateral equal and clear air entry.  CARDIOVASCULAR: S1, S2 present, regular. No murmur.  ABDOMEN: There is tenderness and guarding in epigastric area. No tenderness in right upper quadrant. Bowel sounds normal. No organomegaly felt.  SKIN: No acne, rashes, or lesions.  MUSCULOSKELETAL: No tenderness or swelling in the joints.  NEUROLOGIC: Power 5/5, follows commands. Moves all 4 limbs. No tremor or  rigidity. Sensation is intact.  LEGS: No edema.  PSYCHIATRIC: Does not appear in acute psychiatric illness at this time.   IMPORTANT LABORATORY RESULTS:  1.  Glucose 143, BUN 21, creatinine 1.11, sodium 139, potassium 4.3, chloride 104, CO2 of 29, calcium is 8.7, lipase is 247.  2.  Total protein 6.9, bilirubin 1.9, direct bilirubin 0.9, alkaline phosphatase 212, SGOT 173, and  SGPT 179.  3.  Troponin less than 0.02.  4.  WBC 7.1, hemoglobin 15.2, platelet count 144,000, MCV is 90.  5.  CT scan of the abdomen and pelvis with contrast is done, which shows cholelithiasis, coronary artery disease, bilateral benign-appearing renal cyst.   ASSESSMENT AND PLAN: An 81 year old male who has a past history of coronary artery disease, take aspirin, Plavix and Aleve for pain every day, came with epigastric pain, severe and sharp, nonradiating.  1.  Epigastric pain: Most likely this is peptic ulcer disease or it might be because of gallstone, although CAT scan does not show any impacted stone. Currently we will call gastroenterology consult. We will give Protonix b.i.d. and give morphine for pain management.  2.  Gallstone: Call surgical consult for addressing this issue. Pain management with morphine.  3.  Elevated liver function tests: We will continue followup. The patient has some gallstone, but there is no infection, as per the CAT scan. Gastroenterology consult.  4.  Coronary artery disease: We will hold aspirin for now, continue statin and Plavix because of possibility of peptic ulcer disease.   CODE STATUS: Full code.   TOTAL TIME SPENT ON THIS ADMISSION: 50 minutes.    ____________________________ Ceasar Lund Anselm Jungling, MD vgv:bm D: 07/05/2014 01:02:37 ET T: 07/05/2014 01:54:49 ET JOB#: [id number]  cc: Ceasar Lund. Anselm Jungling, MD, <Dictator> Cheral Marker. Ola Spurr, MD [facility] MD ELECTRONICALLY SIGNED 07/17/2014 16:11

## 2014-09-29 NOTE — H&P (Signed)
PATIENT NAME:  Christopher [name], WARING MR#:  [id number] DATE OF BIRTH:  Jan 17, 1932  DATE OF ADMISSION:  09/16/2014  PRIMARY CARE PHYSICIAN:  Cheral Marker. Ola Spurr, MD   PRIMARY CARDIOLOGIST:  Minna Merritts, MD  REFERRING EMERGENCY ROOM PHYSICIAN:  Debbrah Alar, MD   CHIEF COMPLAINT:  Chest pain.   HISTORY OF PRESENT ILLNESS:  The patient is an 81 year old Caucasian male with past medical history of coronary artery disease, status post 3 stents, currently taking aspirin 81 mg and Plavix 75 mg, presenting to the ED with a chief complaint of chest pain.  This morning, patient was helping to load some compost into a truck and then he started having chest pain.  The chest pain was midsternal radiating to the left side of the chest and neck associated with some shortness of breath.  It was persistent and pressure-like.  He came into the ED.  First set of troponins are negative.  EKG is negative.  Patient was chest pain-free in the hospital during my examination in the ED, resting comfortably.  Hospitalist team was contacted to admit the patient  given his cardiac history and stent placement in the past.   PAST MEDICAL HISTORY:  Coronary artery disease, status post 3 cardiac stents; hypertension; GERD.    PAST SURGICAL HISTORY:  Coronary artery stent placement, left knee replacement surgery 2 years ago, right knee replacement also done.   ALLERGIES:  No known drug allergies.   PSYCHOSOCIAL HISTORY:  Lives at home with wife.  Wife is very demented.  Caregiver takes care of her.  Denies any smoking, alcohol, or illicit drug usage.   FAMILY HISTORY:  Mother had a heart problem.   REVIEW OF SYSTEMS:  CONSTITUTIONAL:  Denies any fever, fatigue, weakness.  EYES:  Denies blurry vision, double vision.  EARS, NOSE, AND THROAT:  Denies epistaxis, discharge.  RESPIRATORY:  Denies cough, COPD.   CARDIOVASCULAR:  No chest pain during my examination but the patient came into the hospital with a chief complaint  of chest pain and shortness of breath.  Denies any palpitations.  GASTROINTESTINAL:  Denies nausea, vomiting, diarrhea, hematemesis, melena.  GENITOURINARY:  No dysuria or hematuria.  ENDOCRINE:  Denies polyuria, nocturia, thyroid problems.  HEMATOLOGIC AND LYMPHATIC:  No anemia, easy bruising or bleeding.  INTEGUMENTARY:  No acne, rash, lesions.  MUSCULOSKELETAL:  No joint pain in the neck and back.  Denies any swelling in the joints.  NEUROLOGIC:  Denies any vertigo.  No ataxia.  No weakness, numbness.  PSYCHIATRIC:  No anxiety, insomnia.    HOME MEDICATIONS:  Aspirin 81 mg enteric coated p.o. once daily, Plavix 75 mg once daily, omeprazole 20 mg once daily, metoprolol tartrate 25 mg half-tablet p.o. b.i.d., atorvastatin 20 mg once daily, amlodipine/benazepril 10/20 once daily, Percocet 5/325 one tablet p.o. every 8 hours as needed for pain, levobunolol ophthalmic drops once a day to both eyes.   PHYSICAL EXAMINATION:  VITAL SIGNS:  Temperature 98.3, pulse 68, respirations 18, blood pressure 142/76, pulse oximetry 96%.  GENERAL APPEARANCE:  Not in acute distress, moderately built, well nourished.  HEENT:  Normocephalic, atraumatic.  Pupils are equally reacting to light and accommodation. No scleral icterus.  No conjunctival injection.  No sinus tenderness. No postnasal drip.  Moist mucous membranes.  NECK:  Supple.  No JVD.  No thyromegaly.  Range of motion is intact.  LUNGS:  Clear to auscultation bilaterally, no accessory muscle use, and no anterior chest wall tenderness on palpation.  CARDIAC:  S1, S2 normal.  Regular rate and rhythm.  No murmurs.  ABDOMEN:  Soft.  Bowel sounds are positive in all four quadrants.  Nontender, nondistended. No masses felt.  NEUROLOGIC:  Awake, alert, oriented x 3.  Cranial nerves II through XII are grossly intact. Motor and sensory are intact.  Reflexes are 2+.  EXTREMITIES:  No edema.  No cyanosis.  No clubbing.  SKIN:  Warm, dry, normal turgor.  No rashes.   No lesions.  MUSCULOSKELETAL:  No joint effusion, tenderness, or edema.  PSYCHIATRIC:  Normal mood and affect.   LABORATORIES AND IMAGING STUDIES:  CPK-MB 1.5.  Troponin less than 0.03 x 2. CBC is normal.  BMP:  Anion gap 5, glucose is 199.  Rest of the BMP is normal.  EKG:  Normal sinus rhythm with no acute ST-T wave changes.  Chest x-ray, portable, single view:  No acute abnormality.   ASSESSMENT AND PLAN:  An 81 year old Caucasian male presented with a history of coronary artery disease, status post 3 stent placements in the past, presenting to the Emergency Department with a chief complaint of chest pain, will be admitted with the following assessment and plan.  1. Chest pain with history of coronary artery disease and status post 3 stents in the past.  Will rule out acute myocardial infarction.  Will admit him to telemetry.  Is somewhat atypical because patient started having chest pain while trying to load his truck with compost but given the cardiac history and classic presentation of the chest pressure radiating to the neck and left side of the shoulder, will recycle cardiac biomarkers, monitor him on telemetry.  Will continue aspirin, Plavix, statin, and beta blocker. Cardiology consult is placed to Dr. Rockey Situ.  Currently the patient is asymptomatic. 2. Hypertension.  Blood pressure is well controlled.  Will continue his home medication metoprolol.  3. Hyperlipidemia.  Continue statin.  4. Gastroesophageal reflux disease.  Will provide gastrointestinal prophylaxis.  5. Will provide gastrointestinal and deep vein thrombosis prophylaxis with Protonix and Lovenox subcutaneously.    Plan of care discussed in detail with the patient.  He verbalized understanding of the plan.   TOTAL TIME SPENT:  50 minutes.   CODE STATUS:  He is full code.  Daughter is the medical power of attorney.    ____________________________ Nicholes Mango, MD ag:kc D: 09/16/2014 16:40:00 ET T: 09/16/2014 17:18:39  ET JOB#: [id number]  cc: Minna Merritts, MD Nicholes Mango, MD, <Dictator> Cheral Marker. Ola Spurr, MD      Nicholes Mango MD ELECTRONICALLY SIGNED 09/21/2014 17:50

## 2014-09-29 NOTE — Op Note (Signed)
PATIENT NAME:  Christopher [name], Christopher [name] MR#:  [id number] DATE OF BIRTH:  [birth year]  DATE OF PROCEDURE:  07/11/2014  PREOPERATIVE DIAGNOSIS: Choledocholithiasis.   POSTOPERATIVE DIAGNOSIS: Choledocholithiasis.  PROCEDURE: Laparoscopic cholecystectomy with C-arm fluoroscopic cholangiography.   SURGEON: Richard E. Burt Knack, MD   ANESTHESIA: General with endotracheal tube.   INDICATIONS: This is a patient with a history of choledocholithiasis with clearance of his duct by ERCP. Preoperatively, we discussed rationale for surgery, the options of observation, risk of bleeding, infection, recurrence of symptoms, failure to resolve his symptoms, open procedure, bile duct damage, bile duct leak, retained common bile duct stone, any of which could require further surgery and/or ERCP, stent, and papillotomy. He has been off his Plavix for several days and understands those risks as well. He and his family understood and agreed to proceed.   FINDINGS: Acute edematous cholecystitis. C-arm fluoroscopic cholangiography demonstrated good flow into the duodenum without intraluminal filling defects. Proximal ducts were well identified. The cystic duct had been cannulated.   DESCRIPTION OF PROCEDURE: The patient was induced to general anesthesia. He was given IV antibiotics. VTE prophylaxis was in place. He was prepped and draped in a sterile fashion. Marcaine was infiltrated in skin and subcutaneous tissues around the periumbilical area. An incision was made. Veress needle was placed. Pneumoperitoneum was obtained. A 5 mm trocar port was placed. The abdominal cavity was explored and, under direct vision, a 10 mm epigastric port and 2 lateral 5 mm ports were placed. The gallbladder was placed on tension. Adhesions were taken down bluntly. The edematous gallbladder was identified and the edematous peritoneum was taken down bluntly around the infundibulum. Here, the cystic lymphatics were doubly clipped and divided, allowing for good  visualization of both the cystic artery and the cystic duct as it entered the infundibulum. Here, the cystic duct was clipped and incised, and through a separate incision an Angiocath and cholangiogram catheter were placed. C-arm fluoroscopic cholangiography demonstrated the above without intraluminal filling defects. The cystic duct was then cleared of its cholangiogram catheter and doubly clipped and divided. The cystic artery was doubly clipped and divided in multiple branches. There were engorged vessels all along the medial and lateral side of the gallbladder fossa, which were cauterized as the gallbladder was taken from the gallbladder fossa with electrocautery. It was passed out through greatly enlarged epigastric port site. The port site was in place. The area was irrigated with copious amounts of normal saline. There was some bleeding from the gallbladder fossa, which was cauterized and 2 pieces of Surgicel were placed onto the gallbladder fossa and then through a lateral port site was placed a 10 mm JP drain into the foramen of Winslow, held in with 3-0 nylon. The area was checked for hemostasis and found to be adequate. Irrigation had been performed. There was no sign of bleeding or bowel injury or bile leak. Therefore, pneumoperitoneum was released. All ports were removed. The enlarged epigastric port site was closed with multiple figure-of-eight 0 Vicryl sutures and then skin staples were placed at all incisions. The drain was placed to bulb suction and sterile dressings were placed.   The patient tolerated the procedure well. There were no complications. He was taken to the recovery room in stable condition to be admitted for continued care.    ____________________________ Jerrol Banana. Burt Knack, MD rec:bm D: 07/11/2014 23:51:40 ET T: 07/12/2014 01:45:04 ET JOB#: [id number]  cc: Jerrol Banana. Burt Knack, MD, <Dictator> Florene Glen MD ELECTRONICALLY SIGNED 07/12/2014 6:57

## 2014-09-29 NOTE — Consult Note (Signed)
General Aspect Primary Cardiologist: Dr. Rockey Situ, MD __________________  81 year old male with history of CAD s/p stenting in 2005, intermittent dizziness, HTN, HLD, PVD, GERD, arthritis s/p bilateral knee replacements who presented to [facility] on 4/18 with 2 day history of worsening intermittent chest pain.  _________________  PMH: 1. CAD s/p stenting in 2005 2. Intermittent dizziness 3. HTN 4. HLD 5. PVD 6. GERD 7. Arthritis s/p bilateral knee replacements ___________________   Present Illness 81 year old male with the above problem list who presented to [facility] on 4/18 with 2 day history of worsening intermittent chest pain.  He has known CAD s/p PCI/DES to dRCA and mLAD in 2005 in the setting of [place]. Details of cardiac at that time in 2005 showed 90% mid LAD, 80% distal RCA, 50% OM1 at the mid vessel, Taxus stent 2.75 x 24 mm stent to his LAD, TAxus stent 2.5 x 16 mm stent to the RCA. He had follow up nuclear stress in October 2009 showing no ischemia. Again, with repeat nuclear stress testing in the setting of knee replacement surgery in July 2011 that showed mild ischemia in the inferior wall consistent with previous stress test. History of prior dizziness that resolved without intervention. He has history of no significant aortic disease, mild carotid plaquing with mixed irregular plaque estimated at 40% on the right. He continues to care for his wife at home who has Alzheimer's disease. There is significant stress at home.   On 09/14/2014 he was out in his yard picking up limbs and developed chest pain that lasted for a couple of hours and resolved with rest. The pain returned later that night. No associated symptoms. He has been having increased chest pain at home, clutching his chest at times stating "my chest." Has not sought medical care until now. He recently underwent cholecystectomy. Still with symptoms. While he was using a crane to load a truck on 4/18 he developed "13/10" chest pain.  States this was the worst chest pain he has had. He wanted to call out office, but could not take the pain and presented to the ED. No associated symptoms. Has continued to have intermittent chest pain, none currently.   Upon his arrival to [facility] ED he was found to have troponin negative x 3, EKG showed NSR, no st/t changes. CXR non-acute. Echo showed normal LV function, mild MR, high normal RVSP. He is scheduled for cardiac cath this PM.   Physical Exam:  GEN well developed, no acute distress, obese   HEENT PERRL, hearing intact to voice, moist oral mucosa   NECK supple  trachea midline  no JVD   RESP normal resp effort  clear BS   CARD Regular rate and rhythm  No murmur   ABD denies tenderness  soft   LYMPH negative neck   EXTR negative edema   SKIN normal to palpation   NEURO motor/sensory function intact   PSYCH alert, A+O to time, place, person, good insight   Review of Systems:  Subjective/Chief Complaint chest tightness, resolved   General: Fatigue   Skin: No Complaints   ENT: No Complaints   Eyes: No Complaints   Neck: No Complaints   Respiratory: No Complaints   Cardiovascular: Chest pain or discomfort   Gastrointestinal: No Complaints   Genitourinary: No Complaints   Vascular: No Complaints   Musculoskeletal: No Complaints   Neurologic: No Complaints   Hematologic: No Complaints   Endocrine: No Complaints   Psychiatric: No Complaints  Review of Systems: All other systems were reviewed and found to be negative   Medications/Allergies Reviewed Medications/Allergies reviewed   Family & Social History:  Family and Social History:  Family History Coronary Artery Disease  Hypertension   Social History negative tobacco, negative ETOH, negative Illicit drugs   Place of Living Home  lives with wife     Hypertension:    Arthritis:    Hard of Hearing:    Gastric Reflux:    Cholecystectomy:    Right Knee Replacement:    left total  knee: 15-Jun-2010   Right knee arthroscopy:    2 cardiac stents:    Cardiac Surgery: cardiac stents         Admit Diagnosis:   CORONARY ARTERY DISEASE: Onset Date: 17-Sep-2014, Status: Active, Description: CORONARY ARTERY DISEASE  Home Medications: Medication Instructions Status  aspirin 81 mg oral tablet 1 tab(s) orally once a day (in the morning) Active  Metoprolol Tartrate 25 mg oral tablet 0.5 tab(s) orally 2 times a day Active  atorvastatin 20 mg oral tablet 1 tab(s) orally once a day (in the morning) Active  amLODIPine-benazepril 10 mg-20 mg capsule 1 cap(s) orally once a day (in the morning)  Active  levobunolol ophthalmic 0.5% ophthalmic solution 1 drop(s) to each affected eye once a day in both eyes Active  omeprazole 20 mg oral delayed release capsule 1 cap(s) orally once a day Active   Lab Results:  Routine Chem:  19-Apr-16 04:42   Cholesterol, Serum 121 (0-200 NOTE: New Reference Range  08/06/14)  Triglycerides, Serum 129 (0-149 NOTE: New Reference Range  08/06/14)  HDL (INHOUSE)  33 (40-1000 NOTE: New Reference Range:  08/06/14)  VLDL Cholesterol Calculated 26 (0-40 NOTE: New Reference Range  08/06/14)  LDL Cholesterol Calculated 62 (0-99 NOTE: New Reference Range:  08/06/14)  Glucose, Serum  160 (65-99 NOTE: New Reference Range  08/06/14)  BUN 18 (6-20 NOTE: New Reference Range  08/06/14)  Creatinine (comp) 0.86 (0.61-1.24 NOTE: New Reference Range  08/06/14)  Sodium, Serum  133 (135-145 NOTE: New Reference Range  08/06/14)  Potassium, Serum 4.0 (3.5-5.1 NOTE: New Reference Range  08/06/14)  Chloride, Serum 106 (101-111 NOTE: New Reference Range  08/06/14)  CO2, Serum 27 (22-32 NOTE: New Reference Range  08/06/14)  Calcium (Total), Serum  8.2 (8.9-10.3 NOTE: New Reference Range  08/06/14)  Anion Gap  0  eGFR (African American) >60  eGFR (Non-African American) >60 (eGFR values <28m/min/1.73 m2 may be an indication of chronic kidney  disease (CKD). Calculated eGFR is useful in patients with stable renal function. The eGFR calculation will not be reliable in acutely ill patients when serum creatinine is changing rapidly. It is not useful in patients on dialysis. The eGFR calculation may not be applicable to patients at the low and high extremes of body sizes, pregnant women, and vegetarians.)  Cardiac:  18-Apr-16 10:58   Troponin I <0.03 (0.00-0.03 0.03 ng/mL or less: NEGATIVE  Repeat testing in 3-6 hrs  if clinically indicated. >0.05 ng/mL: POTENTIAL  MYOCARDIAL INJURY. Repeat  testing in 3-6 hrs if  clinically indicated. NOTE: An increase or decrease  of 30% or more on serial  testing suggests a  clinically important change NOTE: New Reference Range  08/06/14)    15:26   Troponin I <0.03 (0.00-0.03 0.03 ng/mL or less: NEGATIVE  Repeat testing in 3-6 hrs  if clinically indicated. >0.05 ng/mL: POTENTIAL  MYOCARDIAL INJURY. Repeat  testing in 3-6 hrs if  clinically indicated. NOTE: An  increase or decrease  of 30% or more on serial  testing suggests a  clinically important change NOTE: New Reference Range  08/06/14)    19:20   Troponin I <0.03 (0.00-0.03 0.03 ng/mL or less: NEGATIVE  Repeat testing in 3-6 hrs  if clinically indicated. >0.05 ng/mL: POTENTIAL  MYOCARDIAL INJURY. Repeat  testing in 3-6 hrs if  clinically indicated. NOTE: An increase or decrease  of 30% or more on serial  testing suggests a  clinically important change NOTE: New Reference Range  08/06/14)  Routine Hem:  19-Apr-16 04:42   WBC (CBC) 7.8  RBC (CBC)  4.29  Hemoglobin (CBC)  12.8  Hematocrit (CBC)  37.8  Platelet Count (CBC)  143  MCV 88  MCH 29.9  MCHC 34.0  RDW  15.7  Neutrophil % 67.8  Lymphocyte % 20.5  Monocyte % 9.2  Eosinophil % 1.7  Basophil % 0.8  Neutrophil # 5.3  Lymphocyte # 1.6  Monocyte # 0.7  Eosinophil # 0.1  Basophil # 0.1 (Result(s) reported on 17 Sep 2014 at 05:44AM.)   EKG:  EKG  Interp. by me   Interpretation NSR, 66 bpm, no significant st/t changes   Radiology Results: XRay:    18-Apr-16 13:02, Chest Portable Single View  Chest Portable Single View   REASON FOR EXAM:    chest pain  COMMENTS:       PROCEDURE: DXR - DXR PORTABLE CHEST SINGLE VIEW  - Sep 16 2014  1:02PM     CLINICAL DATA:  Chest pain.    EXAM:  PORTABLE CHEST - 1 VIEW    COMPARISON:  07/22/2014 and CT scan of the chest dated 05/30/2013    FINDINGS:  Chronic substernal goiter. Heart size and pulmonary vascularity are  normal. No infiltrates or effusions. Chronic linear scarring in the  lingula. No acute osseous abnormality. Benign bone island in the  posterior aspect of the left fifth rib.     IMPRESSION:  No acute abnormalities.      Electronically Signed    By: Lorriane Shire M.D.    On: 09/16/2014 13:29         Verified By: Larey Seat, M.D.,  Cardiology:    18-Apr-16 19:33, Echo Doppler  Echo Doppler   REASON FOR EXAM:      COMMENTS:       PROCEDURE: [facility] - ECHO DOPPLER COMPLETE(TRANSTHOR)  - Sep 16 2014  7:33PM     RESULT: Echocardiogram Report    Patient Name:   Christopher [name] Date of Exam: 09/16/2014  Medical Rec #:  [id number]       Custom1:  Date of Birth:  10/21/1931    Height:       71.0 in  Patient Age:    61 years     Weight:       209.0 lb  Patient Gender: M            BSA:          2.15 m??    Indications: MI  Sonographer:    Arville Go RDCS  Referring Phys: Nicholes Mango    Summary:   1. Left ventricular ejection fraction, by visual estimation, is 60 to   65%.   2. Normal global left ventricular systolic function.   3. Normal right ventricular size and systolic function.   4. Mildly dilated left atrium.   5. Mild mitral valve regurgitation.   6. High normal RVSP  2D AND M-MODE MEASUREMENTS (  normal ranges within parentheses):  Left Ventricle:          Normal  IVSd (2D):      1.28 cm (0.7-1.1)  LVPWd (2D):     1.18 cm (0.7-1.1) Aorta/LA:                   Normal  LVIDd (2D):     4.13cm (3.4-5.7) Aortic Root (2D): 3.30 cm (2.4-3.7)  LVIDs (2D):     2.88 cm           Left Atrium (2D): 4.90 cm (1.9-4.0)  LV FS (2D):     30.3 %   (>25%)  LV EF (2D):     58.1 %   (>50%)                                    Right Ventricle:                        RVd (2D):  LV DIASTOLIC FUNCTION:  MV Peak E: 0.73 m/s E/e' Ratio: 8.30  MV Peak A: 1.15 m/s Decel Time: 296 msec  E/A Ratio: 0.64  SPECTRAL DOPPLER ANALYSIS (where applicable):  Mitral Valve:  MV P1/2 Time: 85.84 msec  MV Area,PHT: 2.56 cm??  Aortic Valve: AoV Max Vel: 1.96 m/s AoV Peak PG: 15.4 mmHg AoV Mean PG:   8.0 mmHg  LVOT Vmax: 1.39 m/s LVOT VTI:  LVOT Diameter: 2.00 cm  AoV Area, Vmax: 2.23 cm?? AoV Area, VTI:  AoV Area, Vmn:  Tricuspid Valve and PA/RV Systolic Pressure: TR Max Velocity: 2.70 m/s RA   Pressure: 5 mmHg RVSP/PASP: 34.2 mmHg  Pulmonic Valve:  PV Max Velocity: 1.22 m/s PV Max PG: 6.0 mmHg PV Mean PG:    PHYSICIAN INTERPRETATION:  Left Ventricle: The left ventricular internal cavity size was normal. No   left ventricular hypertrophy. Global LV systolic function was normal.   Left ventricular ejection fraction, by visual estimation, is 60 to 65%.   Spectral Doppler shows normal pattern of LV diastolic filling.  Right Ventricle: Normal right ventricular size, wall thickness, and   systolic function. The right ventricular size is normal. Global RV   systolic function is normal.  Left Atrium: The left atrium is mildly dilated.  Right Atrium: The right atrium is normal in size.  Pericardium: Thereis no evidence of pericardial effusion.  Mitral Valve: The mitral valve is normal in structure. Mild mitral valve   regurgitation is seen.  Tricuspid Valve: The tricuspid valve is normal. Trivial tricuspid   regurgitation is visualized. The tricuspidregurgitant velocity is 2.70   m/s, and with an assumed right atrial pressure of 5 mmHg, the estimated   right ventricular  systolic pressure is normal at 34.2 mmHg.  Aortic Valve: The aortic valve was not well seen. The aortic valve is   structurallynormal, with no evidence of sclerosis or stenosis. No   evidence of aortic valve regurgitation is seen.  Pulmonic Valve: The pulmonic valve is normal. Trace pulmonic valve   regurgitation.  Aorta: The aortic root and ascending aorta are structurally normal, with     no evidence of dilitation.    [id number] Ida Rogue MD  Electronically signed by [id number] Ida Rogue MD  Signature Date/Time: 09/17/2014/8:05:13 AM    *** Final ***    IMPRESSION: .        Verified By: Minna Merritts, M.D.,  MD    No Known Allergies:   Vital Signs/Nurse's Notes: **Vital Signs.:   19-Apr-16 06:45  Vital Signs Type Admission  Temperature Temperature (F) 98.3  Celsius 36.8  Temperature Source oral  Pulse Pulse 63  Respirations Respirations 19  Systolic BP Systolic BP 696  Diastolic BP (mmHg) Diastolic BP (mmHg) 69  Mean BP 87  Pulse Ox % Pulse Ox % 96  Pulse Ox Activity Level  At rest  Oxygen Delivery Room Air/ 21 %    Impression 81 year old male with history of CAD s/p stenting in 2005, intermittent dizziness, HTN, HLD, PVD, GERD, arthritis s/p bilateral knee replacements who presented to [facility] on 4/18 with 2 day history of worsening intermittent chest pain.   1. Unstable angina vs GERD:  -Stuttering chest pain, worsening in nature -Cardiac cath this afternoon, 1:30 PM. He does not want stress test, prefers to "know for sure" -Echo showed normal LV function -Continue nitro paste -Risks and benefits of cardiac cath were explained in detail including risk of bleeding, bruising, infection, kidney damage, stroke, and death. He understands these risks and is willing to proceed with the procedure  2. CAD s/p PCI/DES in 2005: -Continue Lopressor 12.5 mg bid, aspirin 81 mg, Plavix 75 mg, Lipitor 20 mg -Cath as above today  3. HTN: -Continue Lotrel 5/20 mg and Lopressor  as above  4. GERD: -Continue Zantac 150 mg bid  5. Recent S/p Lap chole, ercp possible residual pain from surgery (if cardiac cath shows no new issues)   Electronic Signatures: Rise Mu (PA-C)  (Signed 19-Apr-16 09:05)  Authored: General Aspect/Present Illness, History and Physical Exam, Review of System, Family & Social History, Past Medical History, Home Medications, Labs, EKG , Radiology, Allergies, Vital Signs/Nurse's Notes, Impression/Plan Ida Rogue (MD)  (Signed 19-Apr-16 09:43)  Authored: General Aspect/Present Illness, History and Physical Exam, Review of System, Health Issues, Impression/Plan  Co-Signer: General Aspect/Present Illness, Home Medications, Allergies   Last Updated: 19-Apr-16 09:43 by Ida Rogue (MD)

## 2014-09-29 NOTE — Consult Note (Signed)
PATIENT NAME:  Christopher [name], Christopher [name] MR#:  [id number] DATE OF BIRTH:  23-Feb-1932  DATE OF CONSULTATION:  07/05/2014  REFERRING PHYSICIAN:   CONSULTING PHYSICIAN:  Harrell Gave A. Elsey Holts, MD  REASON FOR CONSULTATION: Abdominal pain, gallstones, hyperbilirubinemia and elevated LFTs.   HISTORY OF PRESENT ILLNESS: Christopher [name] is a pleasant 81 year old who has a known history of coronary artery disease and 3 stent placements approximately 8 to 10 years ago, is on aspirin and Plavix, and he takes Aleve PM for nighttime sleep. Over the last 2 weeks, he has had epigastric pain, has never had this before. He has a known history of gallstones. It was mild 2 weeks ago and he had 1 episode which was horrible yesterday, for which he was brought to the ED and received some morphine and this relieved the pain. No nausea, vomiting, chest pain, headache, shortness of breath, cough, diarrhea, constipation, dysuria or hematuria. CT scan showed gallstones with no obvious obstruction or dilatation of common bile duct. He currently feels normal.   PAST MEDICAL HISTORY: Coronary artery disease status post stent placement, (Dr. 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   SOCIAL HISTORY:  Denies alcohol, smoking or drug use.   FAMILY HISTORY: Mother with heart issues.   HOME MEDICATIONS: Omeprazole, metoprolol, Plavix, atorvastatin, aspirin, amlodipine, benazepril and Aleve.  REVIEW OF SYSTEMS: A 12 point review of systems was obtained. Pertinent positives and negatives as above.   PHYSICAL EXAMINATION: VITAL SIGNS: Temperature 97.9, pulse 68, blood pressure 124/70, respirations 18, 94% on room air.  GENERAL: No acute distress. Alert and oriented x3. HEAD: Normocephalic, atraumatic.  EYES: No scleral icterus. No conjunctivitis.  FACE: No obvious facial trauma. Normal external nose. Normal external ears.  CHEST: Lungs clear  to auscultation, moving air well. HEART: Regular rate and rhythm. No murmurs, rubs, or gallops.  ABDOMEN: Soft, nontender, nondistended.  EXTREMITIES: Moves all extremities well. Strength 5/5.  NEUROLOGIC: Cranial nerves II through XII grossly intact.   DIAGNOSTIC DATA: White cell count 7.1, platelets 144,000. Bilirubin 1.9, alk phos 212, AST 173, ALT 179.  CT scan shows gallstones with no pericholecystic fluid. No gallbladder wall thickening. No common bile duct dilatation.   ASSESSMENT AND PLAN: Christopher [name] is a pleasant 81 year old with a history of cholestatic jaundice, coronary artery disease on Plavix who presents with epigastric pain which is now involving elevated bilirubin and LFTs. Will follow up on Dr. Percell Boston recommendation. Differential includes peptic ulcer disease, however, with hyperbilirubinemia concern for choledocholithiasis. Will need to be off Plavix for approximately 5 to 7 days before any surgical intervention. Would recommend cardiology consult prior to any surgical intervention as well.   ____________________________ Glena Norfolk. Guinevere Stephenson, MD cal:sb D: 07/05/2014 08:45:16 ET T: 07/05/2014 10:19:28 ET JOB#: [id number]  cc: Harrell Gave A. Eddrick Dilone, MD, <Dictator> Floyde Parkins MD ELECTRONICALLY SIGNED 07/18/2014 22:29

## 2014-09-29 NOTE — Consult Note (Signed)
ERCP showed small caliber CBD. However, biliary sphincterotomy and balloon sweeps revealed multiple small stones which were extracted. Pt can proceed with GB surgery any time. thanks  Electronic Signatures: Verdie Shire (MD)  (Signed on 10-Feb-16 15:58)  Authored  Last Updated: 10-Feb-16 15:58 by Verdie Shire (MD)

## 2014-09-29 NOTE — Consult Note (Signed)
Details:   - GI Note:  No further pain episodes.  T. bili down only slightly. Denies f/c, n/v.   Exam: vss nad, a and o x 4, cta, no w/c abd: d,nabs soft, +some epigastric tenderness.  Labs: T.bili 6 MRCP: possible early cholecystitis.   us/ with dopplers: no thrombosis.   A/P: Suspect this hyperbili from cholecystitis vs passed CBD stone.  No evidence of CBD stone on MRCP and no evidence of CBD dil.  - f/u hepatitis panel, ASMA but doubt this is intrahepatic disease, esp given epigastric pain, worse with eating, and presenting in discrete episodes of severe pain.  - daily liver enzymes, INR   Electronic Signatures: Arther Dames (MD)  (Signed 08-Feb-16 16:31)  Authored: Details   Last Updated: 08-Feb-16 16:31 by Arther Dames (MD)

## 2014-09-29 NOTE — Discharge Summary (Signed)
PATIENT NAME:  Christopher [name], SYME MR#:  [id number] DATE OF BIRTH:  [birth year]  DATE OF ADMISSION:  07/10/2014 DATE OF DISCHARGE:  07/13/2014  PRIMARY CARE PHYSICIAN: Dr. Bonnye Fava  For details of discharge summary, please refer to the interim discharge summary dictated by Dr. Anselm Jungling.   FINAL DISCHARGE DIAGNOSES: 1. Cholelithiasis with choledocholithiasis.  2. Abnormal liver function tests.  3. Hypertension.  4. Hyperlipidemia.  5. Gastroesophageal reflux disease.  6. History of coronary artery disease, status post stent.   PROCEDURES:  1. ERCP.  2. Laparoscopic cholecystectomy,   CONDITION: Stable.   CODE STATUS: Full code.  m HOME MEDICATIONS: Please refer to the medication reconciliation list.   DIET: Low-sodium, low-fat, low-cholesterol diet.   ACTIVITY: As tolerated.   FOLLOWUP CARE: Follow with PCP within 1-2 weeks. Follow up with Dr. Marina Gravel within 1-2 weeks. The patient has cholelithiasis and choledocholithiasis, status post cholecystectomy.   HOSPITAL COURSE: The patient only has some mild abdominal sore. No nausea, vomiting, or diarrhea. He tolerated diet. He is clinically stable and will be discharged to home today. For coronary artery disease, we will resume Plavix, and continue statin and beta blocker. For hypertension, resume hypertension medication.   I discussed the patient's discharge plan with the patient, and the patient's daughter and nurse.   TIME SPENT: About 35 minutes    ____________________________ Demetrios Loll, MD qc:mw D: 07/13/2014 15:10:39 ET T: 07/13/2014 15:41:40 ET JOB#: [id number]  cc: Demetrios Loll, MD, <Dictator> Demetrios Loll MD ELECTRONICALLY SIGNED 07/14/2014 12:56

## 2014-09-29 NOTE — Consult Note (Signed)
Details:   - GI Note:  T. bili up today to 8.  Otherwise lft unchanged.  MRI shows posible cholecystitis but no CBD stone.   No epigastric pain currently.    Still suspect this is passed CBD stone, with T.bili lagging but will also check serologic w/u and dopplers.   Recs: - liver serologies - liver doppler - f/u liver enzymes in am.   Electronic Signatures: Arther Dames (MD)  (Signed 06-Feb-16 16:23)  Authored: Details   Last Updated: 06-Feb-16 16:23 by Arther Dames (MD)

## 2014-09-29 NOTE — H&P (Signed)
PATIENT NAME:  Christopher [name], Christopher [name] MR#:  [id number] DATE OF BIRTH:  [birth year]  DATE OF ADMISSION:  09/16/2014  [facility]red to [facility] on (Dictation Anomaly)  <<MISSING TEXT>> critical ostial proximal LAD disease and proximal circumflex critical disease.  Needs bypass surgery.  The patient is going to be transferred to [facility].  Dr. Esmond Plants has aided in the transfer and (Dictation Anomaly) <<MISSING TEXT>> has been called.    HISTORY OF PRESENT ILLNESS:  The patient is an 81 year old male patient admitted on April 18 for chest pain.  The patient had history of CAD, status post stent in 2005.  Has been having intermittent dizziness and admitted on April 18 because of  (Dictation Anomaly) <<MISSING TEXT>> history of worsening intermittent chest pain.  The patient had a drug eluting stent in the distal RCA and mid LAD in 2005 for unstable angina.  The patient was doing well and recently has been having some dizziness.  The patient's troponins have been negative.  The patient was seen by cardiology, Dr. Esmond Plants, and patient was admitted to rule out chest pain and he was started on aspirin, statins, beta blockers, and was taken to cardiac catheterization.  Cardiac catheterization revealed critical proximal left circumflex, proximal LAD disease, estimated 90 to 95% stenoses.  The patients EF is more than 55%.  He needs to go to [facility] for CABG.    PAST MEDICAL HISTORY:  The patients other medical problems include pulmonary artery disease with three cardiac stents before, hypertension, and GERD.   PRESENT MEDICATIONS:  Aspirin 81 mg daily, amlodipine (Dictation Anomaly)<<MISSING TEXT>> 20 mg p.o. daily, Plavix 75 mg daily.  Please hold the Plavix for preparation for CABG tomorrow or whenever it happens. Metoprolol 12.5 mg p.o. b.i.d., morphine 2 mg q. 4 hours p.r.n. for severe pain, nitroglycerin 1 inch topically to chest b.i.d., Senna as needed for constipation, Simvastatin 20 mg at bedtime.    VITAL  SIGNS:  Blood pressure 132/68, temperature 97.6 Fahrenheit, heart rate 61.  The patient's (Dictation Anomaly)<<MISSING TEXT>> on room air.   LABORATORY, EKG AND IMAGING DATA:  EKG showed normal sinus rhythm at 66 beats per minute.  Patient's electrolytes were acceptable with sodium 137, potassium 4.2, creatinine 0.85.  The patients troponin positive at 0.03.  Chest x-ray was negative for pneumonia.  The patient's second troponin also 0.03 with normal CK and CK-MB.    TIME SPENT ON DISCHARGE PREPARATION:  More than 30 minutes.    ____________________________ Epifanio Lesches, MD [phone] D: 09/17/2014 15:10:40 ET T: 09/17/2014 15:59:26 ET JOB#: [id number]  cc: Epifanio Lesches, MD, <Dictator> Minna Merritts, MD Unknown cc

## 2014-09-29 NOTE — Consult Note (Signed)
Chief Complaint:  Subjective/Chief Complaint Waiting for surgery. Less abd pain. LFT coming down.   VITAL SIGNS/ANCILLARY NOTES: **Vital Signs.:   11-Feb-16 15:54  Vital Signs Type Q 8hr  Temperature Temperature (F) 97.7  Celsius 36.5  Temperature Source oral  Pulse Pulse 68  Respirations Respirations 18  Systolic BP Systolic BP 654  Diastolic BP (mmHg) Diastolic BP (mmHg) 85  Mean BP 110  Pulse Ox % Pulse Ox % 96  Pulse Ox Activity Level  At rest  Oxygen Delivery Room Air/ 21 %   Brief Assessment:  GEN no acute distress   Cardiac Regular   Respiratory clear BS   Gastrointestinal mild RUQ tenderness   Lab Results: Hepatic:  10-Feb-16 09:06   Bilirubin, Total  3.6  Alkaline Phosphatase  395  SGPT (ALT)  190  SGOT (AST)  90  Total Protein, Serum 7.0  Albumin, Serum  3.1  11-Feb-16 04:31   Bilirubin, Total  2.7  Alkaline Phosphatase  342  SGPT (ALT)  180  SGOT (AST)  85  Total Protein, Serum 6.5  Albumin, Serum  2.7  Routine Chem:  10-Feb-16 09:06   Glucose, Serum  138  BUN 11  Creatinine (comp) 1.05  Sodium, Serum 136  Potassium, Serum 3.9  Chloride, Serum 103  CO2, Serum 26  Calcium (Total), Serum 8.7  Osmolality (calc) 274  eGFR (African American) >60  eGFR (Non-African American) >60 (eGFR values <24m/min/1.73 m2 may be an indication of chronic kidney disease (CKD). Calculated eGFR, using the MRDR Study equation, is useful in  patients with stable renal function. The eGFR calculation will not be reliable in acutely ill patients when serum creatinine is changing rapidly. It is not useful in patients on dialysis. The eGFR calculation may not be applicable to patients at the low and high extremes of body sizes, pregnant women, and vegetarians.)  Anion Gap 7  11-Feb-16 04:31   Glucose, Serum  192  BUN 9  Creatinine (comp) 1.02  Sodium, Serum 136  Potassium, Serum 4.1  Chloride, Serum 103  CO2, Serum 27  Calcium (Total), Serum  8.1  Osmolality  (calc) 276  eGFR (African American) >60  eGFR (Non-African American) >60 (eGFR values <638mmin/1.73 m2 may be an indication of chronic kidney disease (CKD). Calculated eGFR, using the MRDR Study equation, is useful in  patients with stable renal function. The eGFR calculation will not be reliable in acutely ill patients when serum creatinine is changing rapidly. It is not useful in patients on dialysis. The eGFR calculation may not be applicable to patients at the low and high extremes of body sizes, pregnant women, and vegetarians.)  Anion Gap  6   Assessment/Plan:  Assessment/Plan:  Assessment CBD stones/ S/P extraction.   Plan For GB surgery. Will sign off. Call [place] if there are questions. Thanks.   Electronic Signatures: OhVerdie ShireMD)  (Signed 11-Feb-16 16:05)  Authored: Chief Complaint, VITAL SIGNS/ANCILLARY NOTES, Brief Assessment, Lab Results, Assessment/Plan   Last Updated: 11-Feb-16 16:05 by OhVerdie ShireMD)

## 2014-09-29 NOTE — Discharge Summary (Addendum)
PATIENT NAME:  Christopher [name], Christopher [name] MR#:  [id number] DATE OF BIRTH:  April 11, 1932  DATE OF ADMISSION:  09/16/2014 DATE OF DISCHARGE:  09/17/2014  DISPOSITION: Transferred to [facility].  REASON FOR TRANSFER: Critical ostial proximal LAD disease and proximal circumflex critical disease.  Needs bypass surgery.  The patient is going to be transferred to [facility]  Dr. Esmond Plants has aided in the transfer and [facility] has been called.    HISTORY OF PRESENT ILLNESS:  The patient is an 81 year old male patient admitted on April 18 for chest pain.  The patient had history of CAD, status post stent in 2005.  Has been having intermittent dizziness and admitted on April 18 because of two day history of worsening intermittent chest pain.  The patient had a drug eluting stent in the distal RCA and mid LAD in 2005 for unstable angina.  The patient was doing well and recently has been having some dizziness.  The patient's troponins have been negative.  The patient was seen by cardiology, Dr. Esmond Plants, and patient was admitted to rule out chest pain and he was started on aspirin, statins, beta blockers, and was taken to cardiac catheterization.  Cardiac catheterization revealed critical proximal left circumflex, proximal LAD disease, estimated 90 to 95% stenoses.  The patient's EF is more than 55%.  He needs to go to [facility] for CABG.    PAST MEDICAL HISTORY:  The patient's other medical problems include pulmonary artery disease with three cardiac stents before, hypertension, and GERD.   PRESENT MEDICATIONS:  Aspirin 81 mg daily, amlodipine-benazepril 10-20 mg p.o. daily, Plavix 75 mg daily.  Please hold the Plavix for preparation for CABG tomorrow or whenever it happens. Metoprolol 12.5 mg p.o. b.i.d., morphine 2 mg q. 4 hours p.r.n. for severe pain, nitroglycerin 1 inch topically to chest b.i.d., Senna as needed for constipation, Simvastatin 20 mg at bedtime.    VITAL SIGNS:  Blood pressure 132/68, temperature 97.6  Fahrenheit, heart rate 61.  The 99% on room air.  physical exam;cvs s1,s2 regular lungs'clear to auscultation abdomen;soft,NT,ND .BS present. LABORATORY, EKG AND IMAGING DATA:  EKG showed normal sinus rhythm at 66 beats per minute.  Patient's electrolytes were acceptable with sodium 137, potassium 4.2, creatinine 0.85.  The patient's troponin positive at 0.03.  Chest x-ray was negative for pneumonia.  The patient's second troponin also 0.03 with normal CK and CK-MB.    TIME SPENT ON DISCHARGE PREPARATION:  More than 30 minutes.    ____________________________ Epifanio Lesches, MD sk:[id number] D: 09/17/2014 15:10:00 ET T: 09/17/2014 15:59:26 ET JOB#: [id number]  cc: Minna Merritts, MD Epifanio Lesches, MD, <Dictator> Cheral Marker. Ola Spurr, MD    Epifanio Lesches MD ELECTRONICALLY SIGNED 10/01/2014 13:09

## 2014-10-14 DIAGNOSIS — I2510 Atherosclerotic heart disease of native coronary artery without angina pectoris: Secondary | ICD-10-CM | POA: Diagnosis not present

## 2014-10-14 DIAGNOSIS — I10 Essential (primary) hypertension: Secondary | ICD-10-CM | POA: Diagnosis not present

## 2014-10-14 DIAGNOSIS — E785 Hyperlipidemia, unspecified: Secondary | ICD-10-CM | POA: Diagnosis not present

## 2014-10-18 ENCOUNTER — Encounter: Payer: Self-pay | Admitting: Cardiovascular Disease

## 2014-10-18 ENCOUNTER — Ambulatory Visit (INDEPENDENT_AMBULATORY_CARE_PROVIDER_SITE_OTHER): Payer: Medicare Other | Admitting: Cardiovascular Disease

## 2014-10-18 ENCOUNTER — Encounter: Payer: Medicare Other | Admitting: Cardiovascular Disease

## 2014-10-18 VITALS — BP 132/62 | HR 53 | Ht 71.0 in | Wt 200.0 lb

## 2014-10-18 DIAGNOSIS — I2510 Atherosclerotic heart disease of native coronary artery without angina pectoris: Secondary | ICD-10-CM

## 2014-10-18 DIAGNOSIS — I4891 Unspecified atrial fibrillation: Secondary | ICD-10-CM

## 2014-10-18 DIAGNOSIS — Z951 Presence of aortocoronary bypass graft: Secondary | ICD-10-CM

## 2014-10-18 DIAGNOSIS — I214 Non-ST elevation (NSTEMI) myocardial infarction: Secondary | ICD-10-CM

## 2014-10-18 DIAGNOSIS — E782 Mixed hyperlipidemia: Secondary | ICD-10-CM

## 2014-10-18 DIAGNOSIS — I739 Peripheral vascular disease, unspecified: Secondary | ICD-10-CM

## 2014-10-18 MED ORDER — ATORVASTATIN CALCIUM 20 MG PO TABS: 20.0000 mg | ORAL_TABLET | Freq: Every day | ORAL | Status: DC

## 2014-10-18 NOTE — Assessment & Plan Note (Signed)
Appears to be recovering well following his bypass surgery. As indicated that he prefers to do rehabilitation on his own Unclear if this is because he needs to take care of his wife

## 2014-10-18 NOTE — Assessment & Plan Note (Signed)
Maintaining normal sinus rhythm. He has close follow-up with Dr. [name]. Perhaps at this time his pradaxa could be held. Could even hold the amiodarone if maintaining normal rhythm with no symptoms concerning for arrhythmia Will likely place him back on low-dose aspirin with Plavix

## 2014-10-18 NOTE — Assessment & Plan Note (Signed)
Mild to moderate carotid arterial disease We'll continue aggressive cholesterol management, diabetes management Improved numbers with recent weight loss

## 2014-10-18 NOTE — Assessment & Plan Note (Signed)
Cholesterol is at goal on the current lipid regimen. No changes to the medications were made.  

## 2014-10-18 NOTE — Patient Instructions (Addendum)
You are doing well. No medication changes were made.  Call if you would like to set up cardiac rehab  For family, Consider a CT coronary calcium score  Please call us if you have new issues that need to be addressed before your next appt.  Your physician wants you to follow-up in: 3 months.  You will receive a reminder letter in the mail two months in advance. If you don't receive a letter, please call our office to schedule the follow-up appointment.

## 2014-10-18 NOTE — Assessment & Plan Note (Signed)
Given the severity of his disease, will likely place him back on aspirin and Plavix when the pradaxa is held

## 2014-10-18 NOTE — Progress Notes (Signed)
Patient ID: Christopher [name], male    DOB: 03/29/1932, 81 y.o.   MRN: [id number]  HPI Comments: Mr. Saraceno is a very pleasant 81 year old gentleman with a history of coronary artery disease, stent placed to his distal RCA in 2005, stent placed to his mid LAD, recent catheterization 09/17/2014 showing critical ostial LAD and proximal left circumflex disease, transferred to [facility] for bypass surgery, who presents today for follow-up after recent three-vessel CABG.  He reports having severe anginal pain prior to surgery. Reports the surgery went relatively well though had to wait several days given he was on Plavix. He had atrial fibrillation, converting to normal sinus rhythm on amiodarone, started on anticoagulation, pradaxa. He denies any complications from anticoagulation.  Overall has been slowly getting his strength back. He does not think he needs cardiac rehabilitation and feels he can do this on his own. Blood pressure has been relatively well controlled on his current medication regimen. Son presents with him today.  Denies any significant cough or shortness of breath at rest. Some shortness of breath with exertion. No more chest pain concerning for angina. Chest is still sore at the site of the incision.  He reports 20 pound weight loss through the surgery  EKG on today's visit shows sinus bradycardia with rate 53 bpm, T-wave abnormality in anterolateral leads, inferior leads, APCs noted  Other past medical history mild carotid plaquing with mixed irregular plaque estimated at 40% on the right,  Continued significant stress in his life as his wife has dementia.    History of total knee replacement with rehabilitation at [facility]. At the rehabilitation, he was receiving atorvastatin 80 mg daily with Vytorin (dose unclear). He became very ill, had chills, rigors, sweating, urine was very dark. He was noticed to be jaundice and checked himself out of rehabilitation and went to the  hospital, noted to have hepatitis. Symptoms resolved by holding the statin's. LFTs improved back to normal  Lipitor was restarted at low dose 20 mg   Prior episode of severe dizziness. He was driving at the time and had to pullover. Symptoms resolved without intervention. That night he developed upper respiratory infection with runny nose. Currently has severe cough and  unable to sleep. Additional mild episode of dizziness several days later. No further episodes since then  Cardiac catheter September 2005 shows 90% mid LAD, 80% distal RCA, 50% OM1 at the mid vessel, Taxus stent 2.75 x 24 mm stent to his LAD, TAxus stent 2.5 x 16 mm stent to the RCA.   stress test in July 2011 showed mild ischemia in the inferior wall consistent with previous stress test      No Known Allergies  Outpatient Encounter Prescriptions as of 10/18/2014  Medication Sig  . amiodarone (PACERONE) 200 MG tablet Take 200 mg by mouth daily.  Marland Kitchen aspirin 81 MG EC tablet Take 81 mg by mouth daily.    Marland Kitchen atorvastatin (LIPITOR) 20 MG tablet Take 1 tablet (20 mg total) by mouth daily.  . dabigatran (PRADAXA) 150 MG CAPS capsule Take 1 capsule (150 mg total) by mouth every 12 (twelve) hours.  Marland Kitchen levobunolol (BETAGAN) 0.5 % ophthalmic solution Place 1 drop into both eyes every morning.  . metoprolol tartrate (LOPRESSOR) 25 MG tablet Take 1 tablet (25 mg total) by mouth 2 (two) times daily.  . Omega-3 Fatty Acids (FISH OIL) 1200 MG CAPS Take 1,200 mg by mouth daily.  Marland Kitchen omeprazole (PRILOSEC) 20 MG capsule Take 20 mg by mouth  daily.  . oxyCODONE (OXY IR/ROXICODONE) 5 MG immediate release tablet Take 1-2 tablets (5-10 mg total) by mouth every 3 (three) hours as needed for severe pain.  . [DISCONTINUED] atorvastatin (LIPITOR) 20 MG tablet Take 20 mg by mouth daily.  . [DISCONTINUED] amiodarone (PACERONE) 200 MG tablet Take 1 tablet (200 mg total) by mouth 2 (two) times daily. For 10 days, then decrease to 200 mg daily (Patient not  taking: Reported on 10/18/2014)  . [DISCONTINUED] furosemide (LASIX) 40 MG tablet Take 1 tablet (40 mg total) by mouth daily. For 7 Days (Patient not taking: Reported on 10/18/2014)  . [DISCONTINUED] omeprazole (PRILOSEC) 20 MG capsule TAKE 1 CAPSULE BY MOUTH 2 TIMES DAILY. (Patient not taking: Reported on 10/18/2014)  . [DISCONTINUED] potassium chloride SA (K-DUR,KLOR-CON) 20 MEQ tablet Take 1 tablet (20 mEq total) by mouth 2 (two) times daily. For 7 Days (Patient not taking: Reported on 10/18/2014)   No facility-administered encounter medications on file as of 10/18/2014.    Past Medical History  Diagnosis Date  . Coronary artery disease     a. PCI/DES to mLAD and dRCA in 2005, residual OM1 50%; b. nuclear stress test 02/2008: no ischemia; c. nuclear stress test 11/2009: mild ischemia in the inferior wall c/w previous stress test; d. cath 09/17/2014: critical ostial/prox LAD estimated at 90-95%, critical prox LCx, RCA w/ mild diff dz, EF >55%, no WMA  . Hyperlipidemia   . Hypertension   . Carotid artery stenosis     a. RICA 40%  . GERD (gastroesophageal reflux disease)   . PVD (peripheral vascular disease)     Past Surgical History  Procedure Laterality Date  . Cardiac catheterization    . Total knee arthroplasty      bilateral  . Cholecystectomy  2016  . Coronary artery bypass graft N/A 09/20/2014    Procedure: CORONARY ARTERY BYPASS GRAFTING (CABG) x   three using left internal mammary artery and right leg greater saphenous vein harvested endoscopically;  Surgeon: Melrose Nakayama, MD;  Location: [place];  Service: Open Heart Surgery;  Laterality: N/A;  . Tee without cardioversion N/A 09/20/2014    Procedure: TRANSESOPHAGEAL ECHOCARDIOGRAM (TEE);  Surgeon: Melrose Nakayama, MD;  Location: [place];  Service: Open Heart Surgery;  Laterality: N/A;    Social History  reports that he has quit smoking. He has never used smokeless tobacco. He reports that he does not drink alcohol or use  illicit drugs.  Family History family history includes Heart failure in his mother.       Review of Systems  Constitutional: Negative.   Respiratory: Negative.   Cardiovascular: Negative.   Gastrointestinal: Negative.   Musculoskeletal: Negative.        Chest tenderness around the incision site  Skin: Negative.   Neurological: Negative.   Hematological: Negative.   Psychiatric/Behavioral: Negative.   All other systems reviewed and are negative.   BP 132/62 mmHg  Pulse 53  Ht 5\' 11"  (1.803 m)  Wt 200 lb (90.719 kg)  BMI 27.91 kg/m2  Physical Exam  Constitutional: He is oriented to person, place, and time. He appears well-developed and well-nourished.  HENT:  Head: Normocephalic.  Nose: Nose normal.  Mouth/Throat: Oropharynx is clear and moist.  Eyes: Conjunctivae are normal. Pupils are equal, round, and reactive to light.  Neck: Normal range of motion. Neck supple. No JVD present.  Cardiovascular: Normal rate, regular rhythm, S1 normal, S2 normal, normal heart sounds and intact distal pulses.  Exam reveals no  gallop and no friction rub.   No murmur heard. Well healed mediastinal scar  Pulmonary/Chest: Effort normal and breath sounds normal. No respiratory distress. He has no wheezes. He has no rales. He exhibits no tenderness.  Abdominal: Soft. Bowel sounds are normal. He exhibits no distension. There is no tenderness.  Musculoskeletal: Normal range of motion. He exhibits no edema or tenderness.  Lymphadenopathy:    He has no cervical adenopathy.  Neurological: He is alert and oriented to person, place, and time. Coordination normal.  Skin: Skin is warm and dry. No rash noted. No erythema.  Psychiatric: He has a normal mood and affect. His behavior is normal. Judgment and thought content normal.      Assessment and Plan   Nursing note and vitals reviewed.

## 2014-10-29 ENCOUNTER — Ambulatory Visit: Payer: Medicare Other | Admitting: Thoracic Surgery (Cardiothoracic Vascular Surgery)

## 2014-11-01 ENCOUNTER — Other Ambulatory Visit: Payer: Self-pay | Admitting: Thoracic Surgery (Cardiothoracic Vascular Surgery)

## 2014-11-01 DIAGNOSIS — Z951 Presence of aortocoronary bypass graft: Secondary | ICD-10-CM

## 2014-11-04 ENCOUNTER — Ambulatory Visit (INDEPENDENT_AMBULATORY_CARE_PROVIDER_SITE_OTHER): Payer: Self-pay | Admitting: Physician Assistant

## 2014-11-04 ENCOUNTER — Ambulatory Visit
Admission: RE | Admit: 2014-11-04 | Discharge: 2014-11-04 | Disposition: A | Discharge status: Home / Self Care | Payer: Medicare Other | Source: Ambulatory Visit | Attending: Thoracic Surgery (Cardiothoracic Vascular Surgery) | Admitting: Thoracic Surgery (Cardiothoracic Vascular Surgery)

## 2014-11-04 VITALS — BP 127/68 | HR 62 | Resp 16 | Ht 71.0 in | Wt 200.0 lb

## 2014-11-04 DIAGNOSIS — Z951 Presence of aortocoronary bypass graft: Secondary | ICD-10-CM | POA: Diagnosis not present

## 2014-11-04 DIAGNOSIS — I25119 Atherosclerotic heart disease of native coronary artery with unspecified angina pectoris: Secondary | ICD-10-CM

## 2014-11-04 DIAGNOSIS — H40053 Ocular hypertension, bilateral: Secondary | ICD-10-CM | POA: Diagnosis not present

## 2014-11-04 MED ORDER — CLOPIDOGREL BISULFATE 75 MG PO TABS: 75.0000 mg | ORAL_TABLET | Freq: Every day | ORAL | Status: DC

## 2014-11-04 NOTE — Progress Notes (Signed)
  HPI: Patient returns for routine postoperative follow-up having undergone on CABG x 3 on 09/20/2014. The patient's early postoperative recovery while in the hospital was notable for Atrial Fibrillation for which he was started on Pradaxa.  Since hospital discharge the patient reports he is doing very well.  He denies chest pain and shortness of breath.  He does have some numbness down his right leg.  He states that he has had no further episodes of Atrial Fibrillation since hospitalization.  The patient is ambulating without difficulty.  His appetite and bowel habits are back to normal.  He does question if he can increase his activity level.   Current Outpatient Prescriptions  Medication Sig Dispense Refill  . aspirin 81 MG EC tablet Take 81 mg by mouth daily.      Marland Kitchen atorvastatin (LIPITOR) 20 MG tablet Take 1 tablet (20 mg total) by mouth daily. 90 tablet 3  . levobunolol (BETAGAN) 0.5 % ophthalmic solution Place 1 drop into both eyes every morning.  6  . metoprolol tartrate (LOPRESSOR) 25 MG tablet Take 1 tablet (25 mg total) by mouth 2 (two) times daily. 60 tablet 3  . Omega-3 Fatty Acids (FISH OIL) 1200 MG CAPS Take 1,200 mg by mouth daily.    Marland Kitchen omeprazole (PRILOSEC) 20 MG capsule Take 20 mg by mouth daily.    . clopidogrel (PLAVIX) 75 MG tablet Take 1 tablet (75 mg total) by mouth daily.     No current facility-administered medications for this visit.    Physical Exam:  BP 127/68 mmHg  Pulse 62  Resp 16  Ht 5\' 11"  (1.803 m)  Wt 200 lb (90.719 kg)  BMI 27.91 kg/m2  SpO2 98%  Gen: no apparent distress, looks great Heart:RRR, bradycardic Lungs: CTA Bilaterally Skin: incisions well healed, some hematoma/scar tissue from [facility] in [facility]  Diagnostic Tests:  CXR: post operative changes, no evidence of significant pleural effusion  A/P:  1. S/P CABG doing very well 2. Atrial Fibrillation- has been maintaining NSR since hospital discharge, will d/c Amiodarone and Pradaxa.  Per review  of Dr. Donivan Scull note patient was instructed to continue ASA and resume Plavix which patient has already at home 3. Right Leg Numbness- most likely residual nerve inflammation from catheterization, should improve with time 4. Activity- patient instructed to gradually increase activity.  He should not lift more than 10 lbs for another 2-4 weeks.  He was instructed that if his activity causes sternal discomfort he should stop and rest for 2 weeks prior to trying again 5. Dispo- patient looks great, RTC prn, follow up care per Dr. Philomena Doheny, PA-C [facility] [phone]

## 2014-11-12 DIAGNOSIS — D229 Melanocytic nevi, unspecified: Secondary | ICD-10-CM | POA: Diagnosis not present

## 2014-11-12 DIAGNOSIS — D485 Neoplasm of uncertain behavior of skin: Secondary | ICD-10-CM | POA: Diagnosis not present

## 2014-11-12 DIAGNOSIS — Z872 Personal history of diseases of the skin and subcutaneous tissue: Secondary | ICD-10-CM | POA: Diagnosis not present

## 2014-11-12 DIAGNOSIS — D220 Melanocytic nevi of lip: Secondary | ICD-10-CM | POA: Diagnosis not present

## 2014-11-19 ENCOUNTER — Ambulatory Visit (INDEPENDENT_AMBULATORY_CARE_PROVIDER_SITE_OTHER): Payer: Self-pay | Admitting: Thoracic Surgery (Cardiothoracic Vascular Surgery)

## 2014-11-19 ENCOUNTER — Encounter: Payer: Self-pay | Admitting: Thoracic Surgery (Cardiothoracic Vascular Surgery)

## 2014-11-19 VITALS — BP 135/80 | HR 59 | Resp 16 | Ht 71.0 in | Wt 200.0 lb

## 2014-11-19 DIAGNOSIS — I25119 Atherosclerotic heart disease of native coronary artery with unspecified angina pectoris: Secondary | ICD-10-CM

## 2014-11-19 DIAGNOSIS — Z951 Presence of aortocoronary bypass graft: Secondary | ICD-10-CM

## 2014-11-19 NOTE — Progress Notes (Signed)
[address]       ,[place] [id number]             [phone]       HPI:  Christopher [name] returns for a scheduled follow-up visit.  He is an 81 year old man who had coronary bypass grafting 3 on 09/20/2014. He had known 3 vessel disease and presented with a non-ST elevation MI. The night prior to surgery he went into rapid atrial fibrillation and pulmonary edema. He was really in pretty rough shape only took him to the operating room.  Postoperatively, he did remarkably well and went home on day 5. He says that for the first week after he was home he had a lot of coughing and couldn't sleep. Once that resolved he felt like he was making good progress.  He saw one of our PAs, Christopher [name], about 2 weeks ago. He was doing well at that time. His amiodarone and Pradaxa were discontinued. He was restarted on Plavix.  He says that he continues to get better. His exercise tolerance is dramatically improved. He does have any pain at rest but sometimes feels incisional pain when he will reach in a certain direction. He is able to lie flat on his back. He does not have any swelling in his legs.  Past Medical History  Diagnosis Date  . Coronary artery disease     a. PCI/DES to mLAD and dRCA in 2005, residual OM1 50%; b. nuclear stress test 02/2008: no ischemia; c. nuclear stress test 11/2009: mild ischemia in the inferior wall c/w previous stress test; d. cath 09/17/2014: critical ostial/prox LAD estimated at 90-95%, critical prox LCx, RCA w/ mild diff dz, EF >55%, no WMA  . Hyperlipidemia   . Hypertension   . Carotid artery stenosis     a. RICA 40%  . GERD (gastroesophageal reflux disease)   . PVD (peripheral vascular disease)       Current Outpatient Prescriptions  Medication Sig Dispense Refill  . aspirin 81 MG EC tablet Take 81 mg by mouth daily.      Marland Kitchen atorvastatin (LIPITOR) 20 MG tablet Take 1 tablet (20 mg total) by mouth daily. 90 tablet 3  . clopidogrel (PLAVIX) 75  MG tablet Take 1 tablet (75 mg total) by mouth daily.    Marland Kitchen levobunolol (BETAGAN) 0.5 % ophthalmic solution Place 1 drop into both eyes every morning.  6  . metoprolol tartrate (LOPRESSOR) 25 MG tablet Take 1 tablet (25 mg total) by mouth 2 (two) times daily. 60 tablet 3  . Omega-3 Fatty Acids (FISH OIL) 1200 MG CAPS Take 1,200 mg by mouth daily.    Marland Kitchen omeprazole (PRILOSEC) 20 MG capsule Take 20 mg by mouth daily.     No current facility-administered medications for this visit.    Physical Exam BP 135/80 mmHg  Pulse 59  Resp 16  Ht 5\' 11"  (1.803 m)  Wt 200 lb (90.719 kg)  BMI 27.91 kg/m2  SpO31 66% 80 year old man in no acute distress Well-developed and well-nourished Alert and oriented 3 with no neurologic deficits Cardiac regular rate and rhythm, rare early beat, no rub Lungs clear with equal breath sounds bilaterally Sternum stable Incisions healing well No peripheral edema  Diagnostic Tests: none  Impression: 81 year old man who had coronary bypass grafting 3 about 2 months ago. He had rapid atrial fibrillation and flash pulmonary edema the night prior to surgery. Despite that he recovered remarkably well postoperatively. He now is  making excellent progress. He is pretty much returned to full activities although there are a few things he will not yet do because of concern for pain.   He has been driving without any issues.  He has not had any recurrence of atrial fibrillation since his amiodarone and Pradaxa were discontinued  Plan: He will follow-up with Dr. Rockey Situ.  I will be happy to see him back any time for could be of any further assistance with his care.  Christopher Nakayama, MD [facility] [phone]

## 2015-01-15 ENCOUNTER — Other Ambulatory Visit: Payer: Self-pay | Admitting: Cardiovascular Disease

## 2015-01-20 ENCOUNTER — Encounter: Payer: Self-pay | Admitting: Cardiovascular Disease

## 2015-01-20 ENCOUNTER — Ambulatory Visit (INDEPENDENT_AMBULATORY_CARE_PROVIDER_SITE_OTHER): Payer: Medicare Other | Admitting: Cardiovascular Disease

## 2015-01-20 VITALS — BP 138/76 | HR 59 | Ht 71.0 in | Wt 209.8 lb

## 2015-01-20 DIAGNOSIS — E782 Mixed hyperlipidemia: Secondary | ICD-10-CM | POA: Diagnosis not present

## 2015-01-20 DIAGNOSIS — Z951 Presence of aortocoronary bypass graft: Secondary | ICD-10-CM | POA: Diagnosis not present

## 2015-01-20 DIAGNOSIS — I214 Non-ST elevation (NSTEMI) myocardial infarction: Secondary | ICD-10-CM | POA: Diagnosis not present

## 2015-01-20 DIAGNOSIS — I2510 Atherosclerotic heart disease of native coronary artery without angina pectoris: Secondary | ICD-10-CM

## 2015-01-20 DIAGNOSIS — I4891 Unspecified atrial fibrillation: Secondary | ICD-10-CM

## 2015-01-20 NOTE — Assessment & Plan Note (Signed)
Cholesterol is at goal on the current lipid regimen. No changes to the medications were made.  

## 2015-01-20 NOTE — Assessment & Plan Note (Signed)
Maintaining normal sinus rhythm. No changes to his medications. 

## 2015-01-20 NOTE — Assessment & Plan Note (Signed)
Currently with no symptoms of angina. No further workup at this time. Continue current medication regimen. 

## 2015-01-20 NOTE — Patient Instructions (Signed)
You are doing well. No medication changes were made.  Please call us if you have new issues that need to be addressed before your next appt.  Your physician wants you to follow-up in: 6 months.  You will receive a reminder letter in the mail two months in advance. If you don't receive a letter, please call our office to schedule the follow-up appointment.   

## 2015-01-20 NOTE — Assessment & Plan Note (Signed)
Doing well following his bypass surgery. Recovered well Recommended a regular exercise program

## 2015-01-20 NOTE — Progress Notes (Signed)
Patient ID: Christopher [name], male    DOB: 06/21/1931, 81 y.o.   MRN: [id number]  HPI Comments: Mr. Agrusa is a very pleasant 81 year old gentleman with a history of coronary artery disease, stent placed to his distal RCA in 2005, stent placed to his mid LAD, recent catheterization 09/17/2014 showing critical ostial LAD and proximal left circumflex disease, transferred to [facility] for bypass surgery, who presents today for follow-up of his cad and  three-vessel CABG.  In follow-up he reports that he feels well. He is taking aspirin and Plavix. Denies episodes of atrial fibrillation Postoperatively had atrial fibrillation, started on anticoagulation and amiodarone Reports his strength is back. Continues to take care of his wife who has Alzheimer's Reports having numbness down his left arm at times No regular exercise program  EKG on today's visit shows sinus bradycardia with rate 59 bpm, nonspecific ST abnormality  Other past medical history mild carotid plaquing with mixed irregular plaque estimated at 40% on the right,  Continued significant stress in his life as his wife has dementia.    History of total knee replacement with rehabilitation at [facility]. At the rehabilitation, he was receiving atorvastatin 80 mg daily with Vytorin (dose unclear). He became very ill, had chills, rigors, sweating, urine was very dark. He was noticed to be jaundice and checked himself out of rehabilitation and went to the hospital, noted to have hepatitis. Symptoms resolved by holding the statin's. LFTs improved back to normal  Lipitor was restarted at low dose 20 mg   Prior episode of severe dizziness. He was driving at the time and had to pullover. Symptoms resolved without intervention. That night he developed upper respiratory infection with runny nose. Currently has severe cough and  unable to sleep. Additional mild episode of dizziness several days later. No further episodes since then  Cardiac catheter  September 2005 shows 90% mid LAD, 80% distal RCA, 50% OM1 at the mid vessel, Taxus stent 2.75 x 24 mm stent to his LAD, TAxus stent 2.5 x 16 mm stent to the RCA.   stress test in July 2011 showed mild ischemia in the inferior wall consistent with previous stress test      No Known Allergies  Outpatient Encounter Prescriptions as of 01/20/2015  Medication Sig  . aspirin 81 MG EC tablet Take 81 mg by mouth daily.    Marland Kitchen atorvastatin (LIPITOR) 20 MG tablet Take 1 tablet (20 mg total) by mouth daily.  . clopidogrel (PLAVIX) 75 MG tablet Take 1 tablet (75 mg total) by mouth daily.  Marland Kitchen levobunolol (BETAGAN) 0.5 % ophthalmic solution Place 1 drop into both eyes every morning.  . metoprolol tartrate (LOPRESSOR) 25 MG tablet Take 1 tablet (25 mg total) by mouth 2 (two) times daily.  . Omega-3 Fatty Acids (FISH OIL) 1200 MG CAPS Take 1,200 mg by mouth daily.  Marland Kitchen omeprazole (PRILOSEC) 20 MG capsule Take 20 mg by mouth daily.  . [DISCONTINUED] omeprazole (PRILOSEC) 20 MG capsule TAKE 1 CAPSULE BY MOUTH 2 TIMES DAILY. (Patient not taking: Reported on 01/20/2015)   No facility-administered encounter medications on file as of 01/20/2015.    Past Medical History  Diagnosis Date  . Coronary artery disease     a. PCI/DES to mLAD and dRCA in 2005, residual OM1 50%; b. nuclear stress test 02/2008: no ischemia; c. nuclear stress test 11/2009: mild ischemia in the inferior wall c/w previous stress test; d. cath 09/17/2014: critical ostial/prox LAD estimated at 90-95%, critical prox LCx, RCA  w/ mild diff dz, EF >55%, no WMA  . Hyperlipidemia   . Hypertension   . Carotid artery stenosis     a. RICA 40%  . GERD (gastroesophageal reflux disease)   . PVD (peripheral vascular disease)     Past Surgical History  Procedure Laterality Date  . Cardiac catheterization    . Total knee arthroplasty      bilateral  . Cholecystectomy  2016  . Coronary artery bypass graft N/A 09/20/2014    Procedure: CORONARY ARTERY  BYPASS GRAFTING (CABG) x   three using left internal mammary artery and right leg greater saphenous vein harvested endoscopically;  Surgeon: Melrose Nakayama, MD;  Location: [place];  Service: Open Heart Surgery;  Laterality: N/A;  . Tee without cardioversion N/A 09/20/2014    Procedure: TRANSESOPHAGEAL ECHOCARDIOGRAM (TEE);  Surgeon: Melrose Nakayama, MD;  Location: [place];  Service: Open Heart Surgery;  Laterality: N/A;    Social History  reports that he has quit smoking. He has never used smokeless tobacco. He reports that he does not drink alcohol or use illicit drugs.  Family History family history includes Heart failure in his mother.       Review of Systems  Constitutional: Negative.   Respiratory: Negative.   Cardiovascular: Negative.   Gastrointestinal: Negative.   Musculoskeletal: Negative.   Skin: Negative.   Neurological: Negative.   Hematological: Negative.   Psychiatric/Behavioral: Negative.   All other systems reviewed and are negative.   BP 138/76 mmHg  Pulse 59  Ht 5\' 11"  (1.803 m)  Wt 209 lb 12 oz (95.142 kg)  BMI 29.27 kg/m2  Physical Exam  Constitutional: He is oriented to person, place, and time. He appears well-developed and well-nourished.  HENT:  Head: Normocephalic.  Nose: Nose normal.  Mouth/Throat: Oropharynx is clear and moist.  Eyes: Conjunctivae are normal. Pupils are equal, round, and reactive to light.  Neck: Normal range of motion. Neck supple. No JVD present.  Cardiovascular: Normal rate, regular rhythm, S1 normal, S2 normal, normal heart sounds and intact distal pulses.  Exam reveals no gallop and no friction rub.   No murmur heard. Well healed mediastinal scar  Pulmonary/Chest: Effort normal and breath sounds normal. No respiratory distress. He has no wheezes. He has no rales. He exhibits no tenderness.  Abdominal: Soft. Bowel sounds are normal. He exhibits no distension. There is no tenderness.  Musculoskeletal: Normal range of  motion. He exhibits no edema or tenderness.  Lymphadenopathy:    He has no cervical adenopathy.  Neurological: He is alert and oriented to person, place, and time. Coordination normal.  Skin: Skin is warm and dry. No rash noted. No erythema.  Psychiatric: He has a normal mood and affect. His behavior is normal. Judgment and thought content normal.      Assessment and Plan   Nursing note and vitals reviewed.

## 2015-02-28 DIAGNOSIS — E785 Hyperlipidemia, unspecified: Secondary | ICD-10-CM | POA: Diagnosis not present

## 2015-02-28 DIAGNOSIS — E119 Type 2 diabetes mellitus without complications: Secondary | ICD-10-CM | POA: Diagnosis not present

## 2015-02-28 DIAGNOSIS — E041 Nontoxic single thyroid nodule: Secondary | ICD-10-CM | POA: Diagnosis not present

## 2015-02-28 DIAGNOSIS — I10 Essential (primary) hypertension: Secondary | ICD-10-CM | POA: Diagnosis not present

## 2015-03-07 DIAGNOSIS — I10 Essential (primary) hypertension: Secondary | ICD-10-CM | POA: Diagnosis not present

## 2015-03-07 DIAGNOSIS — Z23 Encounter for immunization: Secondary | ICD-10-CM | POA: Diagnosis not present

## 2015-03-07 DIAGNOSIS — E119 Type 2 diabetes mellitus without complications: Secondary | ICD-10-CM | POA: Diagnosis not present

## 2015-03-07 DIAGNOSIS — E785 Hyperlipidemia, unspecified: Secondary | ICD-10-CM | POA: Diagnosis not present

## 2015-03-07 DIAGNOSIS — I2510 Atherosclerotic heart disease of native coronary artery without angina pectoris: Secondary | ICD-10-CM | POA: Diagnosis not present

## 2015-03-26 DIAGNOSIS — L570 Actinic keratosis: Secondary | ICD-10-CM | POA: Diagnosis not present

## 2015-05-10 ENCOUNTER — Ambulatory Visit
Admission: EM | Admit: 2015-05-10 | Discharge: 2015-05-10 | Disposition: A | Discharge status: Home / Self Care | Payer: Medicare Other | Attending: Family Medicine | Admitting: Family Medicine

## 2015-05-10 ENCOUNTER — Encounter: Payer: Self-pay | Admitting: Gynecology

## 2015-05-10 DIAGNOSIS — J0100 Acute maxillary sinusitis, unspecified: Secondary | ICD-10-CM | POA: Diagnosis not present

## 2015-05-10 DIAGNOSIS — F5102 Adjustment insomnia: Secondary | ICD-10-CM | POA: Diagnosis not present

## 2015-05-10 DIAGNOSIS — H6593 Unspecified nonsuppurative otitis media, bilateral: Secondary | ICD-10-CM | POA: Diagnosis not present

## 2015-05-10 MED ORDER — BENZONATATE 100 MG PO CAPS: 100.0000 mg | ORAL_CAPSULE | Freq: Three times a day (TID) | ORAL | Status: DC | PRN

## 2015-05-10 MED ORDER — DOXYCYCLINE HYCLATE 100 MG PO TABS: 100.0000 mg | ORAL_TABLET | Freq: Two times a day (BID) | ORAL | Status: AC

## 2015-05-10 MED ORDER — MELATONIN 1 MG PO TABS: 2.0000 mg | ORAL_TABLET | Freq: Every evening | ORAL | Status: AC | PRN

## 2015-05-10 MED ORDER — SALINE SPRAY 0.65 % NA SOLN: 2.0000 | NASAL | Status: DC

## 2015-05-10 MED ORDER — FLUTICASONE PROPIONATE 50 MCG/ACT NA SUSP: 1.0000 | Freq: Two times a day (BID) | NASAL | Status: DC

## 2015-05-10 NOTE — Discharge Instructions (Signed)
Acute Bronchitis °Bronchitis is inflammation of the airways that extend from the windpipe into the lungs (bronchi). The inflammation often causes mucus to develop. This leads to a cough, which is the most common symptom of bronchitis.  °In acute bronchitis, the condition usually develops suddenly and goes away over time, usually in a couple weeks. Smoking, allergies, and asthma can make bronchitis worse. Repeated episodes of bronchitis may cause further lung problems.  °CAUSES °Acute bronchitis is most often caused by the same virus that causes a cold. The virus can spread from person to person (contagious) through coughing, sneezing, and touching contaminated objects. °SIGNS AND SYMPTOMS  °· Cough.   °· Fever.   °· Coughing up mucus.   °· Body aches.   °· Chest congestion.   °· Chills.   °· Shortness of breath.   °· Sore throat.   °DIAGNOSIS  °Acute bronchitis is usually diagnosed through a physical exam. Your health care provider will also ask you questions about your medical history. Tests, such as chest X-rays, are sometimes done to rule out other conditions.  °TREATMENT  °Acute bronchitis usually goes away in a couple weeks. Oftentimes, no medical treatment is necessary. Medicines are sometimes given for relief of fever or cough. Antibiotic medicines are usually not needed but may be prescribed in certain situations. In some cases, an inhaler may be recommended to help reduce shortness of breath and control the cough. A cool mist vaporizer may also be used to help thin bronchial secretions and make it easier to clear the chest.  °HOME CARE INSTRUCTIONS °· Get plenty of rest.   °· Drink enough fluids to keep your urine clear or pale yellow (unless you have a medical condition that requires fluid restriction). Increasing fluids may help thin your respiratory secretions (sputum) and reduce chest congestion, and it will prevent dehydration.   °· Take medicines only as directed by your health care provider. °· If  you were prescribed an antibiotic medicine, finish it all even if you start to feel better. °· Avoid smoking and secondhand smoke. Exposure to cigarette smoke or irritating chemicals will make bronchitis worse. If you are a smoker, consider using nicotine gum or skin patches to help control withdrawal symptoms. Quitting smoking will help your lungs heal faster.   °· Reduce the chances of another bout of acute bronchitis by washing your hands frequently, avoiding people with cold symptoms, and trying not to touch your hands to your mouth, nose, or eyes.   °· Keep all follow-up visits as directed by your health care provider.   °SEEK MEDICAL CARE IF: °Your symptoms do not improve after 1 week of treatment.  °SEEK IMMEDIATE MEDICAL CARE IF: °· You develop an increased fever or chills.   °· You have chest pain.   °· You have severe shortness of breath. °· You have bloody sputum.   °· You develop dehydration. °· You faint or repeatedly feel like you are going to pass out. °· You develop repeated vomiting. °· You develop a severe headache. °MAKE SURE YOU:  °· Understand these instructions. °· Will watch your condition. °· Will get help right away if you are not doing well or get worse. °  °This information is not intended to replace advice given to you by your health care provider. Make sure you discuss any questions you have with your health care provider. °  °Document Released: 06/24/2004 Document Revised: 06/07/2014 Document Reviewed: 11/07/2012 °Elsevier Interactive Patient Education ©2016 [employer] °Otitis Media With Effusion °Otitis media with effusion is the presence of fluid in the middle ear. This is   a common problem in children, which often follows ear infections. It may be present for weeks or longer after the infection. Unlike an acute ear infection, otitis media with effusion refers only to fluid behind the ear drum and not infection. Children with repeated ear and sinus infections and allergy problems  are the most likely to get otitis media with effusion. CAUSES  The most frequent cause of the fluid buildup is dysfunction of the eustachian tubes. These are the tubes that drain fluid in the ears to the back of the nose (nasopharynx). SYMPTOMS   The main symptom of this condition is hearing loss. As a result, you or your child may:  Listen to the TV at a loud volume.  Not respond to questions.  Ask "what" often when spoken to.  Mistake or confuse one sound or word for another.  There may be a sensation of fullness or pressure but usually not pain. DIAGNOSIS   Your health care provider will diagnose this condition by examining you or your child's ears.  Your health care provider may test the pressure in you or your child's ear with a tympanometer.  A hearing test may be conducted if the problem persists. TREATMENT   Treatment depends on the duration and the effects of the effusion.  Antibiotics, decongestants, nose drops, and cortisone-type drugs (tablets or nasal spray) may not be helpful.  Children with persistent ear effusions may have delayed language or behavioral problems. Children at risk for developmental delays in hearing, learning, and speech may require referral to a specialist earlier than children not at risk.  You or your child's health care provider may suggest a referral to an ear, nose, and throat surgeon for treatment. The following may help restore normal hearing:  Drainage of fluid.  Placement of ear tubes (tympanostomy tubes).  Removal of adenoids (adenoidectomy). HOME CARE INSTRUCTIONS   Avoid secondhand smoke.  Infants who are breastfed are less likely to have this condition.  Avoid feeding infants while they are lying flat.  Avoid known environmental allergens.  Avoid people who are sick. SEEK MEDICAL CARE IF:   Hearing is not better in 3 months.  Hearing is worse.  Ear pain.  Drainage from the ear.  Dizziness. MAKE SURE YOU:    Understand these instructions.  Will watch your condition.  Will get help right away if you are not doing well or get worse.   This information is not intended to replace advice given to you by your health care provider. Make sure you discuss any questions you have with your health care provider.   Document Released: 06/24/2004 Document Revised: 06/07/2014 Document Reviewed: 12/12/2012 Elsevier Interactive Patient Education 2016 [employer]. Complicated Grieving Grief is a normal response to the death of someone close to you. Feelings of fear, anger, and guilt can affect almost everyone who loses a loved one. It is also common to have symptoms of depression while you are grieving. These include problems with sleep, loss of appetite, and lack of energy. They may last for weeks or months after a loss. Complicated grief is different from normal grief or depression. Normal grieving involves sadness and feelings of loss, but these feelings are not constant. Complicated grief is a constant and severe type of grief. It interferes with your ability to function normally. It may last for several months to a year or longer. Complicated grief may require treatment from a mental health care provider. CAUSES  It is not known why some people continue  to struggle with grief and others do not. You may be at higher risk for complicated grief if:  The death of your loved one was sudden or unexpected.  The death of your loved one was due to a violent event.  Your loved one committed suicide.  Your loved one was a child or a young person.  You were very close to or dependent on the loved one.  You have a history of depression. SIGNS AND SYMPTOMS Signs and symptoms of complicated grief may include:  Feeling disbelief or numbness.  Being unable to enjoy good memories of your loved one.  Needing to avoid anything that reminds you of your loved one.  Being unable to stop thinking about the  death.  Feeling intense anger or guilt.  Feeling alone and hopeless.  Feeling that your life is meaningless and empty.  Losing the desire to live. DIAGNOSIS Your health care provider may diagnose complicated grief if:  You have constant symptoms of grief for 6-12 months or longer.  Your symptoms are interfering with your ability to live your life. Your health care provider may want you to see a mental health care provider. Many symptoms of depression are similar to the symptoms of complicated grief. It is important to be evaluated for complicated grief along with other mental health conditions. TREATMENT  Talk therapy with a mental health provider is the most common treatment for complicated grief. During therapy, you will learn healthy ways to cope with the loss of your loved one. In some cases, your mental health care provider may also recommend antidepressant medicines. HOME CARE INSTRUCTIONS  Take care of yourself.  Eat regular meals and maintain a healthy diet. Eat plenty of fruits, vegetables, and whole grains.  Try to get some exercise each day.  Keep regular hours for sleep. Try to get at least 8 hours of sleep each night.  Do not use drugs or alcohol to ease your symptoms.  Take medicines only as directed by your health care provider.  Spend time with friends and loved ones.  Consider joining a grief (bereavement) support group to help you deal with your loss.  Keep all follow-up visits as directed by your health care provider. This is important. SEEK MEDICAL CARE IF:  Your symptoms keep you from functioning normally.  Your symptoms do not get better with treatment. SEEK IMMEDIATE MEDICAL CARE IF:  You have serious thoughts of hurting yourself or someone else.  You have suicidal feelings.   This information is not intended to replace advice given to you by your health care provider. Make sure you discuss any questions you have with your health care provider.    Document Released: 05/17/2005 Document Revised: 02/05/2015 Document Reviewed: 10/25/2013 Elsevier Interactive Patient Education 2016 [employer] Inc[employer] Insomnia Insomnia is a sleep disorder that makes it difficult to fall asleep or to stay asleep. Insomnia can cause tiredness (fatigue), low energy, difficulty concentrating, mood swings, and poor performance at work or school.  There are three different ways to classify insomnia:  Difficulty falling asleep.  Difficulty staying asleep.  Waking up too early in the morning. Any type of insomnia can be long-term (chronic) or short-term (acute). Both are common. Short-term insomnia usually lasts for three months or less. Chronic insomnia occurs at least three times a week for longer than three months. CAUSES  Insomnia may be caused by another condition, situation, or substance, such as:  Anxiety.  Certain medicines.  Gastroesophageal reflux disease (GERD) or other gastrointestinal  conditions.  Asthma or other breathing conditions.  Restless legs syndrome, sleep apnea, or other sleep disorders.  Chronic pain.  Menopause. This may include hot flashes.  Stroke.  Abuse of alcohol, tobacco, or illegal drugs.  Depression.  Caffeine.   Neurological disorders, such as Alzheimer disease.  An overactive thyroid (hyperthyroidism). The cause of insomnia may not be known. RISK FACTORS Risk factors for insomnia include:  Gender. Women are more commonly affected than men.  Age. Insomnia is more common as you get older.  Stress. This may involve your professional or personal life.  Income. Insomnia is more common in people with lower income.  Lack of exercise.   Irregular work schedule or night shifts.  Traveling between different time zones. SIGNS AND SYMPTOMS If you have insomnia, trouble falling asleep or trouble staying asleep is the main symptom. This may lead to other symptoms, such as:  Feeling fatigued.  Feeling  nervous about going to sleep.  Not feeling rested in the morning.  Having trouble concentrating.  Feeling irritable, anxious, or depressed. TREATMENT  Treatment for insomnia depends on the cause. If your insomnia is caused by an underlying condition, treatment will focus on addressing the condition. Treatment may also include:   Medicines to help you sleep.  Counseling or therapy.  Lifestyle adjustments. HOME CARE INSTRUCTIONS   Take medicines only as directed by your health care provider.  Keep regular sleeping and waking hours. Avoid naps.  Keep a sleep diary to help you and your health care provider figure out what could be causing your insomnia. Include:   When you sleep.  When you wake up during the night.  How well you sleep.   How rested you feel the next day.  Any side effects of medicines you are taking.  What you eat and drink.   Make your bedroom a comfortable place where it is easy to fall asleep:  Put up shades or special blackout curtains to block light from outside.  Use a white noise machine to block noise.  Keep the temperature cool.   Exercise regularly as directed by your health care provider. Avoid exercising right before bedtime.  Use relaxation techniques to manage stress. Ask your health care provider to suggest some techniques that may work well for you. These may include:  Breathing exercises.  Routines to release muscle tension.  Visualizing peaceful scenes.  Cut back on alcohol, caffeinated beverages, and cigarettes, especially close to bedtime. These can disrupt your sleep.  Do not overeat or eat spicy foods right before bedtime. This can lead to digestive discomfort that can make it hard for you to sleep.  Limit screen use before bedtime. This includes:  Watching TV.  Using your smartphone, tablet, and computer.  Stick to a routine. This can help you fall asleep faster. Try to do a quiet activity, brush your teeth, and go  to bed at the same time each night.  Get out of bed if you are still awake after 15 minutes of trying to sleep. Keep the lights down, but try reading or doing a quiet activity. When you feel sleepy, go back to bed.  Make sure that you drive carefully. Avoid driving if you feel very sleepy.  Keep all follow-up appointments as directed by your health care provider. This is important. SEEK MEDICAL CARE IF:   You are tired throughout the day or have trouble in your daily routine due to sleepiness.  You continue to have sleep problems or your sleep  problems get worse. SEEK IMMEDIATE MEDICAL CARE IF:   You have serious thoughts about hurting yourself or someone else.   This information is not intended to replace advice given to you by your health care provider. Make sure you discuss any questions you have with your health care provider.   Document Released: 05/14/2000 Document Revised: 02/05/2015 Document Reviewed: 02/15/2014 Elsevier Interactive Patient Education 2016 Else[employer] Inc. Sinusitis, Adult Sinusitis is redness, soreness, and inflammation of the paranasal sinuses. Paranasal sinuses are air pockets within the bones of your face. They are located beneath your eyes, in the middle of your forehead, and above your eyes. In healthy paranasal sinuses, mucus is able to drain out, and air is able to circulate through them by way of your nose. However, when your paranasal sinuses are inflamed, mucus and air can become trapped. This can allow bacteria and other germs to grow and cause infection. Sinusitis can develop quickly and last only a short time (acute) or continue over a long period (chronic). Sinusitis that lasts for more than 12 weeks is considered chronic. CAUSES Causes of sinusitis include:  Allergies.  Structural abnormalities, such as displacement of the cartilage that separates your nostrils (deviated septum), which can decrease the air flow through your nose and sinuses and affect  sinus drainage.  Functional abnormalities, such as when the small hairs (cilia) that line your sinuses and help remove mucus do not work properly or are not present. SIGNS AND SYMPTOMS Symptoms of acute and chronic sinusitis are the same. The primary symptoms are pain and pressure around the affected sinuses. Other symptoms include:  Upper toothache.  Earache.  Headache.  Bad breath.  Decreased sense of smell and taste.  A cough, which worsens when you are lying flat.  Fatigue.  Fever.  Thick drainage from your nose, which often is green and may contain pus (purulent).  Swelling and warmth over the affected sinuses. DIAGNOSIS Your health care provider will perform a physical exam. During your exam, your health care provider may perform any of the following to help determine if you have acute sinusitis or chronic sinusitis:  Look in your nose for signs of abnormal growths in your nostrils (nasal polyps).  Tap over the affected sinus to check for signs of infection.  View the inside of your sinuses using an imaging device that has a light attached (endoscope). If your health care provider suspects that you have chronic sinusitis, one or more of the following tests may be recommended:  Allergy tests.  Nasal culture. A sample of mucus is taken from your nose, sent to a lab, and screened for bacteria.  Nasal cytology. A sample of mucus is taken from your nose and examined by your health care provider to determine if your sinusitis is related to an allergy. TREATMENT Most cases of acute sinusitis are related to a viral infection and will resolve on their own within 10 days. Sometimes, medicines are prescribed to help relieve symptoms of both acute and chronic sinusitis. These may include pain medicines, decongestants, nasal steroid sprays, or saline sprays. However, for sinusitis related to a bacterial infection, your health care provider will prescribe antibiotic medicines. These  are medicines that will help kill the bacteria causing the infection. Rarely, sinusitis is caused by a fungal infection. In these cases, your health care provider will prescribe antifungal medicine. For some cases of chronic sinusitis, surgery is needed. Generally, these are cases in which sinusitis recurs more than 3 times per year, despite other  treatments. HOME CARE INSTRUCTIONS  Drink plenty of water. Water helps thin the mucus so your sinuses can drain more easily.  Use a humidifier.  Inhale steam 3-4 times a day (for example, sit in the bathroom with the shower running).  Apply a warm, moist washcloth to your face 3-4 times a day, or as directed by your health care provider.  Use saline nasal sprays to help moisten and clean your sinuses.  Take medicines only as directed by your health care provider.  If you were prescribed either an antibiotic or antifungal medicine, finish it all even if you start to feel better. SEEK IMMEDIATE MEDICAL CARE IF:  You have increasing pain or severe headaches.  You have nausea, vomiting, or drowsiness.  You have swelling around your face.  You have vision problems.  You have a stiff neck.  You have difficulty breathing.   This information is not intended to replace advice given to you by your health care provider. Make sure you discuss any questions you have with your health care provider.   Document Released: 05/17/2005 Document Revised: 06/07/2014 Document Reviewed: 06/01/2011 Elsevier Interactive Patient Education [employer].

## 2015-05-10 NOTE — ED Notes (Signed)
Patient c/o cough / congestion x 1 week.

## 2015-05-11 NOTE — ED Provider Notes (Signed)
CSN: [id number]     Arrival date & time 05/10/15  [id number] History   First MD Initiated Contact with Patient 05/10/15 1019     Chief Complaint  Patient presents with  . Cough   (Consider location/radiation/quality/duration/timing/severity/associated sxs/prior Treatment) HPI Comments: Widowed caucasian male here for evaluation nonproductive cough, chest and nasal congestion, "I feel a rattle in my chest at night, hoarse voice Incision from cabg sore from coughing, insomnia difficulty staying asleep light sleeper still waking up worrying that his wife needs help in the middle of the night as he has been caregiver but she passed away in 2023-04-08.  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 male presenting with cough. The history is provided by the patient.  Cough Cough characteristics:  Non-productive Severity:  Moderate Onset quality:  Sudden Duration:  1 week Timing:  Intermittent Progression:  Worsening Chronicity:  New Smoker: no   Context: upper respiratory infection, weather changes and with activity   Context: not animal exposure, not exposure to allergens, not fumes, not occupational exposure, not sick contacts and not smoke exposure   Relieved by:  Nothing Worsened by:  Deep breathing, exposure to cold air, lying down, environmental changes and activity Ineffective treatments:  None tried Associated symptoms: rhinorrhea and sinus congestion   Associated symptoms: no chest pain, no chills, no diaphoresis, no ear fullness, no ear pain, no eye discharge, no fever, no headaches, no myalgias, no rash, no shortness of breath, no sore throat, no weight loss and no wheezing     Past Medical History  Diagnosis Date  . Coronary artery disease     a. PCI/DES to mLAD  and dRCA in 2005, residual OM1 50%; b. nuclear stress test 02/2008: no ischemia; c. nuclear stress test 11/2009: mild ischemia in the inferior wall c/w previous stress test; d. cath 09/17/2014: critical ostial/prox LAD estimated at 90-95%, critical prox LCx, RCA w/ mild diff dz, EF >55%, no WMA  . Hyperlipidemia   . Hypertension   . Carotid artery stenosis     a. RICA 40%  . GERD (gastroesophageal reflux disease)   . PVD (peripheral vascular disease) [facility])    Past Surgical History  Procedure Laterality Date  . Cardiac catheterization    . Total knee arthroplasty      bilateral  . Cholecystectomy  2016  . Coronary artery bypass graft N/A 09/20/2014    Procedure: CORONARY ARTERY BYPASS GRAFTING (CABG) x   three using left internal mammary artery and right leg greater saphenous vein harvested endoscopically;  Surgeon: Melrose Nakayama, MD;  Location: [place];  Service: Open Heart Surgery;  Laterality: N/A;  . Tee without cardioversion N/A 09/20/2014    Procedure: TRANSESOPHAGEAL ECHOCARDIOGRAM (TEE);  Surgeon: Melrose Nakayama, MD;  Location: [place];  Service: Open Heart Surgery;  Laterality: N/A;   Family History  Problem Relation Age of Onset  . Heart failure Mother    Social History  Substance Use Topics  . Smoking status: Former Research scientist (life sciences)  . Smokeless tobacco: Never Used  . Alcohol Use: No    Review of Systems  Constitutional: Negative for fever, chills, weight loss, diaphoresis, activity change, appetite change, fatigue and unexpected weight change.  HENT: Positive for congestion, postnasal drip, rhinorrhea and sinus pressure. Negative  for dental problem, drooling, ear discharge, ear pain, facial swelling, hearing loss, mouth sores, nosebleeds, sneezing, sore throat, tinnitus, trouble swallowing and voice change.   Eyes: Negative for photophobia, pain, discharge, redness, itching and visual disturbance.  Respiratory: Positive for cough. Negative for choking, chest tightness,  shortness of breath, wheezing and stridor.   Cardiovascular: Negative for chest pain, palpitations and leg swelling.  Gastrointestinal: Negative for nausea, vomiting, abdominal pain, diarrhea, constipation, blood in stool and abdominal distention.  Endocrine: Negative for cold intolerance and heat intolerance.  Genitourinary: Negative for dysuria.  Musculoskeletal: Negative for myalgias, back pain, joint swelling, arthralgias, gait problem, neck pain and neck stiffness.  Skin: Negative for color change, pallor, rash and wound.  Allergic/Immunologic: Positive for environmental allergies. Negative for food allergies and immunocompromised state.  Neurological: Negative for dizziness, tremors, seizures, syncope, facial asymmetry, speech difficulty, weakness, light-headedness, numbness and headaches.  Hematological: Negative for adenopathy. Does not bruise/bleed easily.  Psychiatric/Behavioral: Negative for behavioral problems, confusion, sleep disturbance and agitation.    Allergies  Review of patient's allergies indicates no known allergies.  Home Medications   Prior to Admission medications   Medication Sig Start Date End Date Taking? Authorizing Provider  aspirin 81 MG EC tablet Take 81 mg by mouth daily.     Yes Historical Provider, MD  atorvastatin (LIPITOR) 20 MG tablet Take 1 tablet (20 mg total) by mouth daily. 10/18/14  Yes Minna Merritts, MD  clopidogrel (PLAVIX) 75 MG tablet Take 1 tablet (75 mg total) by mouth daily. 11/04/14  Yes Erin R Barrett, PA-C  levobunolol (BETAGAN) 0.5 % ophthalmic solution Place 1 drop into both eyes every morning. 07/29/14  Yes Historical Provider, MD  metoprolol tartrate (LOPRESSOR) 25 MG tablet Take 1 tablet (25 mg total) by mouth 2 (two) times daily. 09/26/14  Yes Erin R Barrett, PA-C  Omega-3 Fatty Acids (FISH OIL) 1200 MG CAPS Take 1,200 mg by mouth daily.   Yes Historical Provider, MD  omeprazole (PRILOSEC) 20 MG capsule Take 20 mg by mouth daily.    Yes Historical Provider, MD  benzonatate (TESSALON) 100 MG capsule Take 1 capsule (100 mg total) by mouth 3 (three) times daily as needed for cough. 05/10/15   Olen Cordial, NP  doxycycline (VIBRA-TABS) 100 MG tablet Take 1 tablet (100 mg total) by mouth 2 (two) times daily. 05/12/15 05/22/15  Olen Cordial, NP  fluticasone (FLONASE) 50 MCG/ACT nasal spray Place 1 spray into both nostrils 2 (two) times daily. 05/10/15   Olen Cordial, NP  Melatonin 1 MG TABS Take 2 tablets (2 mg total) by mouth at bedtime as needed. 05/10/15 05/16/15  Olen Cordial, NP  sodium chloride (OCEAN) 0.65 % SOLN nasal spray Place 2 sprays into both nostrils every 2 (two) hours while awake. 05/10/15   Olen Cordial, NP   Meds Ordered and Administered this Visit  Medications - No data to display  BP 135/64 mmHg  Pulse 72  Temp(Src) 98 F (36.7 C) (Oral)  Resp 16  Ht 5\' 11"  (1.803 m)  Wt 200 lb (90.719 kg)  BMI 27.91 kg/m2  SpO2 98% No data found.   Physical Exam  Constitutional: He is oriented to person, place, and time. Vital signs are normal. He appears well-developed and well-nourished. He is active and cooperative.  Non-toxic appearance. He does not have a sickly appearance. He appears ill. No distress.  HENT:  Head: Normocephalic and atraumatic.  Right Ear: Hearing, external ear and ear canal normal.  A middle ear effusion is present.  Left Ear: Hearing, external ear and ear canal normal. A middle ear effusion is present.  Nose: Mucosal edema and rhinorrhea present. No nose lacerations, sinus tenderness, nasal deformity, septal deviation or nasal septal hematoma. No epistaxis.  No foreign bodies. Right sinus exhibits maxillary sinus tenderness. Right sinus exhibits no frontal sinus tenderness. Left sinus exhibits maxillary sinus tenderness. Left sinus exhibits no frontal sinus tenderness.  Mouth/Throat: Uvula is midline and mucous membranes are normal. Mucous membranes are not pale, not  dry and not cyanotic. He does not have dentures. No oral lesions. No trismus in the jaw. Normal dentition. No dental abscesses, uvula swelling, lacerations or dental caries. Posterior oropharyngeal edema and posterior oropharyngeal erythema present. No oropharyngeal exudate or tonsillar abscesses.  Cobblestoning posterior pharynx; bilateral TMs with air fluid level slight opacity; bilateral nasal turbinates with edema/erythema/boggy/yellow discharge  Eyes: Conjunctivae, EOM and lids are normal. Pupils are equal, round, and reactive to light. Right eye exhibits no chemosis, no discharge, no exudate and no hordeolum. No foreign body present in the right eye. Left eye exhibits no chemosis, no discharge, no exudate and no hordeolum. No foreign body present in the left eye. Right conjunctiva is not injected. Right conjunctiva has no hemorrhage. Left conjunctiva is not injected. Left conjunctiva has no hemorrhage. No scleral icterus. Right eye exhibits normal extraocular motion and no nystagmus. Left eye exhibits normal extraocular motion and no nystagmus. Right pupil is round and reactive. Left pupil is round and reactive. Pupils are equal.  Neck: Trachea normal and normal range of motion. Neck supple. No tracheal tenderness, no spinous process tenderness and no muscular tenderness present. No rigidity. No tracheal deviation, no edema, no erythema and normal range of motion present. No thyroid mass and no thyromegaly present.  Cardiovascular: Normal rate, regular rhythm, S1 normal, S2 normal, normal heart sounds and intact distal pulses.  PMI is not displaced.  Exam reveals no gallop and no friction rub.   No murmur heard. Pulmonary/Chest: Effort normal and breath sounds normal. No stridor. No respiratory distress. He has no decreased breath sounds. He has no wheezes. He has no rhonchi. He has no rales.  Negative egophany all fields  Abdominal: Soft. He exhibits no distension.  Musculoskeletal: Normal range of  motion. He exhibits no edema or tenderness.  Lymphadenopathy:       Head (right side): No submental, no submandibular, no tonsillar, no preauricular, no posterior auricular and no occipital adenopathy present.       Head (left side): No submental, no submandibular, no tonsillar, no preauricular, no posterior auricular and no occipital adenopathy present.    He has no cervical adenopathy.       Right cervical: No superficial cervical, no deep cervical and no posterior cervical adenopathy present.      Left cervical: No superficial cervical, no deep cervical and no posterior cervical adenopathy present.  Neurological: He is alert and oriented to person, place, and time. He displays no atrophy and no tremor. No cranial nerve deficit or sensory deficit. He exhibits normal muscle tone. He displays no seizure activity. Coordination and gait normal. GCS eye subscore is 4. GCS verbal subscore is 5. GCS motor subscore is 6.  Skin: Skin is warm, dry and intact. No abrasion, no bruising, no burn, no ecchymosis, no laceration, no lesion, no petechiae and no rash noted. He is not diaphoretic. No cyanosis or erythema. No pallor. Nails show no clubbing.  Psychiatric: He has a normal mood  and affect. His speech is normal and behavior is normal. Judgment and thought content normal. Cognition and memory are normal.  Nursing note and vitals reviewed.   ED Course  Procedures (including critical care time)  Labs Review Labs Reviewed - No data to display  Imaging Review No results found.    MDM   1. Acute maxillary sinusitis, recurrence not specified   2. Otitis media with effusion, bilateral   3. Adjustment insomnia    Supportive treatment.   No evidence of invasive bacterial infection, non toxic and well hydrated.  This is most likely self limiting viral infection.  I do not see where any further testing or imaging is necessary at this time.   I will suggest supportive care, rest, good hygiene and  encourage the patient to take adequate fluids.  The patient is to return to clinic or EMERGENCY ROOM if symptoms worsen or change significantly e.g. ear pain, fever, purulent discharge from ears or bleeding.  Exitcare handout on otitis media with effusion given to patient.  Patient verbalized agreement and understanding of treatment plan.    Suspect Viral illness: no evidence of invasive bacterial infection, non toxic and well hydrated.  This is most likely self limiting viral infection.  I do not see where any further testing or imaging is necessary at this time.   I will suggest supportive care, rest, good hygiene and encourage the patient to take adequate fluids.  Does not require work excuse. flonase 1 spray each nostril BID prn, nasal saline 1-2 sprays each nostril prn q2h, tylenol 1000mg  po QID prn. Tessalon pearles 100mg  po TID prn cough. Discussed honey with lemon and salt water gargles for comfort also.  The patient is to return to clinic or EMERGENCY ROOM if symptoms worsen or change significantly e.g. fever, lethargy, SOB, wheezing.  Exitcare handout on viral illness given to patient.  Patient verbalized agreement and understanding of treatment plan.    flonase 1 spray each nostril BID, nasal saline 2 sprays each nostril q2h prn congestion.  If no relief 48-72 hours start doxycycline 100mg  po BID x 10 days.  No evidence of systemic bacterial infection, non toxic and well hydrated.  I do not see where any further testing or imaging is necessary at this time.   I will suggest supportive care, rest, good hygiene and encourage the patient to take adequate fluids.  The patient is to return to clinic or EMERGENCY ROOM if symptoms worsen or change significantly.  Exitcare handout on sinusitis given to patient.  Patient verbalized agreement and understanding of treatment plan and had no further questions at this time.   P2:  Hand washing and cover cough  Spouse deceased 2023/04/13 after illness.  Patient still  adjusting to not being primary caregiver hyperalert at night still listening for noises/her calling him for help in the night.  Discussed sleep hygiene at length re: regular schedule, avoid screen time 2 hours prior to bed time, dark/quiet/cool bedroom, avoid large meals within 1 hour of sleeping; avoid strenuous exercise within one hour of bedtime; avoid caffeine use after lunch.   Try shower/bath; warm non-alcoholic/caffeinated drink prior to bed.  If unable to sleep move to another room with lights out until sleepy/write concerns on notepad or read boring material until sleepy then return to bedroom avoid screen time in middle of night.  Exitcare handout on insomnia given to patient. Consider lavender spray on pillowcases, chamomile tea noncaffeinated, melatonin 1-2mg  po bedtime can increase up to 4mg   but if no improvement after 1-2 weeks consider follow up with PCM.  Discussed ambien/benadryl can become addicting and interact with his chronic medications.  Recommended daily exercise in ams.  Follow up with PCM if no improvement in symptoms with above strategies and will consider sleep aid prescription.  Patient verbalized understanding of instructions, agreed with plan of care and had no further questions at this time. P2:  stress reduction, exercise   Olen Cordial, NP 05/12/15 [id number]

## 2015-05-14 ENCOUNTER — Other Ambulatory Visit: Payer: Self-pay | Admitting: Cardiovascular Disease

## 2015-07-07 ENCOUNTER — Other Ambulatory Visit: Payer: Self-pay | Admitting: Cardiovascular Disease

## 2015-07-25 ENCOUNTER — Ambulatory Visit (INDEPENDENT_AMBULATORY_CARE_PROVIDER_SITE_OTHER): Payer: Medicare Other | Admitting: Cardiovascular Disease

## 2015-07-25 ENCOUNTER — Encounter: Payer: Self-pay | Admitting: Cardiovascular Disease

## 2015-07-25 VITALS — BP 140/90 | HR 66 | Resp 16 | Ht 71.0 in | Wt 218.0 lb

## 2015-07-25 DIAGNOSIS — E782 Mixed hyperlipidemia: Secondary | ICD-10-CM

## 2015-07-25 DIAGNOSIS — I2510 Atherosclerotic heart disease of native coronary artery without angina pectoris: Secondary | ICD-10-CM | POA: Diagnosis not present

## 2015-07-25 DIAGNOSIS — I4891 Unspecified atrial fibrillation: Secondary | ICD-10-CM | POA: Diagnosis not present

## 2015-07-25 NOTE — Patient Instructions (Signed)
You are doing well. No medication changes were made.  Please call us if you have new issues that need to be addressed before your next appt.  Your physician wants you to follow-up in: 6 months.  You will receive a reminder letter in the mail two months in advance. If you don't receive a letter, please call our office to schedule the follow-up appointment.   

## 2015-07-25 NOTE — Assessment & Plan Note (Addendum)
Currently with no symptoms of angina. No further workup at this time. Continue current medication regimen.  He has indicated he might need colonoscopy. Recommended this would be okay, no further testing needed prior to the procedure, we would hold Plavix 5 days prior to the colonoscopy

## 2015-07-25 NOTE — Assessment & Plan Note (Signed)
Cholesterol is at goal on the current lipid regimen based on 2016 numbers . No changes to the medications were made.

## 2015-07-25 NOTE — Progress Notes (Signed)
Patient ID: VONN BUOL, male    DOB: 1932/03/17, 82 y.o.   MRN: [id number]  HPI Comments: Mr. Zoucha is a very pleasant 82 year old gentleman with a history of coronary artery disease, stent placed to his distal RCA in 2005, stent placed to his mid LAD, recent catheterization 09/17/2014 showing critical ostial LAD and proximal left circumflex disease, transferred to [facility], who presents today for follow-up of his cad and  three-vessel CABG. Postoperatively had atrial fibrillation, started on anticoagulation and amiodarone  In follow-up he reports that he feels well.  He is taking aspirin and Plavix. Denies episodes of atrial fibrillation Reports having numbness down his left arm at times No regular exercise program But does walk in the park when he can He is concerned he might need colonoscopy, was told after his last procedure that it should be every 3 years  EKG on today's visit shows sinus bradycardia with rate 66 bpm, nonspecific ST abnormality  Other past medical history mild carotid plaquing with mixed irregular plaque estimated at 40% on the right,  History of total knee replacement with rehabilitation at [facility]. At the rehabilitation, he was receiving atorvastatin 80 mg daily with Vytorin (dose unclear). He became very ill, had chills, rigors, sweating, urine was very dark. He was noticed to be jaundice and checked himself out of rehabilitation and went to the hospital, noted to have hepatitis. Symptoms resolved by holding the statin's. LFTs improved back to normal  Lipitor was restarted at low dose 20 mg   Prior episode of severe dizziness. He was driving at the time and had to pullover. Symptoms resolved without intervention. That night he developed upper respiratory infection with runny nose. Currently has severe cough and  unable to sleep. Additional mild episode of dizziness several days later. No further episodes since then  Cardiac catheter September  2005 shows 90% mid LAD, 80% distal RCA, 50% OM1 at the mid vessel, Taxus stent 2.75 x 24 mm stent to his LAD, TAxus stent 2.5 x 16 mm stent to the RCA.   stress test in July 2011 showed mild ischemia in the inferior wall consistent with previous stress test      No Known Allergies  Outpatient Encounter Prescriptions as of 07/25/2015  Medication Sig  . aspirin 81 MG EC tablet Take 81 mg by mouth daily.    Marland Kitchen atorvastatin (LIPITOR) 20 MG tablet Take 1 tablet (20 mg total) by mouth daily.  . benzonatate (TESSALON) 100 MG capsule Take 1 capsule (100 mg total) by mouth 3 (three) times daily as needed for cough.  . clopidogrel (PLAVIX) 75 MG tablet Take 1 tablet (75 mg total) by mouth daily.  . fluticasone (FLONASE) 50 MCG/ACT nasal spray Place 1 spray into both nostrils 2 (two) times daily.  Marland Kitchen levobunolol (BETAGAN) 0.5 % ophthalmic solution Place 1 drop into both eyes every morning.  . metoprolol tartrate (LOPRESSOR) 25 MG tablet Take 1 tablet (25 mg total) by mouth 2 (two) times daily.  Marland Kitchen omeprazole (PRILOSEC) 20 MG capsule Take 20 mg by mouth daily.  . sodium chloride (OCEAN) 0.65 % SOLN nasal spray Place 2 sprays into both nostrils every 2 (two) hours while awake.  . [DISCONTINUED] clopidogrel (PLAVIX) 75 MG tablet TAKE 1 TABLET BY MOUTH EVERY DAY (Patient not taking: Reported on 07/25/2015)  . [DISCONTINUED] metoprolol tartrate (LOPRESSOR) 25 MG tablet TAKE 1/2 TABLET BY MOUTH TWICE A DAY (Patient not taking: Reported on 07/25/2015)  . [DISCONTINUED] Omega-3 Fatty  Acids (FISH OIL) 1200 MG CAPS Take 1,200 mg by mouth daily. Reported on 07/25/2015   No facility-administered encounter medications on file as of 07/25/2015.    Past Medical History  Diagnosis Date  . Coronary artery disease     a. PCI/DES to mLAD and dRCA in 2005, residual OM1 50%; b. nuclear stress test 02/2008: no ischemia; c. nuclear stress test 11/2009: mild ischemia in the inferior wall c/w previous stress test; d. cath  09/17/2014: critical ostial/prox LAD estimated at 90-95%, critical prox LCx, RCA w/ mild diff dz, EF >55%, no WMA  . Hyperlipidemia   . Hypertension   . Carotid artery stenosis     a. RICA 40%  . GERD (gastroesophageal reflux disease)   . PVD (peripheral vascular disease) [facility])     Past Surgical History  Procedure Laterality Date  . Cardiac catheterization    . Total knee arthroplasty      bilateral  . Cholecystectomy  2016  . Coronary artery bypass graft N/A 09/20/2014    Procedure: CORONARY ARTERY BYPASS GRAFTING (CABG) x   three using left internal mammary artery and right leg greater saphenous vein harvested endoscopically;  Surgeon: Melrose Nakayama, MD;  Location: [place];  Service: Open Heart Surgery;  Laterality: N/A;  . Tee without cardioversion N/A 09/20/2014    Procedure: TRANSESOPHAGEAL ECHOCARDIOGRAM (TEE);  Surgeon: Melrose Nakayama, MD;  Location: [place];  Service: Open Heart Surgery;  Laterality: N/A;    Social History  reports that he has quit smoking. He has never used smokeless tobacco. He reports that he does not drink alcohol or use illicit drugs.  Family History family history includes Heart failure in his mother.       Review of Systems  Constitutional: Negative.   Respiratory: Negative.   Cardiovascular: Negative.   Gastrointestinal: Negative.   Musculoskeletal: Negative.   Skin: Negative.   Neurological: Negative.   Hematological: Negative.   Psychiatric/Behavioral: Negative.   All other systems reviewed and are negative.   BP 140/90 mmHg  Pulse 66  Resp 16  Ht 5\' 11"  (1.803 m)  Wt 218 lb (98.884 kg)  BMI 30.42 kg/m2  Physical Exam  Constitutional: He is oriented to person, place, and time. He appears well-developed and well-nourished.  HENT:  Head: Normocephalic.  Nose: Nose normal.  Mouth/Throat: Oropharynx is clear and moist.  Eyes: Conjunctivae are normal. Pupils are equal, round, and reactive to light.  Neck: Normal range of  motion. Neck supple. No JVD present.  Cardiovascular: Normal rate, regular rhythm, S1 normal, S2 normal, normal heart sounds and intact distal pulses.  Exam reveals no gallop and no friction rub.   No murmur heard. Well healed mediastinal scar  Pulmonary/Chest: Effort normal and breath sounds normal. No respiratory distress. He has no wheezes. He has no rales. He exhibits no tenderness.  Abdominal: Soft. Bowel sounds are normal. He exhibits no distension. There is no tenderness.  Musculoskeletal: Normal range of motion. He exhibits no edema or tenderness.  Lymphadenopathy:    He has no cervical adenopathy.  Neurological: He is alert and oriented to person, place, and time. Coordination normal.  Skin: Skin is warm and dry. No rash noted. No erythema.  Psychiatric: He has a normal mood and affect. His behavior is normal. Judgment and thought content normal.      Assessment and Plan   Nursing note and vitals reviewed.

## 2015-07-25 NOTE — Assessment & Plan Note (Signed)
No clinical concern for arrhythmia He did have postoperative atrial fibrillation

## 2015-07-30 DIAGNOSIS — Z1211 Encounter for screening for malignant neoplasm of colon: Secondary | ICD-10-CM | POA: Diagnosis not present

## 2015-08-25 DIAGNOSIS — E785 Hyperlipidemia, unspecified: Secondary | ICD-10-CM | POA: Diagnosis not present

## 2015-08-25 DIAGNOSIS — E119 Type 2 diabetes mellitus without complications: Secondary | ICD-10-CM | POA: Diagnosis not present

## 2015-08-25 DIAGNOSIS — I10 Essential (primary) hypertension: Secondary | ICD-10-CM | POA: Diagnosis not present

## 2015-09-01 DIAGNOSIS — I2510 Atherosclerotic heart disease of native coronary artery without angina pectoris: Secondary | ICD-10-CM | POA: Diagnosis not present

## 2015-09-01 DIAGNOSIS — I10 Essential (primary) hypertension: Secondary | ICD-10-CM | POA: Diagnosis not present

## 2015-09-01 DIAGNOSIS — E785 Hyperlipidemia, unspecified: Secondary | ICD-10-CM | POA: Diagnosis not present

## 2015-09-01 DIAGNOSIS — K219 Gastro-esophageal reflux disease without esophagitis: Secondary | ICD-10-CM | POA: Diagnosis not present

## 2015-09-01 DIAGNOSIS — E041 Nontoxic single thyroid nodule: Secondary | ICD-10-CM | POA: Diagnosis not present

## 2015-09-01 DIAGNOSIS — E119 Type 2 diabetes mellitus without complications: Secondary | ICD-10-CM | POA: Diagnosis not present

## 2015-10-16 DIAGNOSIS — H3563 Retinal hemorrhage, bilateral: Secondary | ICD-10-CM | POA: Diagnosis not present

## 2015-10-23 ENCOUNTER — Ambulatory Visit
Admission: EM | Admit: 2015-10-23 | Discharge: 2015-10-23 | Disposition: A | Discharge status: Home / Self Care | Payer: Medicare Other | Attending: Family Medicine | Admitting: Family Medicine

## 2015-10-23 ENCOUNTER — Encounter: Payer: Self-pay | Admitting: *Deleted

## 2015-10-23 DIAGNOSIS — H61891 Other specified disorders of right external ear: Secondary | ICD-10-CM | POA: Diagnosis not present

## 2015-10-23 DIAGNOSIS — H6121 Impacted cerumen, right ear: Secondary | ICD-10-CM

## 2015-10-23 MED ORDER — CARBAMIDE PEROXIDE 6.5 % OT SOLN: 5.0000 [drp] | Freq: Two times a day (BID) | OTIC | Status: DC

## 2015-10-23 NOTE — ED Provider Notes (Addendum)
CSN: [id number]     Arrival date & time 10/23/15  1527 History   First MD Initiated Contact with Patient 10/23/15 1600    Nurses notes were reviewed. Chief Complaint  Patient presents with  . Cerumen Impaction   (Consider location/radiation/quality/duration/timing/severity/associated sxs/prior Treatment) Patient is a 82 y.o. male presenting with ear pain. The history is provided by the patient. No language interpreter was used.  Otalgia Location:  Right Severity:  No pain Timing:  Constant Chronicity:  New Context: foreign body   Worsened by:  Nothing tried Associated symptoms: hearing loss      Patient reports having excess mild wax in his right ear as reported by the audiologist yesterday. He was the audiologist's office yesterday fitted for possible hearing aid she uses in the right ear. They informed him that he had excessive wax in the right ear and that he needs to have the wax removed before they tried to get him a new or fit him for new hearing aid. He denies any significant pain or discomfort but does report decreased hearing despite using the hearing aid.  He's had triple bypass surgery he has a history coronary artery artery disease, is recently lost his wife and he has a history of smoking in the past. He still has his hyperlipidemia and hypertension and carotid artery stenosis. And peripheral vascular disease.   Family medical history is positive for mother with heart failure.   Past Medical History  Diagnosis Date  . Coronary artery disease     a. PCI/DES to mLAD and dRCA in 2005, residual OM1 50%; b. nuclear stress test 02/2008: no ischemia; c. nuclear stress test 11/2009: mild ischemia in the inferior wall c/w previous stress test; d. cath 09/17/2014: critical ostial/prox LAD estimated at 90-95%, critical prox LCx, RCA w/ mild diff dz, EF >55%, no WMA  . Hyperlipidemia   . Hypertension   . Carotid artery stenosis     a. RICA 40%  . GERD (gastroesophageal reflux  disease)   . PVD (peripheral vascular disease) [facility])    Past Surgical History  Procedure Laterality Date  . Cardiac catheterization    . Total knee arthroplasty      bilateral  . Cholecystectomy  2016  . Coronary artery bypass graft N/A 09/20/2014    Procedure: CORONARY ARTERY BYPASS GRAFTING (CABG) x   three using left internal mammary artery and right leg greater saphenous vein harvested endoscopically;  Surgeon: Melrose Nakayama, MD;  Location: [facility];  Service: Open Heart Surgery;  Laterality: N/A;  . Tee without cardioversion N/A 09/20/2014    Procedure: TRANSESOPHAGEAL ECHOCARDIOGRAM (TEE);  Surgeon: Melrose Nakayama, MD;  Location: [facility];  Service: Open Heart Surgery;  Laterality: N/A;   Family History  Problem Relation Age of Onset  . Heart failure Mother    Social History  Substance Use Topics  . Smoking status: Former Research scientist (life sciences)  . Smokeless tobacco: Never Used  . Alcohol Use: No    Review of Systems  HENT: Positive for ear pain and hearing loss.   All other systems reviewed and are negative.   Allergies  Review of patient's allergies indicates no known allergies.  Home Medications   Prior to Admission medications   Medication Sig Start Date End Date Taking? Authorizing Provider  aspirin 81 MG EC tablet Take 81 mg by mouth daily.     Yes Historical Provider, MD  atorvastatin (LIPITOR) 20 MG tablet Take 1 tablet (20 mg total) by mouth daily. 10/18/14  Yes Minna Merritts, MD  clopidogrel (PLAVIX) 75 MG tablet Take 1 tablet (75 mg total) by mouth daily. 11/04/14  Yes Erin R Barrett, PA-C  metoprolol tartrate (LOPRESSOR) 25 MG tablet Take 1 tablet (25 mg total) by mouth 2 (two) times daily. 09/26/14  Yes Erin R Barrett, PA-C  omeprazole (PRILOSEC) 20 MG capsule Take 20 mg by mouth daily.   Yes Historical Provider, MD  benzonatate (TESSALON) 100 MG capsule Take 1 capsule (100 mg total) by mouth 3 (three) times daily as needed for cough. 05/10/15   Olen Cordial, NP   carbamide peroxide (DEBROX) 6.5 % otic solution Place 5 drops into the right ear 2 (two) times daily. 10/23/15   Frederich Cha, MD  fluticasone (FLONASE) 50 MCG/ACT nasal spray Place 1 spray into both nostrils 2 (two) times daily. 05/10/15   Olen Cordial, NP  levobunolol (BETAGAN) 0.5 % ophthalmic solution Place 1 drop into both eyes every morning. 07/29/14   Historical Provider, MD  sodium chloride (OCEAN) 0.65 % SOLN nasal spray Place 2 sprays into both nostrils every 2 (two) hours while awake. 05/10/15   Olen Cordial, NP   Meds Ordered and Administered this Visit  Medications - No data to display  BP 136/67 mmHg  Pulse 58  Temp(Src) 98.1 F (36.7 C) (Oral)  Resp 16  Ht 5\' 11"  (1.803 m)  Wt 200 lb (90.719 kg)  BMI 27.91 kg/m2  SpO2 97% No data found.   Physical Exam  Constitutional: He is oriented to person, place, and time. He appears well-developed and well-nourished.  Elderly white male  HENT:  Head: Normocephalic and atraumatic.  Right Ear: A foreign body is present.  Nose: Nose normal. No mucosal edema.  Mouth/Throat: Uvula is midline and mucous membranes are normal.  Patient has a lot of cerumen in the right ear  Eyes: Pupils are equal, round, and reactive to light. Right eye exhibits no discharge.  Neck: Normal range of motion. Neck supple. No tracheal deviation present.  Musculoskeletal: Normal range of motion.  Neurological: He is alert and oriented to person, place, and time.  Skin: Skin is warm and dry.  Psychiatric: He has a normal mood and affect.  Vitals reviewed.   ED Course  .Ear Cerumen Removal Date/Time: 10/23/2015 4:17 PM Performed by: Frederich Cha Authorized by: Frederich Cha Consent: Verbal consent obtained. Consent given by: patient Local anesthetic: none Location details: right ear Procedure type: irrigation Patient sedated: no Comments: Reduction wax in the right ear with irrigation attempts to remove the remaining wax with your crit  was unsuccessful because patient cannot tolerate it. In fact at the time when the correct was being used he hit my hand driving the correct into the ear canal causing bleeding. Bleeding is minimal and no further work on the ears is going to be done at this time.   (including critical care time)  Labs Review Labs Reviewed - No data to display  Imaging Review No results found.   Visual Acuity Review  Right Eye Distance:   Left Eye Distance:   Bilateral Distance:    Right Eye Near:   Left Eye Near:    Bilateral Near:         MDM   1. Cerumen impaction, right   2. Irritation of external ear canal, right    Will have patient use Debrox ear wax softener in the right ear 5 drops at least 3 times a day. He may return in 1-2  weeks for repeat irrigation was the earwax is soft and. Recommend no hearing aid in the right ear for least a day to just allow the right ear canal to heal.    Frederich Cha, MD 10/23/15 1640  Frederich Cha, MD 10/23/15 [phone]

## 2015-10-23 NOTE — ED Notes (Signed)
Pt seen at audiologist appt and advised that he had a cerumen impaction in his right ear and that needed to be irrigated. Denies pain.

## 2015-10-23 NOTE — Discharge Instructions (Signed)
Cerumen Impaction The structures of the external ear canal secrete a waxy substance known as cerumen. Excess cerumen can build up in the ear canal, causing a condition known as cerumen impaction. Cerumen impaction can cause ear pain and disrupt the function of the ear. The rate of cerumen production differs for each individual. In certain individuals, the configuration of the ear canal may decrease his or her ability to naturally remove cerumen. CAUSES Cerumen impaction is caused by excessive cerumen production or buildup. RISK FACTORS  Frequent use of swabs to clean ears.  Having narrow ear canals.  Having eczema.  Being dehydrated. SIGNS AND SYMPTOMS  Diminished hearing.  Ear drainage.  Ear pain.  Ear itch. TREATMENT Treatment may involve:  Over-the-counter or prescription ear drops to soften the cerumen.  Removal of cerumen by a health care provider. This may be done with:  Irrigation with warm water. This is the most common method of removal.  Ear curettes and other instruments.  Surgery. This may be done in severe cases. HOME CARE INSTRUCTIONS  Take medicines only as directed by your health care provider.  Do not insert objects into the ear with the intent of cleaning the ear. PREVENTION  Do not insert objects into the ear, even with the intent of cleaning the ear. Removing cerumen as a part of normal hygiene is not necessary, and the use of swabs in the ear canal is not recommended.  Drink enough water to keep your urine clear or pale yellow.  Control your eczema if you have it. SEEK MEDICAL CARE IF:  You develop ear pain.  You develop bleeding from the ear.  The cerumen does not clear after you use ear drops as directed.   This information is not intended to replace advice given to you by your health care provider. Make sure you discuss any questions you have with your health care provider.   Document Released: 06/24/2004 Document Revised: 06/07/2014  Document Reviewed: 01/01/2015 Elsevier Interactive Patient Education 2016 [employer]  

## 2015-10-24 DIAGNOSIS — H353131 Nonexudative age-related macular degeneration, bilateral, early dry stage: Secondary | ICD-10-CM | POA: Diagnosis not present

## 2015-10-24 DIAGNOSIS — H35033 Hypertensive retinopathy, bilateral: Secondary | ICD-10-CM | POA: Diagnosis not present

## 2015-10-24 DIAGNOSIS — H348322 Tributary (branch) retinal vein occlusion, left eye, stable: Secondary | ICD-10-CM | POA: Diagnosis not present

## 2015-11-20 DIAGNOSIS — Z872 Personal history of diseases of the skin and subcutaneous tissue: Secondary | ICD-10-CM | POA: Diagnosis not present

## 2015-11-20 DIAGNOSIS — D485 Neoplasm of uncertain behavior of skin: Secondary | ICD-10-CM | POA: Diagnosis not present

## 2015-11-20 DIAGNOSIS — L570 Actinic keratosis: Secondary | ICD-10-CM | POA: Diagnosis not present

## 2015-11-20 DIAGNOSIS — Z1283 Encounter for screening for malignant neoplasm of skin: Secondary | ICD-10-CM | POA: Diagnosis not present

## 2015-11-20 DIAGNOSIS — L281 Prurigo nodularis: Secondary | ICD-10-CM | POA: Diagnosis not present

## 2015-11-20 DIAGNOSIS — L821 Other seborrheic keratosis: Secondary | ICD-10-CM | POA: Diagnosis not present

## 2015-11-24 DIAGNOSIS — E119 Type 2 diabetes mellitus without complications: Secondary | ICD-10-CM | POA: Diagnosis not present

## 2015-11-24 DIAGNOSIS — K219 Gastro-esophageal reflux disease without esophagitis: Secondary | ICD-10-CM | POA: Diagnosis not present

## 2015-12-01 DIAGNOSIS — E119 Type 2 diabetes mellitus without complications: Secondary | ICD-10-CM | POA: Diagnosis not present

## 2015-12-01 DIAGNOSIS — I10 Essential (primary) hypertension: Secondary | ICD-10-CM | POA: Diagnosis not present

## 2015-12-19 DIAGNOSIS — H353131 Nonexudative age-related macular degeneration, bilateral, early dry stage: Secondary | ICD-10-CM | POA: Diagnosis not present

## 2015-12-19 DIAGNOSIS — H35033 Hypertensive retinopathy, bilateral: Secondary | ICD-10-CM | POA: Diagnosis not present

## 2015-12-19 DIAGNOSIS — H348322 Tributary (branch) retinal vein occlusion, left eye, stable: Secondary | ICD-10-CM | POA: Diagnosis not present

## 2015-12-26 DIAGNOSIS — L728 Other follicular cysts of the skin and subcutaneous tissue: Secondary | ICD-10-CM | POA: Diagnosis not present

## 2016-01-31 ENCOUNTER — Other Ambulatory Visit: Payer: Self-pay | Admitting: Cardiovascular Disease

## 2016-02-16 ENCOUNTER — Ambulatory Visit: Payer: Medicare Other | Admitting: Cardiovascular Disease

## 2016-02-18 ENCOUNTER — Other Ambulatory Visit: Payer: Self-pay | Admitting: Cardiovascular Disease

## 2016-02-24 ENCOUNTER — Ambulatory Visit: Payer: Medicare Other | Admitting: Cardiovascular Disease

## 2016-03-04 DIAGNOSIS — Z48817 Encounter for surgical aftercare following surgery on the skin and subcutaneous tissue: Secondary | ICD-10-CM | POA: Diagnosis not present

## 2016-03-09 DIAGNOSIS — E119 Type 2 diabetes mellitus without complications: Secondary | ICD-10-CM | POA: Diagnosis not present

## 2016-03-11 IMAGING — CT CT ABD-PELV W/ CM
2 of 5 series · 17 of 46 positions shown, 19 images · IV contrast (omnipaque)
Comparison: Ultrasound 07/25/2012

CLINICAL DATA: Left-sided chest pain, shortness of breath.

EXAM:
CT ABDOMEN AND PELVIS WITH CONTRAST
TECHNIQUE: Multidetector CT imaging of the abdomen and pelvis was performed
using the standard protocol following bolus administration of
intravenous contrast.
CONTRAST:  100 cc Omnipaque 300 IV

[Series 2: routine abd pel with · axial · 0.85mm/px · z∈[-1653,-1228]mm · 14 of 96 slices shown, 16 images]
[im 6/96  soft-tissue]
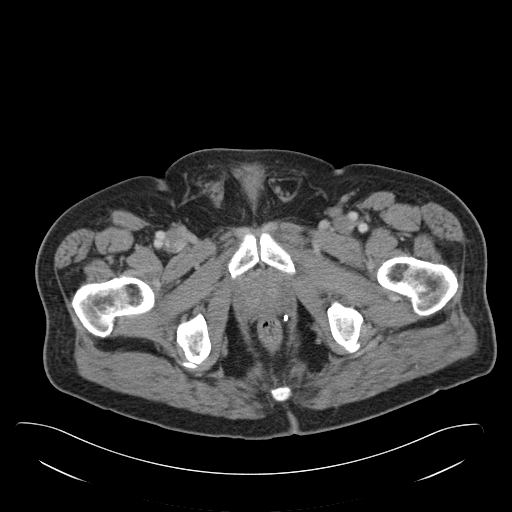
[im 6/96  bone]
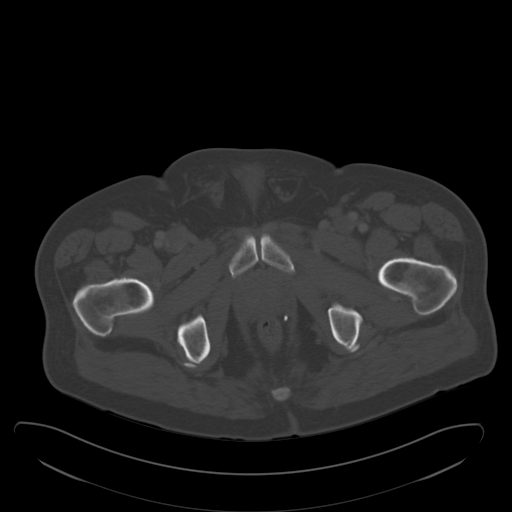
[im 11/96  soft-tissue]
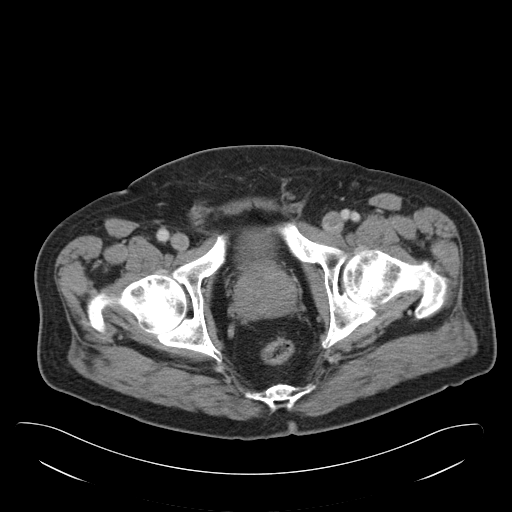
[im 21/96  soft-tissue]
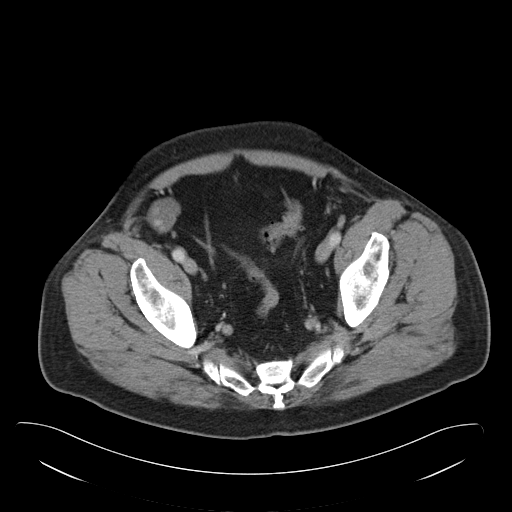
[im 26/96  soft-tissue]
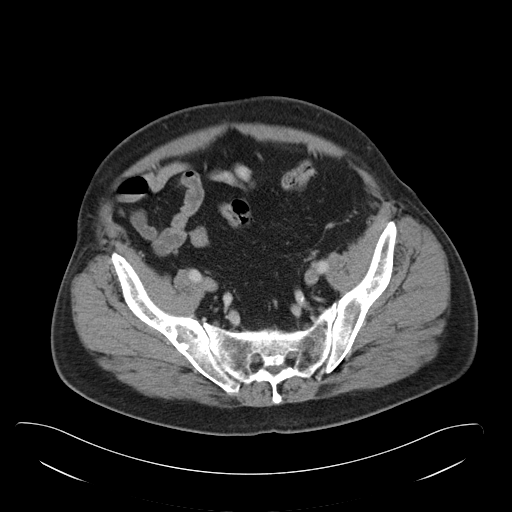
[im 31/96  soft-tissue]
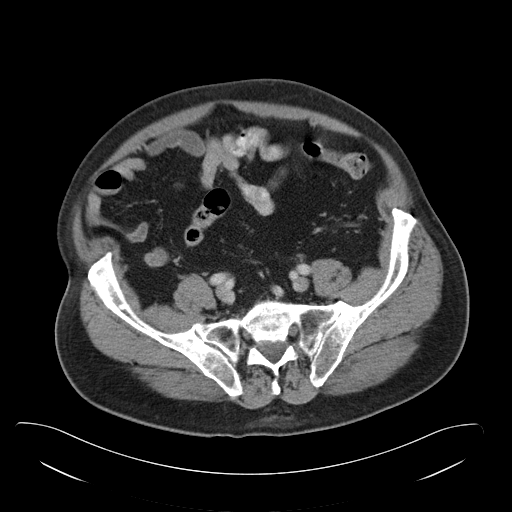
[im 41/96  soft-tissue]
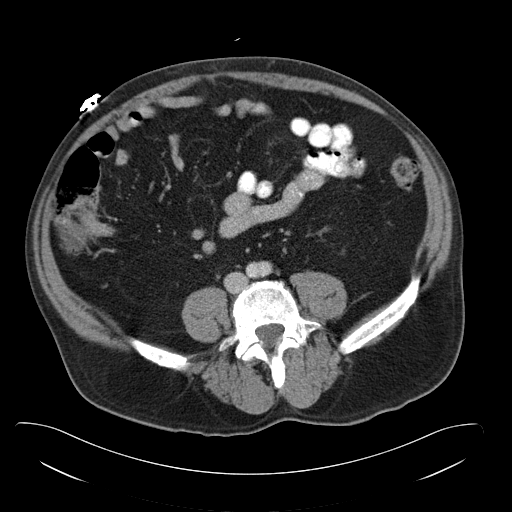
[im 46/96  soft-tissue]
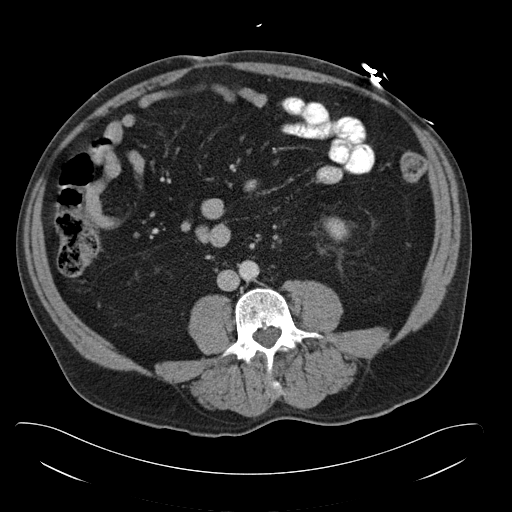
[im 51/96  soft-tissue]
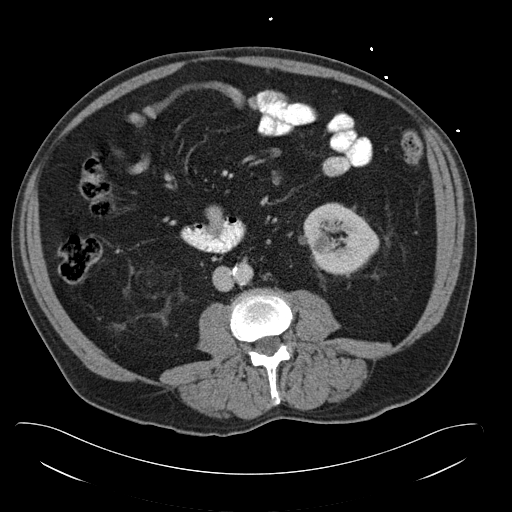
[im 56/96  soft-tissue]
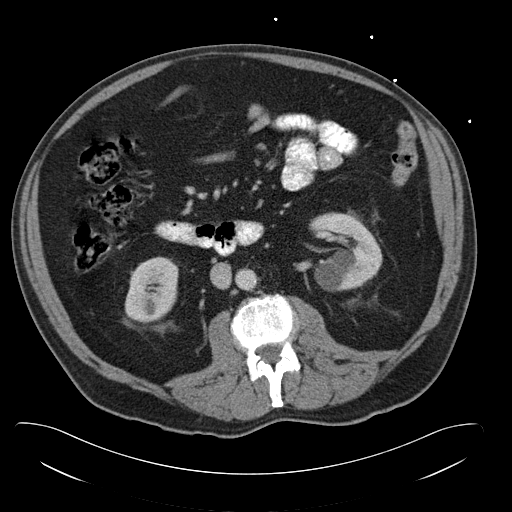
[im 56/96  bone]
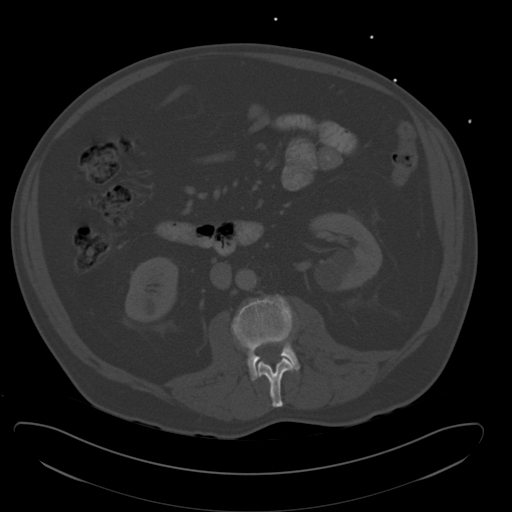
[im 66/96  soft-tissue]
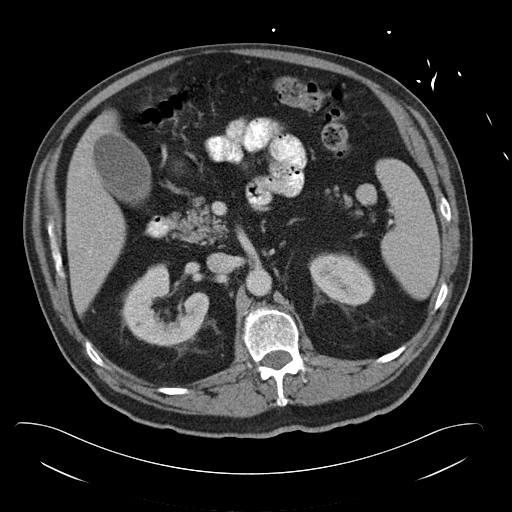
[im 71/96  soft-tissue]
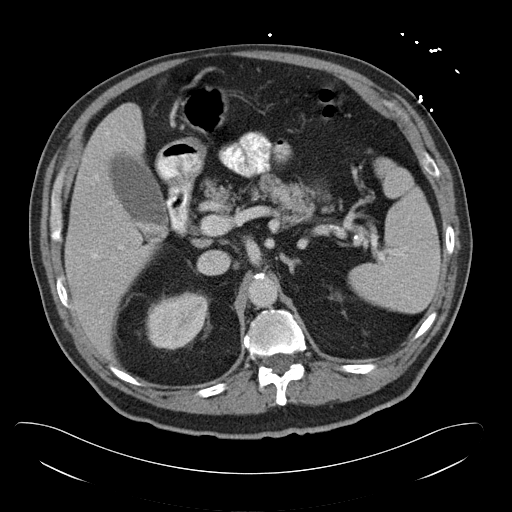
[im 76/96  soft-tissue]
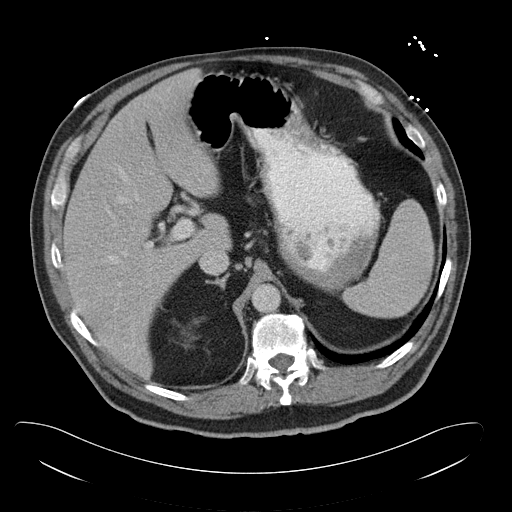
[im 86/96  soft-tissue]
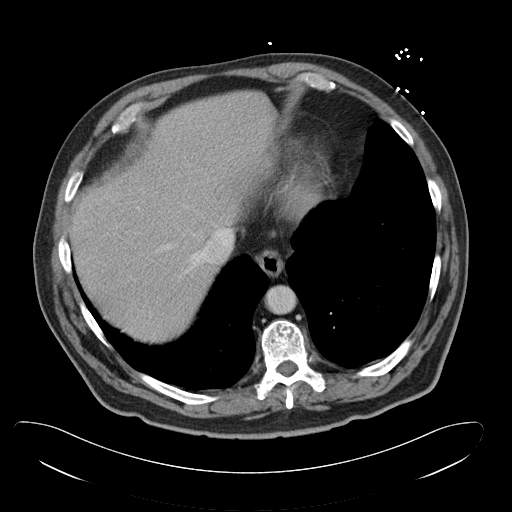
[im 91/96  soft-tissue]
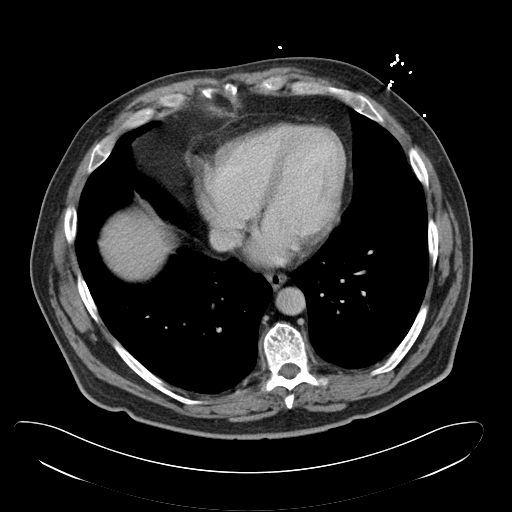

[Series 6: cor routine abd pel with · coronal · 0.94mm/px · 3 of 167 slices shown]
[im 56/167  soft-tissue]
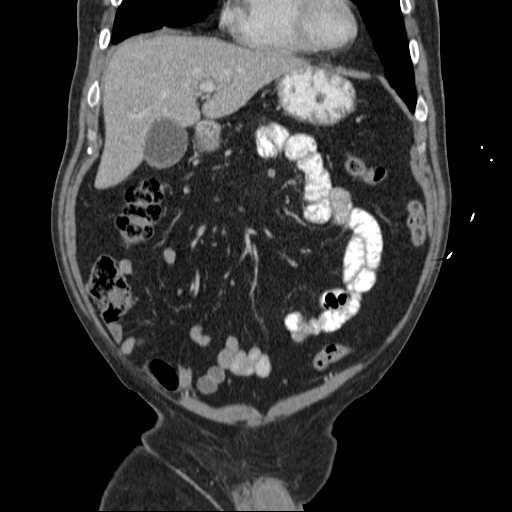
[im 74/167  soft-tissue]
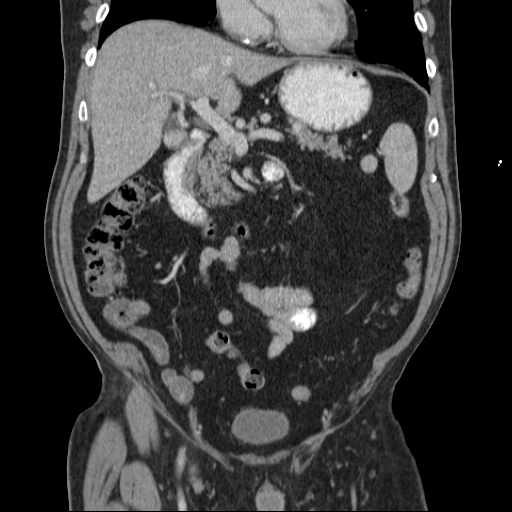
[im 93/167  soft-tissue]
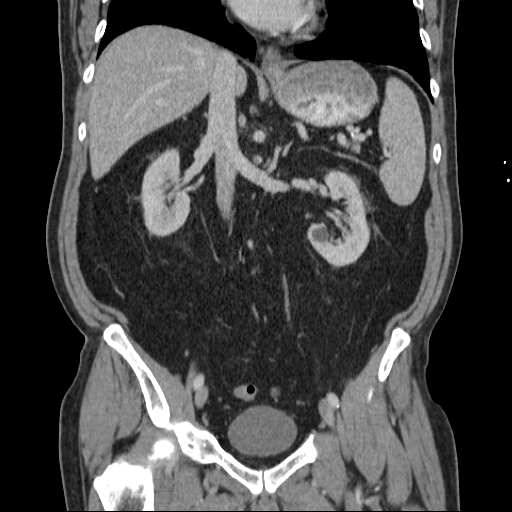

[17 of 46 positions shown; findings below may reference images not displayed]

FINDINGS: Coronary artery calcifications are noted. Lung bases are clear. No
effusions.

Layering gallstones noted within the gallbladder. Liver, spleen,
pancreas, adrenals are unremarkable. Small cysts in the kidneys
bilaterally. No hydronephrosis.

Descending colonic and sigmoid diverticulosis. No active
diverticulitis. Stomach, large and small bowel are unremarkable. No
free fluid, free air or adenopathy. Prostate is enlarged. Urinary
bladder is grossly unremarkable.

Aorta is normal caliber.  No acute bony abnormality.
IMPRESSION: Cholelithiasis.

Coronary artery disease.

Bilateral benign appearing renal cysts.

Prostatomegaly.

## 2016-03-11 IMAGING — CR DG CHEST 1V PORT
1 series · 1 of 1 positions shown · non-contrast
Comparison: CT 05/30/2013, chest x-ray 05/30/2013

CLINICAL DATA: 82-year-old male with a history of shortness of
breath and chest pain.

EXAM:
PORTABLE CHEST - 1 VIEW

[ap]
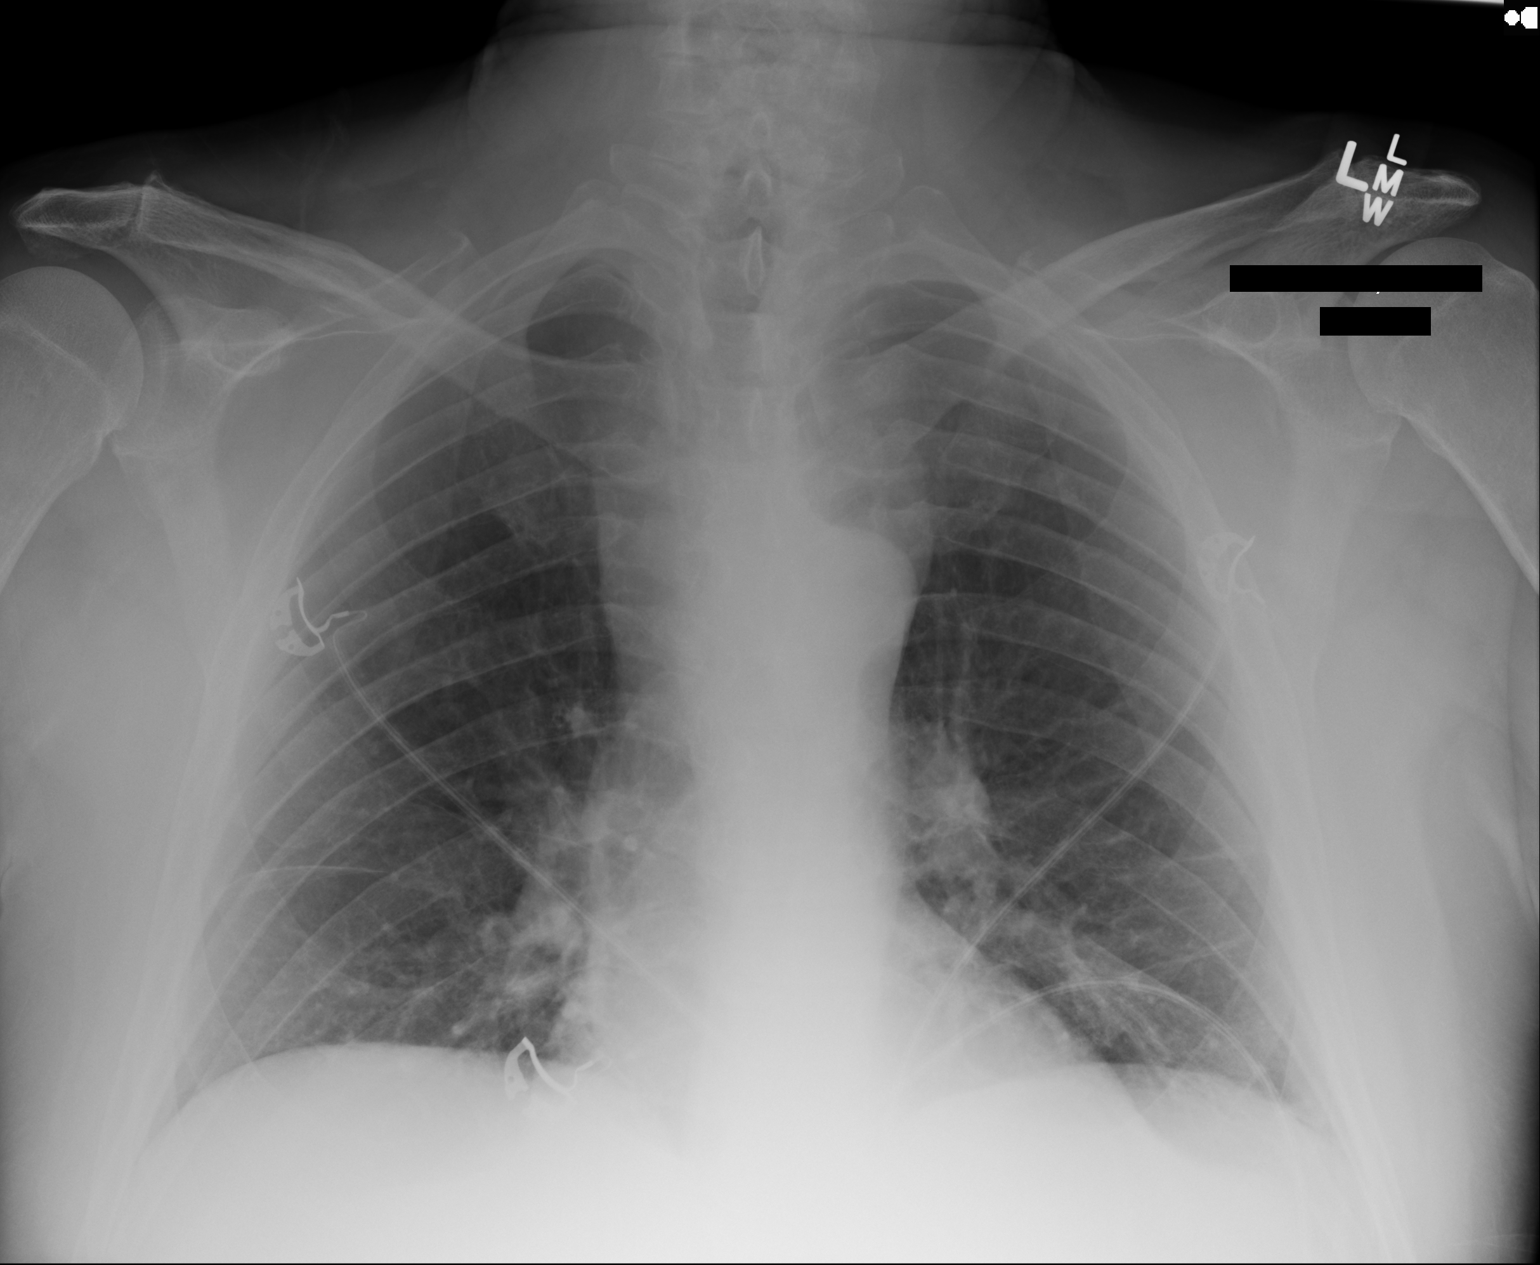

[1 of 1 positions shown; findings below may reference images not displayed]

FINDINGS: Cardiomediastinal silhouette unchanged in size and contour.
Persistent widening of the superior mediastinum, evident on prior
plain film and CT.

No evidence of pulmonary vascular congestion.

Low lung volumes with streaky opacities at the bases. No visualized
pneumothorax. No pleural effusion.

No displaced fracture.
IMPRESSION: No radiographic evidence of acute cardiopulmonary disease with
persisting atelectasis and/ or scarring at the lung bases.

Widening of the superior mediastinum is unchanged from the
comparison plain film, which was better characterized on prior CT
05/30/2013, where a thyroid/mediastinal mass was demonstrated.

## 2016-03-22 DIAGNOSIS — E119 Type 2 diabetes mellitus without complications: Secondary | ICD-10-CM | POA: Diagnosis not present

## 2016-03-22 DIAGNOSIS — K219 Gastro-esophageal reflux disease without esophagitis: Secondary | ICD-10-CM | POA: Diagnosis not present

## 2016-03-22 DIAGNOSIS — I10 Essential (primary) hypertension: Secondary | ICD-10-CM | POA: Diagnosis not present

## 2016-03-22 DIAGNOSIS — I2510 Atherosclerotic heart disease of native coronary artery without angina pectoris: Secondary | ICD-10-CM | POA: Diagnosis not present

## 2016-03-22 DIAGNOSIS — Z23 Encounter for immunization: Secondary | ICD-10-CM | POA: Diagnosis not present

## 2016-03-30 ENCOUNTER — Ambulatory Visit (INDEPENDENT_AMBULATORY_CARE_PROVIDER_SITE_OTHER): Payer: Medicare Other | Admitting: Cardiovascular Disease

## 2016-03-30 ENCOUNTER — Encounter: Payer: Self-pay | Admitting: Cardiovascular Disease

## 2016-03-30 VITALS — BP 150/80 | HR 68 | Ht 71.0 in | Wt 220.4 lb

## 2016-03-30 DIAGNOSIS — I2510 Atherosclerotic heart disease of native coronary artery without angina pectoris: Secondary | ICD-10-CM

## 2016-03-30 DIAGNOSIS — I4891 Unspecified atrial fibrillation: Secondary | ICD-10-CM | POA: Diagnosis not present

## 2016-03-30 DIAGNOSIS — Z951 Presence of aortocoronary bypass graft: Secondary | ICD-10-CM | POA: Diagnosis not present

## 2016-03-30 DIAGNOSIS — I739 Peripheral vascular disease, unspecified: Secondary | ICD-10-CM

## 2016-03-30 DIAGNOSIS — R7303 Prediabetes: Secondary | ICD-10-CM

## 2016-03-30 NOTE — Progress Notes (Signed)
Cardiology Office Note  Date:  03/30/2016   ID:  Harles, Kauk 1932-02-14, MRN [id number]  PCP:  Leonel Ramsay, MD   Chief Complaint  Patient presents with  . Atrial Fibrillation  . Coronary Artery Disease    HPI:  Mr. Zingg is a very pleasant 82 year old gentleman with a history of coronary artery disease, stent placed to his distal RCA in 2005, stent placed to his mid LAD, recent catheterization 09/17/2014 showing critical ostial LAD and proximal left circumflex disease, transferred to [facility] for bypass surgery, who presents today for follow-up of his cad and  three-vessel CABG. Postoperatively had atrial fibrillation, started on anticoagulation and amiodarone  In general he is doing well, still continues to lead a  lifestyle since the loss of his wife No hobbies, active in the church No complaints, denies any chest pain concerning for angina Reports his Sugars better Runs a business part-time   He is taking aspirin and Plavix. Denies episodes of atrial fibrillation  Total chol 120, HBA1C 7.4 down from 8.1  EKG on today's visit shows normal sinus rhythm with rate 65 bpm, nonspecific ST abnormality  Other past medical history mild carotid plaquing with mixed irregular plaque estimated at 40% on the right,  History of total knee replacement with rehabilitation at [facility]. At the rehabilitation, he was receiving atorvastatin 80 mg daily with Vytorin (dose unclear). He became very ill, had chills, rigors, sweating, urine was very dark. He was noticed to be jaundice and checked himself out of rehabilitation and went to the hospital, noted to have hepatitis. Symptoms resolved by holding the statin's. LFTs improved back to normal  Lipitor was restarted at low dose 20 mg   Prior episode of severe dizziness. He was driving at the time and had to pullover. Symptoms resolved without intervention. That night he developed upper respiratory infection with runny nose.  Currently has severe cough and  unable to sleep. Additional mild episode of dizziness several days later. No further episodes since then  Cardiac catheter September 2005 shows 90% mid LAD, 80% distal RCA, 50% OM1 at the mid vessel, Taxus stent 2.75 x 24 mm stent to his LAD, TAxus stent 2.5 x 16 mm stent to the RCA.   stress test in July 2011 showed mild ischemia in the inferior wall consistent with previous stress test     PMH:   has a past medical history of Carotid artery stenosis; Coronary artery disease; GERD (gastroesophageal reflux disease); Hyperlipidemia; Hypertension; and PVD (peripheral vascular disease) ([place]).  PSH:    Past Surgical History:  Procedure Laterality Date  . CARDIAC CATHETERIZATION    . CHOLECYSTECTOMY  2016  . CORONARY ARTERY BYPASS GRAFT N/A 09/20/2014   Procedure: CORONARY ARTERY BYPASS GRAFTING (CABG) x   three using left internal mammary artery and right leg greater saphenous vein harvested endoscopically;  Surgeon: Melrose Nakayama, MD;  Location: [facility];  Service: Open Heart Surgery;  Laterality: N/A;  . TEE WITHOUT CARDIOVERSION N/A 09/20/2014   Procedure: TRANSESOPHAGEAL ECHOCARDIOGRAM (TEE);  Surgeon: Melrose Nakayama, MD;  Location: [facility];  Service: Open Heart Surgery;  Laterality: N/A;  . TOTAL KNEE ARTHROPLASTY     bilateral    Current Outpatient Prescriptions  Medication Sig Dispense Refill  . aspirin 81 MG EC tablet Take 81 mg by mouth daily.      Marland Kitchen atorvastatin (LIPITOR) 20 MG tablet Take 1 tablet (20 mg total) by mouth daily. 90 tablet 3  . clopidogrel (PLAVIX)  75 MG tablet Take 1 tablet (75 mg total) by mouth daily.    Marland Kitchen levobunolol (BETAGAN) 0.5 % ophthalmic solution Place 1 drop into both eyes every morning.  6  . metoprolol tartrate (LOPRESSOR) 25 MG tablet Take 1 tablet (25 mg total) by mouth 2 (two) times daily. 60 tablet 3  . omeprazole (PRILOSEC) 20 MG capsule Take 20 mg by mouth daily.    . sodium chloride (OCEAN) 0.65 % SOLN  nasal spray Place 2 sprays into both nostrils every 2 (two) hours while awake. 30 mL 0  . metFORMIN (GLUCOPHAGE) 500 MG tablet Take 1 tablet (500 mg total) by mouth daily with breakfast.     No current facility-administered medications for this visit.      Allergies:   Review of patient's allergies indicates no known allergies.   Social History:  The patient  reports that he has quit smoking. He has never used smokeless tobacco. He reports that he does not drink alcohol or use drugs.   Family History:   family history includes Heart failure in his mother.    Review of Systems: Review of Systems  Constitutional: Negative.   Respiratory: Negative.   Cardiovascular: Negative.   Gastrointestinal: Negative.   Musculoskeletal: Negative.   Neurological: Negative.   Psychiatric/Behavioral: Positive for depression.  All other systems reviewed and are negative.    PHYSICAL EXAM: VS:  BP (!) 150/80   Pulse 68   Ht 5\' 11"  (1.803 m)   Wt 220 lb 6.4 oz (100 kg)   SpO2 96%   BMI 30.74 kg/m  , BMI Body mass index is 30.74 kg/m. GEN: Well nourished, well developed, in no acute distress  HEENT: normal  Neck: no JVD, carotid bruits, or masses Cardiac: RRR; no murmurs, rubs, or gallops,no edema  Respiratory:  clear to auscultation bilaterally, normal work of breathing GI: soft, nontender, nondistended, + BS MS: no deformity or atrophy  Skin: warm and dry, no rash Neuro:  Strength and sensation are intact Psych: euthymic mood, full affect    Recent Labs: No results found for requested labs within last 8760 hours.    Lipid Panel Lab Results  Component Value Date   CHOL 121 09/17/2014   HDL 33 (L) 09/17/2014   LDLCALC 62 09/17/2014   TRIG 129 09/17/2014      Wt Readings from Last 3 Encounters:  03/30/16 220 lb 6.4 oz (100 kg)  10/23/15 200 lb (90.7 kg)  07/25/15 218 lb (98.9 kg)       ASSESSMENT AND PLAN:  Atrial fibrillation with RVR ([place]) - Plan: EKG  12-Lead Maintaining normal sinus rhythm  Coronary artery disease involving native coronary artery of native heart without angina pectoris - Plan: EKG 12-Lead Currently with no symptoms of angina. No further workup at this time. Continue current medication regimen.  PVD (peripheral vascular disease) ([facility])  S/P CABG x 3 Denies any anginal symptoms  Borderline diabetes We have encouraged continued exercise, careful diet management in an effort to lose weight.  Hyperlipidemia Recommended he continue his Lipitor LDL at goal   Disposition:   F/U  6 months   Orders Placed This Encounter  Procedures  . EKG 12-Lead     Signed, Esmond Plants, M.D., Ph.D. 03/30/2016  [place], [place]

## 2016-03-30 NOTE — Patient Instructions (Signed)

## 2016-05-27 IMAGING — CR DG CHEST 1V PORT
1 series · 1 of 1 positions shown · non-contrast
Comparison: None.

CLINICAL DATA: Coronary artery disease

EXAM:
PORTABLE CHEST - 1 VIEW

[AP]
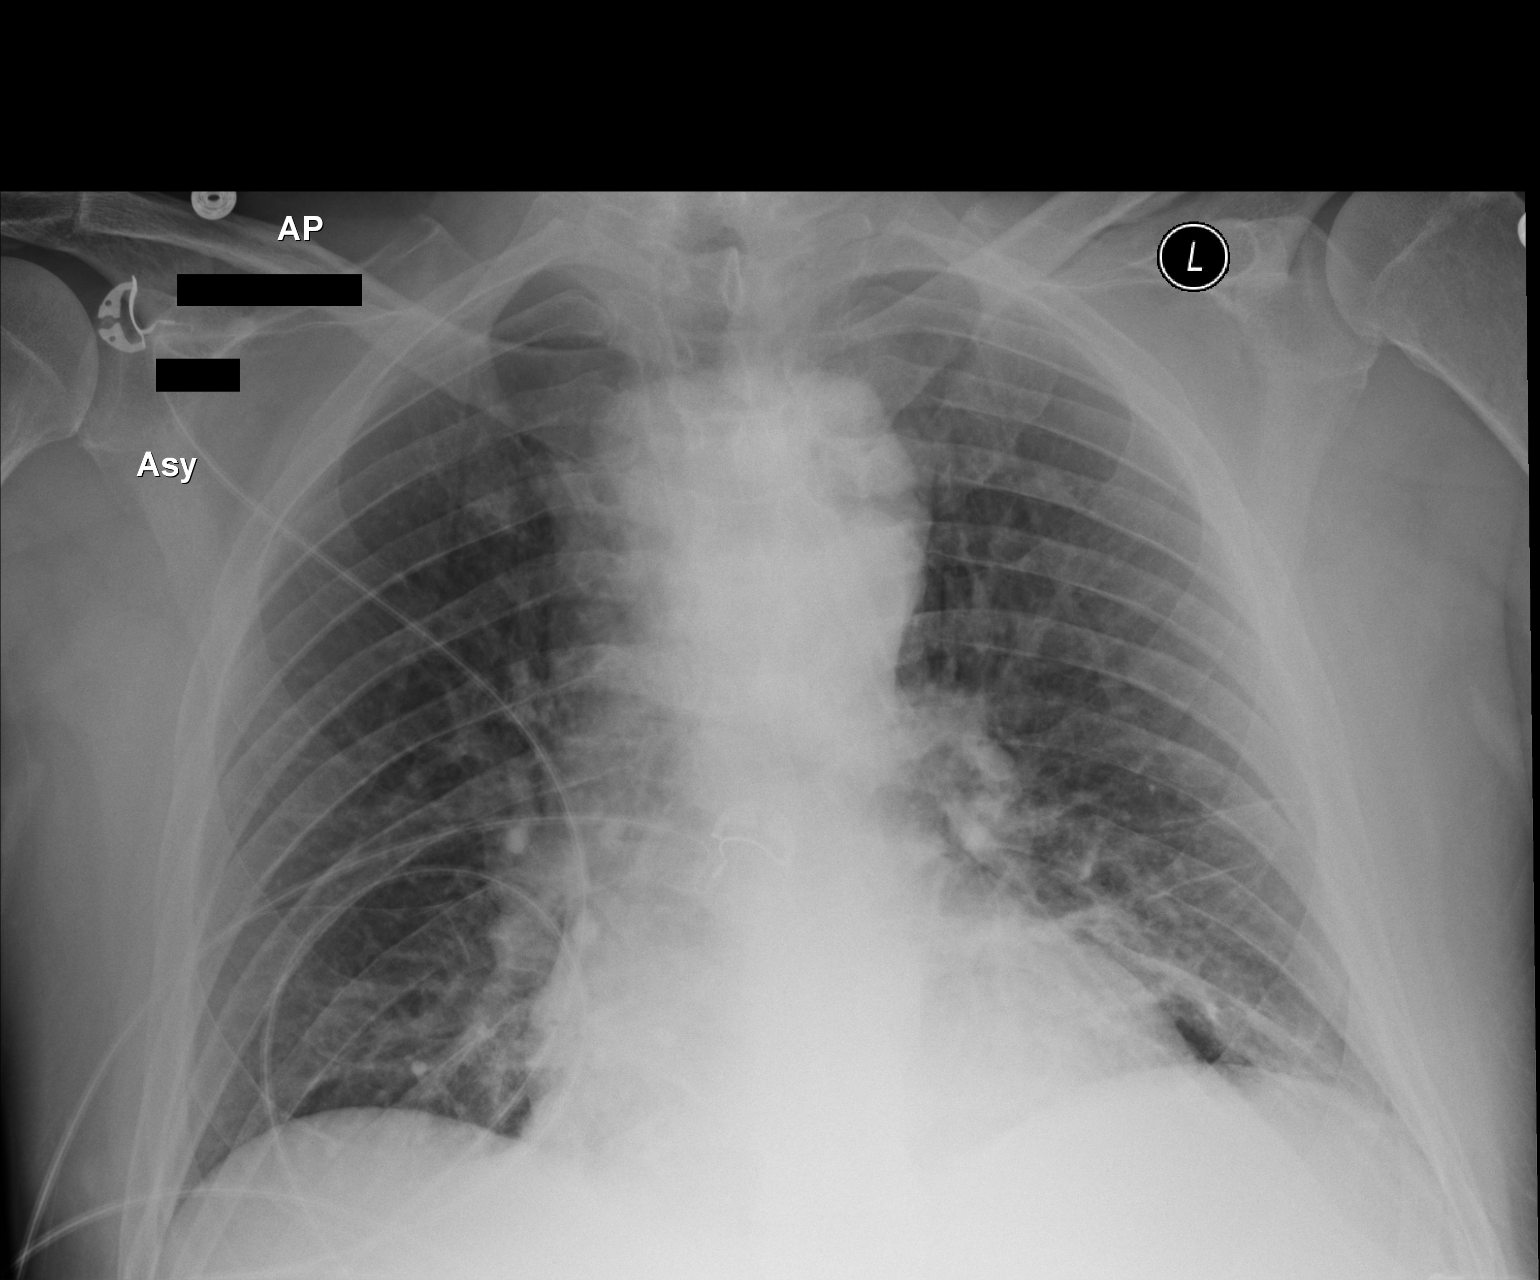

[1 of 1 positions shown; findings below may reference images not displayed]

FINDINGS: Cardiac shadow is at the upper limits of normal in size. Mild
tortuosity of the thoracic aorta is seen. The lungs are well aerated
bilaterally. Minimal left basilar atelectasis is seen.
IMPRESSION: Mild left basilar atelectasis.

## 2016-05-28 IMAGING — CR DG CHEST 1V PORT
1 series · 1 of 1 positions shown · non-contrast
Comparison: 09/20/2014

CLINICAL DATA: Post CABG

EXAM:
PORTABLE CHEST - 1 VIEW

[AP]
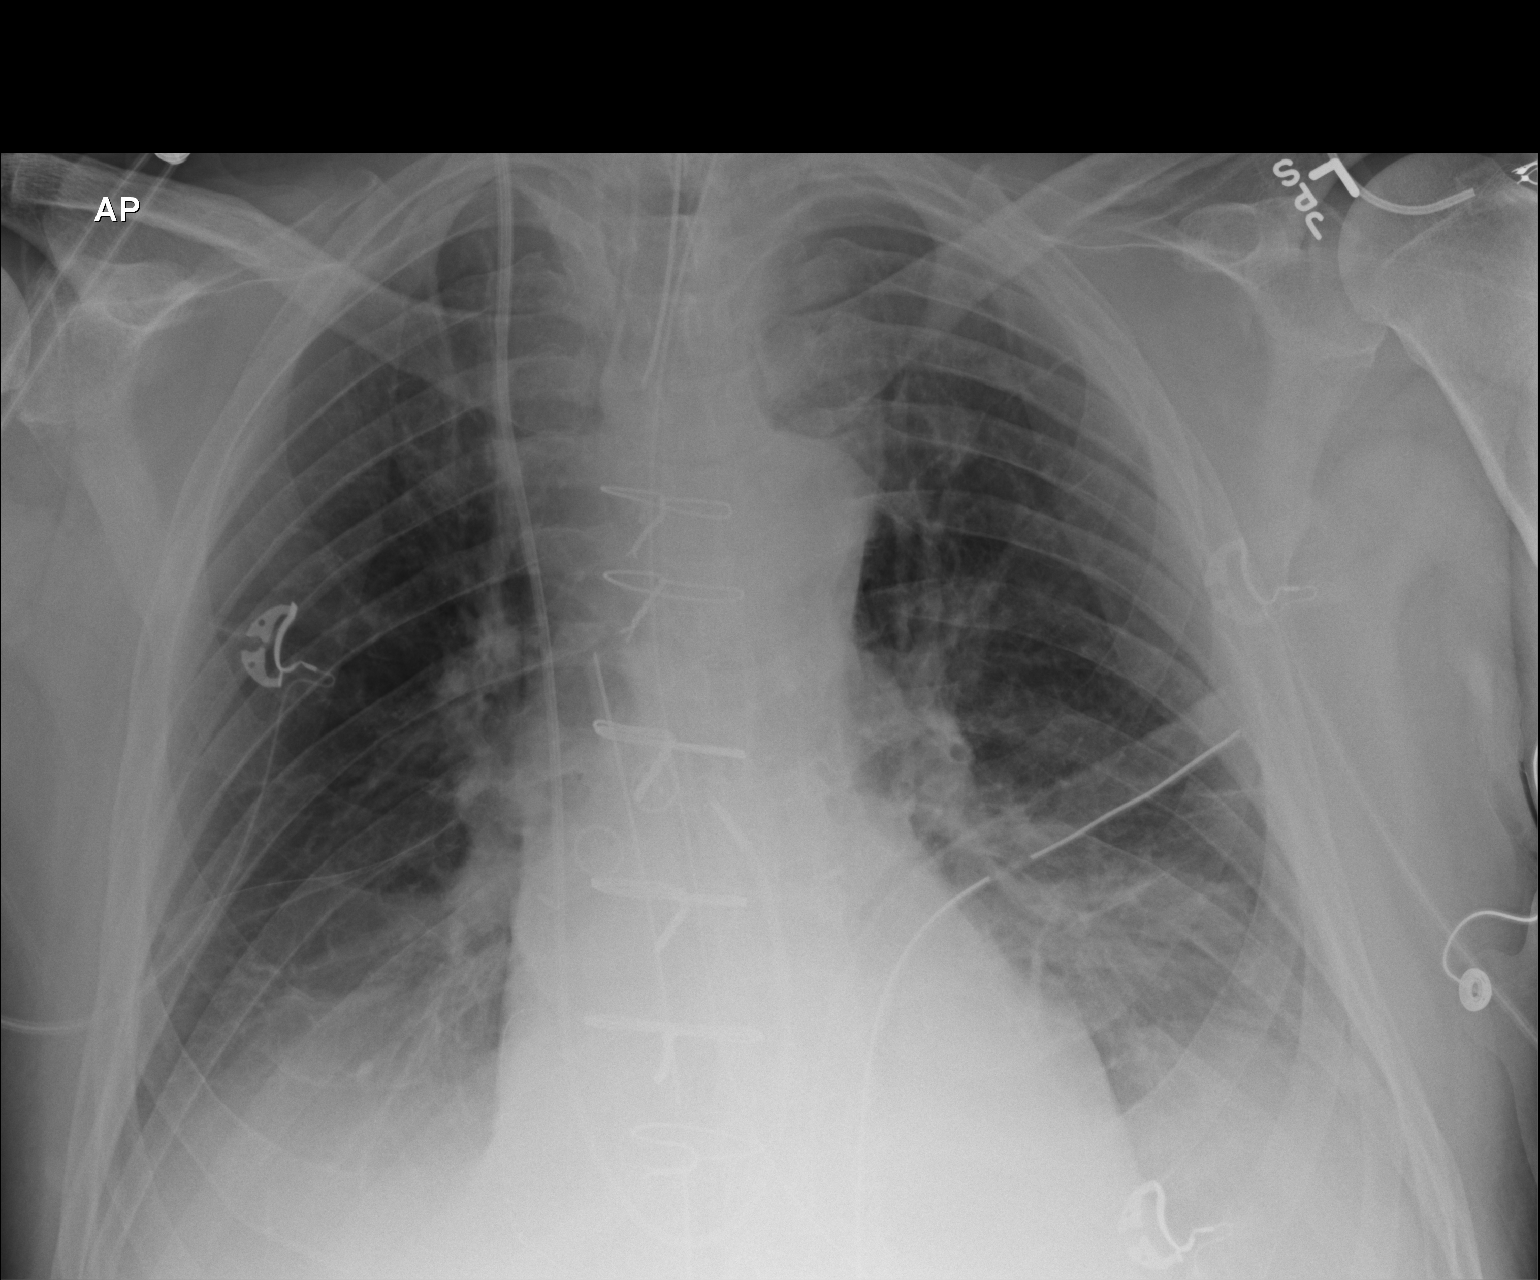

[1 of 1 positions shown; findings below may reference images not displayed]

FINDINGS: Postop CABG. Endotracheal tube in good position. NG tube in place.
Swan-Ganz catheter tip in the main pulmonary artery. Mediastinal
drain in place. Left chest tube in place. No pneumothorax

Bibasilar atelectasis. Negative for edema. No significant effusion.
IMPRESSION: Support lines in good position.

Bibasilar atelectasis.  No pneumothorax.

## 2016-05-29 ENCOUNTER — Other Ambulatory Visit: Payer: Self-pay | Admitting: Cardiovascular Disease

## 2016-07-30 ENCOUNTER — Other Ambulatory Visit: Payer: Self-pay | Admitting: Cardiovascular Disease
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
## 2016-09-20 ENCOUNTER — Ambulatory Visit (INDEPENDENT_AMBULATORY_CARE_PROVIDER_SITE_OTHER): Payer: Medicare Other | Admitting: Cardiovascular Disease

## 2016-09-20 ENCOUNTER — Encounter: Payer: Self-pay | Admitting: Cardiovascular Disease

## 2016-09-20 VITALS — BP 148/78 | HR 69 | Ht 71.0 in | Wt 218.5 lb

## 2016-09-20 DIAGNOSIS — I739 Peripheral vascular disease, unspecified: Secondary | ICD-10-CM

## 2016-09-20 DIAGNOSIS — Z951 Presence of aortocoronary bypass graft: Secondary | ICD-10-CM

## 2016-09-20 DIAGNOSIS — I200 Unstable angina: Secondary | ICD-10-CM | POA: Diagnosis not present

## 2016-09-20 DIAGNOSIS — E119 Type 2 diabetes mellitus without complications: Secondary | ICD-10-CM | POA: Diagnosis not present

## 2016-09-20 DIAGNOSIS — I4891 Unspecified atrial fibrillation: Secondary | ICD-10-CM

## 2016-09-20 DIAGNOSIS — E782 Mixed hyperlipidemia: Secondary | ICD-10-CM | POA: Diagnosis not present

## 2016-09-20 DIAGNOSIS — I10 Essential (primary) hypertension: Secondary | ICD-10-CM | POA: Diagnosis not present

## 2016-09-20 DIAGNOSIS — K219 Gastro-esophageal reflux disease without esophagitis: Secondary | ICD-10-CM | POA: Diagnosis not present

## 2016-09-20 NOTE — Patient Instructions (Signed)

## 2016-09-23 DIAGNOSIS — D485 Neoplasm of uncertain behavior of skin: Secondary | ICD-10-CM | POA: Diagnosis not present

## 2016-09-23 DIAGNOSIS — C44622 Squamous cell carcinoma of skin of right upper limb, including shoulder: Secondary | ICD-10-CM | POA: Diagnosis not present

## 2016-09-23 DIAGNOSIS — Z872 Personal history of diseases of the skin and subcutaneous tissue: Secondary | ICD-10-CM | POA: Diagnosis not present

## 2016-09-23 DIAGNOSIS — L570 Actinic keratosis: Secondary | ICD-10-CM | POA: Diagnosis not present

## 2016-09-23 DIAGNOSIS — Z86018 Personal history of other benign neoplasm: Secondary | ICD-10-CM | POA: Diagnosis not present

## 2016-10-15 DIAGNOSIS — Z1211 Encounter for screening for malignant neoplasm of colon: Secondary | ICD-10-CM | POA: Diagnosis not present

## 2016-10-15 DIAGNOSIS — E119 Type 2 diabetes mellitus without complications: Secondary | ICD-10-CM | POA: Diagnosis not present

## 2016-10-15 DIAGNOSIS — Z1212 Encounter for screening for malignant neoplasm of rectum: Secondary | ICD-10-CM | POA: Diagnosis not present

## 2016-10-15 DIAGNOSIS — I10 Essential (primary) hypertension: Secondary | ICD-10-CM | POA: Diagnosis not present

## 2016-10-15 DIAGNOSIS — I2510 Atherosclerotic heart disease of native coronary artery without angina pectoris: Secondary | ICD-10-CM | POA: Diagnosis not present

## 2016-10-15 DIAGNOSIS — Z0000 Encounter for general adult medical examination without abnormal findings: Secondary | ICD-10-CM | POA: Diagnosis not present

## 2016-10-15 DIAGNOSIS — E785 Hyperlipidemia, unspecified: Secondary | ICD-10-CM | POA: Diagnosis not present

## 2016-10-18 DIAGNOSIS — H40053 Ocular hypertension, bilateral: Secondary | ICD-10-CM | POA: Diagnosis not present

## 2016-10-27 DIAGNOSIS — L570 Actinic keratosis: Secondary | ICD-10-CM | POA: Diagnosis not present

## 2016-10-27 DIAGNOSIS — C44622 Squamous cell carcinoma of skin of right upper limb, including shoulder: Secondary | ICD-10-CM | POA: Diagnosis not present

## 2016-10-27 DIAGNOSIS — Z0000 Encounter for general adult medical examination without abnormal findings: Secondary | ICD-10-CM | POA: Diagnosis not present

## 2016-10-27 DIAGNOSIS — Z1212 Encounter for screening for malignant neoplasm of rectum: Secondary | ICD-10-CM | POA: Diagnosis not present

## 2016-10-27 DIAGNOSIS — Z1211 Encounter for screening for malignant neoplasm of colon: Secondary | ICD-10-CM | POA: Diagnosis not present

## 2016-11-09 DIAGNOSIS — L821 Other seborrheic keratosis: Secondary | ICD-10-CM | POA: Diagnosis not present

## 2016-12-21 DIAGNOSIS — M5441 Lumbago with sciatica, right side: Secondary | ICD-10-CM | POA: Diagnosis not present

## 2016-12-21 DIAGNOSIS — M545 Low back pain: Secondary | ICD-10-CM | POA: Diagnosis not present

## 2017-01-26 DIAGNOSIS — Z6830 Body mass index (BMI) 30.0-30.9, adult: Secondary | ICD-10-CM | POA: Diagnosis not present

## 2017-01-26 DIAGNOSIS — R7309 Other abnormal glucose: Secondary | ICD-10-CM | POA: Diagnosis not present

## 2017-02-10 DIAGNOSIS — H40013 Open angle with borderline findings, low risk, bilateral: Secondary | ICD-10-CM | POA: Diagnosis not present

## 2017-02-11 DIAGNOSIS — E119 Type 2 diabetes mellitus without complications: Secondary | ICD-10-CM | POA: Diagnosis not present

## 2017-02-16 DIAGNOSIS — E119 Type 2 diabetes mellitus without complications: Secondary | ICD-10-CM | POA: Diagnosis not present

## 2017-02-16 DIAGNOSIS — I10 Essential (primary) hypertension: Secondary | ICD-10-CM | POA: Diagnosis not present
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
## 2017-03-22 ENCOUNTER — Ambulatory Visit (INDEPENDENT_AMBULATORY_CARE_PROVIDER_SITE_OTHER): Payer: Medicare Other | Admitting: Cardiovascular Disease

## 2017-03-22 ENCOUNTER — Encounter: Payer: Self-pay | Admitting: Cardiovascular Disease

## 2017-03-22 VITALS — BP 124/70 | HR 75 | Ht 71.0 in | Wt 217.5 lb

## 2017-03-22 DIAGNOSIS — I200 Unstable angina: Secondary | ICD-10-CM

## 2017-03-22 DIAGNOSIS — Z951 Presence of aortocoronary bypass graft: Secondary | ICD-10-CM | POA: Diagnosis not present

## 2017-03-22 DIAGNOSIS — I209 Angina pectoris, unspecified: Secondary | ICD-10-CM | POA: Diagnosis not present

## 2017-03-22 DIAGNOSIS — I25118 Atherosclerotic heart disease of native coronary artery with other forms of angina pectoris: Secondary | ICD-10-CM

## 2017-03-22 DIAGNOSIS — I739 Peripheral vascular disease, unspecified: Secondary | ICD-10-CM

## 2017-03-22 DIAGNOSIS — E782 Mixed hyperlipidemia: Secondary | ICD-10-CM | POA: Diagnosis not present

## 2017-03-22 DIAGNOSIS — I4891 Unspecified atrial fibrillation: Secondary | ICD-10-CM

## 2017-03-22 NOTE — Patient Instructions (Addendum)

## 2017-04-17 ENCOUNTER — Encounter: Payer: Self-pay | Admitting: Emergency Medicine

## 2017-04-17 ENCOUNTER — Other Ambulatory Visit: Payer: Self-pay

## 2017-04-17 ENCOUNTER — Ambulatory Visit
Admission: EM | Admit: 2017-04-17 | Discharge: 2017-04-17 | Disposition: A | Discharge status: Home / Self Care | Payer: Medicare Other | Attending: Family Medicine | Admitting: Family Medicine

## 2017-04-17 DIAGNOSIS — M549 Dorsalgia, unspecified: Secondary | ICD-10-CM | POA: Diagnosis not present

## 2017-04-17 DIAGNOSIS — M542 Cervicalgia: Secondary | ICD-10-CM

## 2017-04-17 MED ORDER — TRAMADOL HCL 50 MG PO TABS: 50.0000 mg | ORAL_TABLET | Freq: Three times a day (TID) | ORAL | 0 refills | Status: DC | PRN
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
## 2017-05-05 DIAGNOSIS — L578 Other skin changes due to chronic exposure to nonionizing radiation: Secondary | ICD-10-CM | POA: Diagnosis not present

## 2017-05-05 DIAGNOSIS — L821 Other seborrheic keratosis: Secondary | ICD-10-CM | POA: Diagnosis not present

## 2017-05-05 DIAGNOSIS — L0109 Other impetigo: Secondary | ICD-10-CM | POA: Diagnosis not present

## 2017-05-05 DIAGNOSIS — L570 Actinic keratosis: Secondary | ICD-10-CM | POA: Diagnosis not present

## 2017-05-05 DIAGNOSIS — L738 Other specified follicular disorders: Secondary | ICD-10-CM | POA: Diagnosis not present

## 2017-05-08 ENCOUNTER — Other Ambulatory Visit: Payer: Self-pay | Admitting: Cardiovascular Disease

## 2017-06-03 DIAGNOSIS — S233XXA Sprain of ligaments of thoracic spine, initial encounter: Secondary | ICD-10-CM | POA: Diagnosis not present

## 2017-06-03 DIAGNOSIS — S335XXA Sprain of ligaments of lumbar spine, initial encounter: Secondary | ICD-10-CM | POA: Diagnosis not present

## 2017-06-03 DIAGNOSIS — S134XXA Sprain of ligaments of cervical spine, initial encounter: Secondary | ICD-10-CM | POA: Diagnosis not present

## 2017-06-06 DIAGNOSIS — S335XXD Sprain of ligaments of lumbar spine, subsequent encounter: Secondary | ICD-10-CM | POA: Diagnosis not present

## 2017-06-06 DIAGNOSIS — S134XXD Sprain of ligaments of cervical spine, subsequent encounter: Secondary | ICD-10-CM | POA: Diagnosis not present

## 2017-06-06 DIAGNOSIS — S233XXD Sprain of ligaments of thoracic spine, subsequent encounter: Secondary | ICD-10-CM | POA: Diagnosis not present

## 2017-06-09 DIAGNOSIS — S233XXD Sprain of ligaments of thoracic spine, subsequent encounter: Secondary | ICD-10-CM | POA: Diagnosis not present

## 2017-06-09 DIAGNOSIS — S134XXD Sprain of ligaments of cervical spine, subsequent encounter: Secondary | ICD-10-CM | POA: Diagnosis not present

## 2017-06-09 DIAGNOSIS — S335XXD Sprain of ligaments of lumbar spine, subsequent encounter: Secondary | ICD-10-CM | POA: Diagnosis not present

## 2017-06-16 DIAGNOSIS — S233XXD Sprain of ligaments of thoracic spine, subsequent encounter: Secondary | ICD-10-CM | POA: Diagnosis not present

## 2017-06-16 DIAGNOSIS — S134XXD Sprain of ligaments of cervical spine, subsequent encounter: Secondary | ICD-10-CM | POA: Diagnosis not present

## 2017-06-16 DIAGNOSIS — S335XXD Sprain of ligaments of lumbar spine, subsequent encounter: Secondary | ICD-10-CM | POA: Diagnosis not present

## 2017-06-22 ENCOUNTER — Other Ambulatory Visit: Payer: Self-pay | Admitting: Cardiovascular Disease

## 2017-06-30 DIAGNOSIS — S335XXD Sprain of ligaments of lumbar spine, subsequent encounter: Secondary | ICD-10-CM | POA: Diagnosis not present

## 2017-06-30 DIAGNOSIS — S233XXD Sprain of ligaments of thoracic spine, subsequent encounter: Secondary | ICD-10-CM | POA: Diagnosis not present

## 2017-06-30 DIAGNOSIS — S134XXD Sprain of ligaments of cervical spine, subsequent encounter: Secondary | ICD-10-CM | POA: Diagnosis not present

## 2017-07-31 ENCOUNTER — Other Ambulatory Visit: Payer: Self-pay | Admitting: Cardiovascular Disease

## 2017-08-01 NOTE — Telephone Encounter (Signed)
Please review for refill on omeprazole.  The patient's med list from the last office visit with Dr. Rockey Situ is showing Nexium.  Please advise.

## 2017-08-01 NOTE — Telephone Encounter (Signed)
S/w patient. He is currently taking Nexium. Advised patient to call his PCP for refill. Omeprazole refill refused. Patient verbalized understanding.

## 2017-08-16 DIAGNOSIS — E119 Type 2 diabetes mellitus without complications: Secondary | ICD-10-CM | POA: Diagnosis not present

## 2017-08-16 DIAGNOSIS — I10 Essential (primary) hypertension: Secondary | ICD-10-CM | POA: Diagnosis not present

## 2017-08-31 DIAGNOSIS — E785 Hyperlipidemia, unspecified: Secondary | ICD-10-CM | POA: Diagnosis not present

## 2017-08-31 DIAGNOSIS — E119 Type 2 diabetes mellitus without complications: Secondary | ICD-10-CM | POA: Diagnosis not present

## 2017-08-31 DIAGNOSIS — I2510 Atherosclerotic heart disease of native coronary artery without angina pectoris: Secondary | ICD-10-CM | POA: Diagnosis not present

## 2017-08-31 DIAGNOSIS — I10 Essential (primary) hypertension: Secondary | ICD-10-CM | POA: Diagnosis not present

## 2017-09-05 ENCOUNTER — Encounter: Payer: Self-pay | Admitting: *Deleted

## 2017-09-05 ENCOUNTER — Ambulatory Visit: Payer: Medicare Other

## 2017-09-05 ENCOUNTER — Ambulatory Visit
Admission: EM | Admit: 2017-09-05 | Discharge: 2017-09-05 | Disposition: A | Discharge status: Home / Self Care | Payer: Medicare Other | Attending: Family Medicine | Admitting: Family Medicine

## 2017-09-05 DIAGNOSIS — Z87891 Personal history of nicotine dependence: Secondary | ICD-10-CM | POA: Diagnosis not present

## 2017-09-05 DIAGNOSIS — Z7982 Long term (current) use of aspirin: Secondary | ICD-10-CM | POA: Insufficient documentation

## 2017-09-05 DIAGNOSIS — Z79899 Other long term (current) drug therapy: Secondary | ICD-10-CM | POA: Diagnosis not present

## 2017-09-05 DIAGNOSIS — R079 Chest pain, unspecified: Secondary | ICD-10-CM | POA: Diagnosis not present

## 2017-09-05 DIAGNOSIS — Z7984 Long term (current) use of oral hypoglycemic drugs: Secondary | ICD-10-CM | POA: Diagnosis not present

## 2017-09-05 DIAGNOSIS — M546 Pain in thoracic spine: Secondary | ICD-10-CM | POA: Diagnosis not present

## 2017-09-05 MED ORDER — HYDROCODONE-ACETAMINOPHEN 5-325 MG PO TABS: ORAL_TABLET | ORAL | 0 refills | Status: DC

## 2017-09-05 NOTE — Discharge Instructions (Addendum)
Rest, heat 
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
## 2017-09-05 NOTE — ED Provider Notes (Signed)
[facility]    CSN: [id number] Arrival date & time: 09/05/17  1216     History   Chief Complaint Chief Complaint  Patient presents with  . Back Pain    HPI Christopher [name] is a 84 y.o. male.   The history is provided by the patient.  Back Pain  Location:  Thoracic spine Quality:  Aching Radiates to: the sides. Pain severity:  Moderate Onset quality:  Sudden Duration:  3 days Timing:  Constant Progression:  Waxing and waning Chronicity:  New Context comment:  Unknown; denies falls, injuries, rash, cough, fevers, chills, recent travel Relieved by:  None tried Worsened by:  Twisting, deep breathing, coughing and palpation Ineffective treatments:  None tried Associated symptoms: no abdominal pain, no abdominal swelling, no bladder incontinence, no bowel incontinence, no chest pain, no dysuria, no fever, no headaches, no leg pain, no numbness, no paresthesias, no pelvic pain, no perianal numbness, no tingling, no weakness and no weight loss   Risk factors: no recent surgery     Past Medical History:  Diagnosis Date  . Carotid artery stenosis    a. RICA 40%  . Coronary artery disease    a. PCI/DES to mLAD and dRCA in 2005, residual OM1 50%; b. nuclear stress test 02/2008: no ischemia; c. nuclear stress test 11/2009: mild ischemia in the inferior wall c/w previous stress test; d. cath 09/17/2014: critical ostial/prox LAD estimated at 90-95%, critical prox LCx, RCA w/ mild diff dz, EF >55%, no WMA  . GERD (gastroesophageal reflux disease)   . Hyperlipidemia   . Hypertension   . PVD (peripheral vascular disease) [facility])     Patient Active Problem List   Diagnosis Date Noted  . S/P CABG x 3 09/20/2014  . Atrial fibrillation with RVR ([place]) 09/19/2014  . NSTEMI (non-ST elevated myocardial infarction) ([place]) 09/19/2014  . Borderline diabetes 06/12/2014  . Hepatotoxicity due to statin drug 11/02/2012  . PVD (peripheral vascular disease) ([place]) 05/19/2011  . Coronary  atherosclerosis 12/18/2009  . Mixed hyperlipidemia 06/19/2009    Past Surgical History:  Procedure Laterality Date  . CARDIAC CATHETERIZATION    . CHOLECYSTECTOMY  2016  . CORONARY ARTERY BYPASS GRAFT N/A 09/20/2014   Procedure: CORONARY ARTERY BYPASS GRAFTING (CABG) x   three using left internal mammary artery and right leg greater saphenous vein harvested endoscopically;  Surgeon: Melrose Nakayama, MD;  Location: [place];  Service: Open Heart Surgery;  Laterality: N/A;  . TEE WITHOUT CARDIOVERSION N/A 09/20/2014   Procedure: TRANSESOPHAGEAL ECHOCARDIOGRAM (TEE);  Surgeon: Melrose Nakayama, MD;  Location: [place];  Service: Open Heart Surgery;  Laterality: N/A;  . TOTAL KNEE ARTHROPLASTY     bilateral       Home Medications    Prior to Admission medications   Medication Sig Start Date End Date Taking? Authorizing Provider  aspirin 81 MG EC tablet Take 81 mg by mouth daily.     Yes [provider]  atorvastatin (LIPITOR) 20 MG tablet Take 1 tablet (20 mg total) by mouth daily. 10/18/14  Yes Minna Merritts, MD  clopidogrel (PLAVIX) 75 MG tablet TAKE 1 TABLET BY MOUTH EVERY DAY 06/22/17  Yes Gollan, Kathlene November, MD  esomeprazole (NEXIUM) 20 MG capsule Take 20 mg by mouth daily at 12 noon.   Yes [provider]  metFORMIN (GLUCOPHAGE) 500 MG tablet Take 1 tablet (500 mg total) by mouth daily with breakfast. 03/30/16  Yes Gollan, Kathlene November, MD  metoprolol tartrate (LOPRESSOR)  25 MG tablet Take 1 tablet (25 mg total) by mouth 2 (two) times daily. Patient taking differently: Take 25 mg by mouth daily.  09/26/14  Yes Barrett, Erin R, PA-C  HYDROcodone-acetaminophen (NORCO/VICODIN) 5-325 MG tablet 1-2 tabs po q 8 hours prn 09/05/17   Norval Gable, MD  traMADol (ULTRAM) 50 MG tablet Take 1 tablet (50 mg total) every 8 (eight) hours as needed by mouth. 04/17/17   Coral Spikes, DO    Family History Family History  Problem Relation Age of Onset  . Heart failure Mother      Social History Social History   Tobacco Use  . Smoking status: Former Research scientist (life sciences)  . Smokeless tobacco: Never Used  Substance Use Topics  . Alcohol use: No  . Drug use: No     Allergies   Patient has no known allergies.   Review of Systems Review of Systems  Constitutional: Negative for fever and weight loss.  Cardiovascular: Negative for chest pain.  Gastrointestinal: Negative for abdominal pain and bowel incontinence.  Genitourinary: Negative for bladder incontinence, dysuria and pelvic pain.  Musculoskeletal: Positive for back pain.  Neurological: Negative for tingling, weakness, numbness, headaches and paresthesias.     Physical Exam Triage Vital Signs ED Triage Vitals  Enc Vitals Group     BP 09/05/17 1232 (!) 148/83     Pulse Rate 09/05/17 1232 65     Resp 09/05/17 1232 18     Temp 09/05/17 1232 97.9 F (36.6 C)     Temp Source 09/05/17 1232 Oral     SpO2 09/05/17 1232 98 %     Weight 09/05/17 1229 219 lb (99.3 kg)     Height 09/05/17 1229 5\' 11"  (1.803 m)     Head Circumference --      Peak Flow --      Pain Score 09/05/17 1227 10     Pain Loc --      Pain Edu? --      Excl. in [place]? --    No data found.  Updated Vital Signs BP (!) 148/83 (BP Location: Left Arm)   Pulse 65   Temp 97.9 F (36.6 C) (Oral)   Resp 18   Ht 5\' 11"  (1.803 m)   Wt 219 lb (99.3 kg)   SpO2 98%   BMI 30.54 kg/m   Visual Acuity Right Eye Distance:   Left Eye Distance:   Bilateral Distance:    Right Eye Near:   Left Eye Near:    Bilateral Near:     Physical Exam  Constitutional: He is oriented to person, place, and time. He appears well-developed and well-nourished. No distress.  Neck: Normal range of motion. Neck supple. No tracheal deviation present.  Pulmonary/Chest: Effort normal. No stridor. No respiratory distress.  Musculoskeletal:       Thoracic back: He exhibits tenderness (over the paraspinous muscles bilaterally) and spasm. He exhibits no bony tenderness  and no swelling.  Neurological: He is alert and oriented to person, place, and time. He has normal reflexes. He displays normal reflexes. He exhibits normal muscle tone. Coordination normal.  Skin: No rash noted. He is not diaphoretic.  Nursing note and vitals reviewed.    UC Treatments / Results  Labs (all labs ordered are listed, but only abnormal results are displayed) Labs Reviewed - No data to display  EKG None Radiology Dg Chest 2 View  Result Date: 09/05/2017 CLINICAL DATA:  Chest pain. EXAM: CHEST - 2 VIEW COMPARISON:  Radiographs of November 04, 2014. FINDINGS: The heart size and mediastinal contours are within normal limits. Status post coronary bypass graft. Stable linear bilateral lung opacities are noted most consistent with scarring. No acute pulmonary disease is noted. The visualized skeletal structures are unremarkable. IMPRESSION: No active cardiopulmonary disease. Electronically Signed   By: Marijo Conception, M.D.   On: 09/05/2017 13:39   Dg Thoracic Spine 2 View  Result Date: 09/05/2017 CLINICAL DATA:  Thoracic spine pain. EXAM: THORACIC SPINE 2 VIEWS COMPARISON:  Radiographs of November 04, 2014. FINDINGS: No fracture or spondylolisthesis is noted. Minimal anterior osteophyte formation is noted in the lower thoracic spine. IMPRESSION: No significant abnormality seen in the thoracic spine. Electronically Signed   By: Marijo Conception, M.D.   On: 09/05/2017 13:41    Procedures Procedures (including critical care time)  Medications Ordered in UC Medications - No data to display   Initial Impression / Assessment and Plan / UC Course  I have reviewed the triage vital signs and the nursing notes.  Pertinent labs & imaging results that were available during my care of the patient were reviewed by me and considered in my medical decision making (see chart for details).       Final Clinical Impressions(s) / UC Diagnoses   Final diagnoses:  Acute bilateral thoracic back pain   (muscular)  ED Discharge Orders        Ordered    HYDROcodone-acetaminophen (NORCO/VICODIN) 5-325 MG tablet     09/05/17 1358     1. x-ray results and diagnosis reviewed with patient 2. rx as per orders above; reviewed possible side effects, interactions, risks and benefits  3. Recommend supportive treatment with rest, heat to area 4. Follow-up prn if symptoms worsen or don't improve  Controlled Substance Prescriptions [place] Controlled Substance Registry consulted? Not Applicable   Norval Gable, MD 09/05/17 [phone]

## 2017-09-05 NOTE — ED Triage Notes (Signed)
Pt states back hurt more when he breaths.

## 2017-09-08 ENCOUNTER — Other Ambulatory Visit
Admission: RE | Admit: 2017-09-08 | Discharge: 2017-09-08 | Disposition: A | Discharge status: Home / Self Care | Payer: Medicare Other | Source: Ambulatory Visit | Attending: Family Medicine | Admitting: Family Medicine

## 2017-09-08 DIAGNOSIS — R0781 Pleurodynia: Secondary | ICD-10-CM | POA: Insufficient documentation

## 2017-09-08 DIAGNOSIS — M546 Pain in thoracic spine: Secondary | ICD-10-CM | POA: Diagnosis not present

## 2017-09-08 LAB — FIBRIN DERIVATIVES D-DIMER (ARMC ONLY): FIBRIN DERIVATIVES D-DIMER (ARMC): 353.44 ng{FEU}/mL (ref 0.00–499.00)

## 2017-09-09 ENCOUNTER — Other Ambulatory Visit: Payer: Self-pay | Admitting: Cardiovascular Disease

## 2017-10-20 DIAGNOSIS — H353131 Nonexudative age-related macular degeneration, bilateral, early dry stage: Secondary | ICD-10-CM | POA: Diagnosis not present

## 2018-03-08 DIAGNOSIS — I480 Paroxysmal atrial fibrillation: Secondary | ICD-10-CM | POA: Diagnosis not present

## 2018-03-08 DIAGNOSIS — Z23 Encounter for immunization: Secondary | ICD-10-CM | POA: Diagnosis not present

## 2018-03-08 DIAGNOSIS — E785 Hyperlipidemia, unspecified: Secondary | ICD-10-CM | POA: Diagnosis not present

## 2018-03-08 DIAGNOSIS — K219 Gastro-esophageal reflux disease without esophagitis: Secondary | ICD-10-CM | POA: Diagnosis not present

## 2018-03-08 DIAGNOSIS — M19041 Primary osteoarthritis, right hand: Secondary | ICD-10-CM | POA: Diagnosis not present

## 2018-03-08 DIAGNOSIS — I2510 Atherosclerotic heart disease of native coronary artery without angina pectoris: Secondary | ICD-10-CM | POA: Diagnosis not present

## 2018-03-08 DIAGNOSIS — E1151 Type 2 diabetes mellitus with diabetic peripheral angiopathy without gangrene: Secondary | ICD-10-CM | POA: Diagnosis not present

## 2018-03-08 DIAGNOSIS — I10 Essential (primary) hypertension: Secondary | ICD-10-CM | POA: Diagnosis not present

## 2018-03-09 DIAGNOSIS — E785 Hyperlipidemia, unspecified: Secondary | ICD-10-CM | POA: Diagnosis not present

## 2018-03-09 DIAGNOSIS — E1151 Type 2 diabetes mellitus with diabetic peripheral angiopathy without gangrene: Secondary | ICD-10-CM | POA: Diagnosis not present

## 2018-03-29 DIAGNOSIS — M25541 Pain in joints of right hand: Secondary | ICD-10-CM | POA: Diagnosis not present

## 2018-03-29 DIAGNOSIS — M19041 Primary osteoarthritis, right hand: Secondary | ICD-10-CM | POA: Diagnosis not present

## 2018-05-08 NOTE — Progress Notes (Signed)
Cardiology Office Note  Date:  05/09/2018   ID:  Christopher [name] [birth year], MRN [id number]  PCP:  Leonel Ramsay, MD   Chief Complaint  Patient presents with  . other    12 month follow up. Meds reviewed by the pt. verbally. "doing well."     HPI:  [name] is a very pleasant 84 year old gentleman with a history of coronary artery disease,  stent placed to his distal RCA in 2005, stent placed to his mid LAD, catheterization 09/17/2014 showing critical ostial LAD and proximal left circumflex disease, transferred to [facility] for bypass surgery,  Postoperatively had atrial fibrillation, started on anticoagulation and amiodarone who presents today for follow-up of his cad and  three-vessel CABG.  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 At the rehabilitation, he was receiving atorvastatin 80 mg daily with Vytorin (dose unclear). He became very ill, had chills, rigors, sweating, urine was very dark. He was noticed to be jaundice and checked himself out of rehabilitation and went to the hospital, noted  to have hepatitis. Symptoms resolved by holding the statin's. LFTs improved back to normal  Lipitor was restarted at low dose 20 mg   Prior episode of severe dizziness. He was driving at the time and had to pullover. Symptoms resolved without intervention. That night he developed upper respiratory infection with runny nose. Currently has severe cough and  unable to sleep. Additional mild episode of dizziness several days later. No further episodes since then  Cardiac catheter September 2005 shows 90% mid LAD, 80% distal RCA, 50% OM1 at the mid vessel, Taxus stent 2.75 x 24 mm stent to his LAD, TAxus stent 2.5 x 16 mm stent to the RCA.   stress test in July 2011 showed mild ischemia in the inferior wall consistent with previous stress test     PMH:   has a past medical history of Carotid artery stenosis, Coronary artery disease, GERD (gastroesophageal reflux disease), Hyperlipidemia, Hypertension, and PVD (peripheral vascular disease) ([place]).  PSH:    Past Surgical History:  Procedure Laterality Date  . CARDIAC CATHETERIZATION    . CHOLECYSTECTOMY  2016  . CORONARY ARTERY BYPASS GRAFT N/A 09/20/2014   Procedure: CORONARY ARTERY BYPASS GRAFTING (CABG) x   three using left internal mammary artery and right leg greater saphenous vein harvested endoscopically;  Surgeon: Melrose Nakayama, MD;  Location: [place];  Service: Open Heart Surgery;  Laterality: N/A;  . TEE WITHOUT CARDIOVERSION N/A 09/20/2014   Procedure: TRANSESOPHAGEAL ECHOCARDIOGRAM (TEE);  Surgeon: Melrose Nakayama, MD;  Location: [facility]  OR;  Service: Open Heart Surgery;  Laterality: N/A;  . TOTAL KNEE ARTHROPLASTY     bilateral    Current Outpatient Medications  Medication Sig Dispense Refill  . aspirin 81 MG EC tablet Take 81 mg by mouth daily.      Marland Kitchen atorvastatin (LIPITOR) 20 MG tablet Take 1 tablet (20 mg total) by mouth daily. 90 tablet 3  . clopidogrel (PLAVIX) 75 MG tablet TAKE 1 TABLET BY MOUTH EVERY DAY 90 tablet 3  .  esomeprazole (NEXIUM) 20 MG capsule Take 20 mg by mouth daily at 12 noon.    . metFORMIN (GLUCOPHAGE) 500 MG tablet Take 1 tablet (500 mg total) by mouth daily with breakfast.    . metoprolol tartrate (LOPRESSOR) 25 MG tablet TAKE 1/2 TABLET BY MOUTH TWICE A DAY 90 tablet 3   No current facility-administered medications for this visit.      Allergies:   Patient has no known allergies.   Social History:  The patient  reports that he has quit smoking. He has never used smokeless tobacco. He reports that he does not drink alcohol or use drugs.   Family History:   family history includes Heart failure in his mother.    Review of Systems: Review of Systems  Constitutional: Negative.        Weight gain  Respiratory: Negative.   Cardiovascular: Negative.   Gastrointestinal: Negative.   Musculoskeletal: Negative.   Neurological: Negative.   Psychiatric/Behavioral: Negative.   All other systems reviewed and are negative.    PHYSICAL EXAM: VS:  BP 140/80 (BP Location: Left Arm, Patient Position: Sitting, Cuff Size: Normal)   Pulse 65   Ht 5\' 11"  (1.803 m)   Wt 218 lb 8 oz (99.1 kg)   BMI 30.47 kg/m  , BMI Body mass index is 30.47 kg/m.  Constitutional:  oriented to person, place, and time. No distress.  Obese HENT:  Head: Grossly normal Eyes:  no discharge. No scleral icterus.  Neck: No JVD, no carotid bruits  Cardiovascular: Regular rate and rhythm, no murmurs appreciated Pulmonary/Chest: Clear to auscultation bilaterally, no wheezes or rails Abdominal: Soft.  no distension.  no tenderness.  Musculoskeletal: Normal range of motion Neurological:  normal muscle tone. Coordination normal. No atrophy Skin: Skin warm and dry Psychiatric: normal affect, pleasant   Recent Labs: No results found for requested labs within last 8760 hours.    Lipid Panel Lab Results  Component Value Date   CHOL 121 09/17/2014   HDL 33 (L) 09/17/2014   LDLCALC 62 09/17/2014   TRIG 129  09/17/2014      Wt Readings from Last 3 Encounters:  05/09/18 218 lb 8 oz (99.1 kg)  09/05/17 219 lb (99.3 kg)  04/17/17 217 lb (98.4 kg)     ASSESSMENT AND PLAN:   Atrial fibrillation with RVR (HCC)  Denies any palpitations concerning for arrhythmia Recommended weight loss  Coronary artery disease involving native coronary artery of native heart without angina pectoris -  Recommended regular walking progra home  No further testing at this time.  Nuys any anginal symptoms  PVD (peripheral vascular disease) (HCC) Cholesterol at goal No claudication symptoms  S/P CABG x 3 Recommended regular walking for conditioning EKG unchanged, lipids within goal and hemoglobin A1c improving Long discussion today concerning need to increase his exercise  Type 2 diabetes with complications No longer working out at [employer].  Dietary guide provided, long discussion about his need to turn things around  Hyperlipidemia  continue his Lipitor 20 daily LDL at goal Recommended weight loss, change in his diet  Long discussion concerning diet and lifestyle modification Girlfriend present, all questions answered  Total encounter time more than 25 minutes  Greater than 50% was spent in counseling and coordination of care with the patient    Disposition:   F/U  12 months    Orders Placed This Encounter  Procedures  . EKG 12-Lead     Signed, Esmond Plants, M.D., Ph.D. 05/09/2018  [place], [place]

## 2018-05-09 ENCOUNTER — Encounter: Payer: Self-pay | Admitting: Cardiovascular Disease

## 2018-05-09 ENCOUNTER — Ambulatory Visit (INDEPENDENT_AMBULATORY_CARE_PROVIDER_SITE_OTHER): Payer: Medicare Other | Admitting: Cardiovascular Disease

## 2018-05-09 VITALS — BP 140/80 | HR 65 | Ht 71.0 in | Wt 218.5 lb

## 2018-05-09 DIAGNOSIS — I25118 Atherosclerotic heart disease of native coronary artery with other forms of angina pectoris: Secondary | ICD-10-CM | POA: Diagnosis not present

## 2018-05-09 DIAGNOSIS — Z951 Presence of aortocoronary bypass graft: Secondary | ICD-10-CM

## 2018-05-09 DIAGNOSIS — E782 Mixed hyperlipidemia: Secondary | ICD-10-CM

## 2018-05-09 DIAGNOSIS — I4891 Unspecified atrial fibrillation: Secondary | ICD-10-CM

## 2018-05-09 DIAGNOSIS — I739 Peripheral vascular disease, unspecified: Secondary | ICD-10-CM

## 2018-05-09 NOTE — Patient Instructions (Signed)

## 2018-06-15 DIAGNOSIS — Z86018 Personal history of other benign neoplasm: Secondary | ICD-10-CM | POA: Diagnosis not present

## 2018-06-15 DIAGNOSIS — Z1283 Encounter for screening for malignant neoplasm of skin: Secondary | ICD-10-CM | POA: Diagnosis not present

## 2018-06-15 DIAGNOSIS — Z872 Personal history of diseases of the skin and subcutaneous tissue: Secondary | ICD-10-CM | POA: Diagnosis not present

## 2018-06-15 DIAGNOSIS — C44622 Squamous cell carcinoma of skin of right upper limb, including shoulder: Secondary | ICD-10-CM | POA: Diagnosis not present

## 2018-06-18 ENCOUNTER — Other Ambulatory Visit: Payer: Self-pay | Admitting: Cardiovascular Disease

## 2018-09-12 DIAGNOSIS — E1151 Type 2 diabetes mellitus with diabetic peripheral angiopathy without gangrene: Secondary | ICD-10-CM | POA: Diagnosis not present

## 2018-09-12 DIAGNOSIS — I10 Essential (primary) hypertension: Secondary | ICD-10-CM | POA: Diagnosis not present

## 2018-09-12 DIAGNOSIS — I480 Paroxysmal atrial fibrillation: Secondary | ICD-10-CM | POA: Diagnosis not present

## 2018-09-12 DIAGNOSIS — E785 Hyperlipidemia, unspecified: Secondary | ICD-10-CM | POA: Diagnosis not present

## 2018-09-12 DIAGNOSIS — K219 Gastro-esophageal reflux disease without esophagitis: Secondary | ICD-10-CM | POA: Diagnosis not present

## 2018-09-12 DIAGNOSIS — Z0000 Encounter for general adult medical examination without abnormal findings: Secondary | ICD-10-CM | POA: Diagnosis not present

## 2018-09-12 DIAGNOSIS — I2510 Atherosclerotic heart disease of native coronary artery without angina pectoris: Secondary | ICD-10-CM | POA: Diagnosis not present

## 2018-10-10 DIAGNOSIS — L570 Actinic keratosis: Secondary | ICD-10-CM | POA: Diagnosis not present

## 2018-10-10 DIAGNOSIS — D1800 Hemangioma unspecified site: Secondary | ICD-10-CM | POA: Diagnosis not present

## 2018-10-28 ENCOUNTER — Other Ambulatory Visit: Payer: Self-pay | Admitting: Cardiovascular Disease

## 2018-11-02 DIAGNOSIS — H353131 Nonexudative age-related macular degeneration, bilateral, early dry stage: Secondary | ICD-10-CM | POA: Diagnosis not present

## 2019-01-09 ENCOUNTER — Other Ambulatory Visit: Payer: Self-pay

## 2019-01-09 ENCOUNTER — Ambulatory Visit (INDEPENDENT_AMBULATORY_CARE_PROVIDER_SITE_OTHER): Payer: Medicare Other | Admitting: Cardiovascular Disease

## 2019-01-09 ENCOUNTER — Telehealth: Payer: Self-pay | Admitting: Cardiovascular Disease

## 2019-01-09 VITALS — BP 152/64 | HR 81 | Ht 71.0 in | Wt 216.0 lb

## 2019-01-09 DIAGNOSIS — I739 Peripheral vascular disease, unspecified: Secondary | ICD-10-CM | POA: Diagnosis not present

## 2019-01-09 DIAGNOSIS — Z951 Presence of aortocoronary bypass graft: Secondary | ICD-10-CM

## 2019-01-09 DIAGNOSIS — E782 Mixed hyperlipidemia: Secondary | ICD-10-CM | POA: Diagnosis not present

## 2019-01-09 DIAGNOSIS — I4891 Unspecified atrial fibrillation: Secondary | ICD-10-CM

## 2019-01-09 DIAGNOSIS — I25118 Atherosclerotic heart disease of native coronary artery with other forms of angina pectoris: Secondary | ICD-10-CM

## 2019-01-09 NOTE — Telephone Encounter (Signed)
Returned call to patient. He reports intermittent anterior (whole chest) pain that started around 2 weeks ago.   Does not radiate, no diaphoresis, no SOB, no fever cough or recent illness. Denies known injury and not exacerbated with movement or exertion.   Pt reports that it feels different than when he had heart attack.   Denies any chest pain today.   VS today 153/84, HR 60. Denies any fast heart rate or palpitations.    I made appt with Dr. Rockey Situ this afternoon as he had a cancellation.   Pt agrees.       COVID-19 Pre-Screening Questions:  . In the past 7 to 10 days have you had a cough,  shortness of breath, headache, congestion, fever (100 or greater) body aches, chills, sore throat, or sudden loss of taste or sense of smell? no . Have you been around anyone with known Covid 19. no . Have you been around anyone who is awaiting Covid 19 test results in the past 7 to 10 days? no . Have you been around anyone who has been exposed to Covid 19, or has mentioned symptoms of Covid 19 within the past 7 to 10 days? no  If you have any concerns/questions about symptoms patients report during screening (either on the phone or at [facility]). Contact the provider seeing the patient or DOD for further guidance.  If neither are available contact a member of the leadership team.

## 2019-01-09 NOTE — Telephone Encounter (Signed)
Pt c/o of Chest Pain: STAT if CP now or developed within 24 hours  1. Are you having CP right now? no  2. Are you experiencing any other symptoms (ex. SOB, nausea, vomiting, sweating)? Discomfort   3. How long have you been experiencing CP? A few days  4. Is your CP continuous or coming and going? Comes and goes  5. Have you taken Nitroglycerin? No  ?

## 2019-01-09 NOTE — Patient Instructions (Signed)

## 2019-01-09 NOTE — Progress Notes (Signed)
Cardiology Office Note  Date:  01/09/2019   ID:  Christopher, [name] [birth year], MRN [id number]  PCP:  Sofie Hartigan, MD   Chief Complaint  Patient presents with  . Other    Patient c/o intermittent anterior (whole chest) pain that started around 2 weeks ago. Meds reviewed verbally with patient.     HPI:  Christopher [name] is a very pleasant 85 year old gentleman with a history of coronary artery disease,  stent placed to his distal RCA in 2005, stent placed to his mid LAD, catheterization 09/17/2014 showing critical ostial LAD and proximal left circumflex disease, transferred to [facility] for bypass surgery,  Postoperatively had atrial fibrillation, started on anticoagulation and amiodarone who presents today for follow-up of his cad and  three-vessel CABG.  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 At the rehabilitation, he was receiving atorvastatin 80 mg daily with Vytorin (dose unclear). He became very ill, had chills, rigors, sweating, urine was very dark. He was noticed to be  jaundice and checked himself out of rehabilitation and went to the hospital, noted to have hepatitis. Symptoms resolved by holding the statin's. LFTs improved back to normal  Lipitor was restarted at low dose 20 mg   Prior episode of severe dizziness. He was driving at the time and had to pullover. Symptoms resolved without intervention. That night he developed upper respiratory infection with runny nose. Currently has severe cough and  unable to sleep. Additional mild episode of dizziness several days later. No further episodes since then  Cardiac catheter September 2005 shows 90% mid LAD, 80% distal RCA, 50% OM1 at the mid vessel, Taxus stent 2.75 x 24 mm stent to his LAD, TAxus stent 2.5 x 16 mm stent to the RCA.   stress test in July 2011 showed mild ischemia in the inferior wall consistent with previous stress test     PMH:   has a past medical history of Carotid artery stenosis, Coronary artery disease, GERD (gastroesophageal reflux disease), Hyperlipidemia, Hypertension, and PVD (peripheral vascular disease) ([place]).  PSH:    Past Surgical History:  Procedure Laterality Date  . CARDIAC CATHETERIZATION    . CHOLECYSTECTOMY  2016  . CORONARY ARTERY BYPASS GRAFT N/A 09/20/2014   Procedure: CORONARY ARTERY BYPASS GRAFTING (CABG) x   three using left internal mammary artery and right leg greater saphenous vein harvested endoscopically;  Surgeon: Melrose Nakayama, MD;  Location: [facility];  Service: Open Heart Surgery;  Laterality: N/A;  . TEE WITHOUT CARDIOVERSION N/A 09/20/2014   Procedure: TRANSESOPHAGEAL ECHOCARDIOGRAM (TEE);  Surgeon: Melrose Nakayama, MD;  Location[facility];  Service: Open Heart Surgery;  Laterality: N/A;  . TOTAL KNEE ARTHROPLASTY     bilateral    Current Outpatient Medications  Medication Sig Dispense Refill  . aspirin 81 MG EC tablet Take 81 mg by mouth daily.      Marland Kitchen atorvastatin (LIPITOR) 20 MG tablet Take 1 tablet (20 mg total) by mouth daily. 90 tablet 3  .  clopidogrel (PLAVIX) 75 MG tablet TAKE 1 TABLET BY MOUTH EVERY DAY 90 tablet 3  . esomeprazole (NEXIUM) 20 MG capsule Take 20 mg by mouth daily at 12 noon.    . metFORMIN (GLUCOPHAGE) 500 MG tablet Take 1 tablet (500 mg total) by mouth 2 (two) times daily with a meal.    . metoprolol tartrate (LOPRESSOR) 25 MG tablet TAKE 1/2 TABLET BY MOUTH TWICE A DAY 90 tablet 3   No current facility-administered medications for this visit.      Allergies:   Patient has no known allergies.   Social History:  The patient  reports that he has quit smoking. He has never used smokeless tobacco. He reports that he does not drink alcohol or use drugs.   Family History:   family history includes Heart failure in his mother.    Review of Systems: Review of Systems  Constitutional: Negative.   HENT: Negative.   Respiratory: Negative.   Cardiovascular: Positive for chest pain.  Gastrointestinal: Negative.   Musculoskeletal: Negative.   Neurological: Negative for seizures.  Psychiatric/Behavioral: Negative.   All other systems reviewed and are negative.   PHYSICAL EXAM: VS:  BP (!) 152/64 (BP Location: Left Arm, Patient Position: Sitting, Cuff Size: Normal)   Pulse 81   Ht 5\' 11"  (1.803 m)   Wt 216 lb (98 kg)   BMI 30.13 kg/m  , BMI Body mass index is 30.13 kg/m.  Constitutional:  oriented to person, place, and time. No distress.  HENT:  Head: Grossly normal Eyes:  no discharge. No scleral icterus.  Neck: No JVD, no carotid bruits  Cardiovascular: Regular rate and rhythm, no murmurs appreciated Pulmonary/Chest: Clear to auscultation bilaterally, no wheezes or rails Abdominal: Soft.  no distension.  no tenderness.  Musculoskeletal: Normal range of motion Neurological:  normal muscle tone. Coordination normal. No atrophy Skin: Skin warm and dry Psychiatric: normal affect, pleasant   Recent Labs: No results found for requested labs within last 8760 hours.    Lipid Panel Lab Results   Component Value Date   CHOL 121 09/17/2014   HDL 33 (L) 09/17/2014   LDLCALC 62 09/17/2014   TRIG 129 09/17/2014      Wt Readings from Last 3 Encounters:  01/09/19 216 lb (98 kg)  05/09/18 218 lb 8 oz (99.1 kg)  09/05/17 219 lb (99.3 kg)     ASSESSMENT AND PLAN:   Atrial fibrillation with RVR (HCC)  NSR, no arrhythmia Continue current medication  Coronary artery disease involving native coronary artery of native heart without angina pectoris -  Atypical discomfort Likely muscle, working heavy in the garden No symptoms on his morning 30-minute walk He will call us if symptoms change to more of his anginal equivalent  PVD (peripheral vascular disease) ([place]) Cholesterol at goal No claudication  No further testing at this time  S/P CABG x 3 Continue to walk on diabetes  Type 2 diabetes with complications We have encouraged continued exercise, careful diet management in an effort to lose weight.  Hyperlipidemia  continue his Lipitor 20 daily LDL at  goal    Total encounter time more than 25 minutes  Greater than 50% was spent in counseling and coordination of care with the patient   Disposition:   F/U  12 months    No orders of the defined types were placed in this encounter.    Signed, Esmond Plants, M.D., Ph.D. 01/09/2019  [place], [place]

## 2019-01-31 DIAGNOSIS — L570 Actinic keratosis: Secondary | ICD-10-CM | POA: Diagnosis not present

## 2019-01-31 DIAGNOSIS — L821 Other seborrheic keratosis: Secondary | ICD-10-CM | POA: Diagnosis not present

## 2019-03-14 DIAGNOSIS — I10 Essential (primary) hypertension: Secondary | ICD-10-CM | POA: Diagnosis not present

## 2019-03-14 DIAGNOSIS — K219 Gastro-esophageal reflux disease without esophagitis: Secondary | ICD-10-CM | POA: Diagnosis not present

## 2019-03-14 DIAGNOSIS — E785 Hyperlipidemia, unspecified: Secondary | ICD-10-CM | POA: Diagnosis not present

## 2019-03-14 DIAGNOSIS — E1151 Type 2 diabetes mellitus with diabetic peripheral angiopathy without gangrene: Secondary | ICD-10-CM | POA: Diagnosis not present

## 2019-03-14 DIAGNOSIS — I480 Paroxysmal atrial fibrillation: Secondary | ICD-10-CM | POA: Diagnosis not present

## 2019-03-14 DIAGNOSIS — I2510 Atherosclerotic heart disease of native coronary artery without angina pectoris: Secondary | ICD-10-CM | POA: Diagnosis not present

## 2019-03-26 ENCOUNTER — Other Ambulatory Visit: Payer: Self-pay | Admitting: Cardiovascular Disease

## 2019-10-22 ENCOUNTER — Other Ambulatory Visit: Payer: Self-pay | Admitting: Cardiovascular Disease

## 2019-11-11 ENCOUNTER — Other Ambulatory Visit: Payer: Self-pay | Admitting: Cardiovascular Disease

## 2019-11-11 NOTE — Progress Notes (Signed)
Cardiology Office Note  Date:  11/12/2019   ID:  Christopher [name], Christopher [name] 06-06-1931, MRN [id number]  PCP:  Sofie Hartigan, MD   Chief Complaint  Patient presents with  . other    Pt. c/o waking up during the night due to chest pain/discomfort with occas. shortness of breath. Meds reviewed by the pt. verbally.     HPI:  [name] is a very pleasant 86 year old gentleman with a history of coronary artery disease,  stent placed to his distal RCA in 2005, stent placed to his mid LAD, catheterization 09/17/2014 showing critical ostial LAD and proximal left circumflex disease, transferred to [facility] for bypass surgery,  Postoperatively had atrial fibrillation, started on anticoagulation and amiodarone who presents today for follow-up of his cad and  three-vessel CABG.  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, was retiring  Lab work reviewed in detail Total chol 119, LDL 56 Hemoglobin A1c 7.2  Having some chest pain at night, muscluloskeltal after lifting  Goes to the gym, active No angina Feet cramping, at night  has a new girlfriend, enjoys her company  Denies any tachycardia concerning for arrhythmia  EKG personally reviewed by myself on todays visit Shows normal sinus rhythm with rate 64 bpm PVCs in bigeminal pattern  Other past medical history mild carotid plaquing with mixed irregular plaque estimated at 40% on the right,  History of total knee replacement with rehabilitation at [facility]. At the rehabilitation, he was receiving atorvastatin 80 mg daily with Vytorin (dose unclear). He became very ill, had chills, rigors, sweating, urine was very dark. He was noticed to be jaundice and checked himself out of rehabilitation and went to the hospital, noted to have hepatitis. Symptoms resolved by holding the  statin's. LFTs improved back to normal  Lipitor was restarted at low dose 20 mg   Prior episode of severe dizziness. He was driving at the time and had to pullover. Symptoms resolved without intervention. That night he developed upper respiratory infection with runny nose. Currently has severe cough and  unable to sleep. Additional mild episode of dizziness several days later. No further episodes since then  Cardiac catheter September 2005 shows 90% mid LAD, 80% distal RCA, 50% OM1 at the mid vessel, Taxus stent 2.75 x 24 mm stent to his LAD, TAxus stent 2.5 x 16 mm stent to the RCA.   stress test in July 2011 showed mild ischemia in the inferior wall consistent with previous stress test    PMH:   has a past medical history of Carotid artery stenosis, Coronary artery disease, GERD (gastroesophageal reflux disease), Hyperlipidemia, Hypertension, and PVD (peripheral vascular disease) ([place]).  PSH:    Past Surgical History:  Procedure Laterality Date  . CARDIAC CATHETERIZATION    . CHOLECYSTECTOMY  2016  . CORONARY ARTERY BYPASS GRAFT N/A 09/20/2014   Procedure: CORONARY ARTERY BYPASS GRAFTING (CABG) x   three using left internal mammary artery and right leg greater saphenous vein harvested endoscopically;  Surgeon: Melrose Nakayama, MD;  Location: [facility];  Service: Open Heart Surgery;  Laterality: N/A;  . TEE WITHOUT CARDIOVERSION N/A 09/20/2014   Procedure: TRANSESOPHAGEAL ECHOCARDIOGRAM (TEE);  Surgeon: Melrose Nakayama, MD;  Location: [facility];  Service: Open Heart Surgery;  Laterality: N/A;  . TOTAL KNEE ARTHROPLASTY     bilateral  Current Outpatient Medications  Medication Sig Dispense Refill  . aspirin 81 MG EC tablet Take 81 mg by mouth daily.      Marland Kitchen atorvastatin (LIPITOR) 20 MG tablet Take 1 tablet (20 mg total) by mouth daily. 90 tablet 3  . clopidogrel (PLAVIX) 75 MG tablet TAKE 1 TABLET BY MOUTH EVERY DAY 90 tablet 0  . esomeprazole (NEXIUM) 20 MG capsule Take 20 mg by  mouth daily at 12 noon.    . metFORMIN (GLUCOPHAGE) 500 MG tablet Take 1 tablet (500 mg total) by mouth 2 (two) times daily with a meal.    . metoprolol tartrate (LOPRESSOR) 25 MG tablet TAKE 1/2 TABLET BY MOUTH TWICE A DAY 90 tablet 0   No current facility-administered medications for this visit.    Allergies:   Patient has no known allergies.   Social History:  The patient  reports that he has quit smoking. He has never used smokeless tobacco. He reports that he does not drink alcohol and does not use drugs.   Family History:   family history includes Heart failure in his mother.    Review of Systems: Review of Systems  Constitutional: Negative.   HENT: Negative.   Respiratory: Negative.   Cardiovascular: Positive for chest pain.  Gastrointestinal: Negative.   Musculoskeletal: Negative.   Neurological: Negative for seizures.  Psychiatric/Behavioral: Negative.   All other systems reviewed and are negative.   PHYSICAL EXAM: VS:  BP 140/80 (BP Location: Left Arm, Patient Position: Sitting, Cuff Size: Normal)   Pulse 75   Ht 5\' 11"  (1.803 m)   Wt 220 lb (99.8 kg)   SpO2 99%   BMI 30.68 kg/m  , BMI Body mass index is 30.68 kg/m.  Constitutional:  oriented to person, place, and time. No distress.  HENT:  Head: Grossly normal Eyes:  no discharge. No scleral icterus.  Neck: No JVD, no carotid bruits  Cardiovascular: Regular rate and rhythm, no murmurs appreciated Pulmonary/Chest: Clear to auscultation bilaterally, no wheezes or rails Abdominal: Soft.  no distension.  no tenderness.  Musculoskeletal: Normal range of motion Neurological:  normal muscle tone. Coordination normal. No atrophy Skin: Skin warm and dry Psychiatric: normal affect, pleasant  Recent Labs: No results found for requested labs within last 8760 hours.    Lipid Panel Lab Results  Component Value Date   CHOL 121 09/17/2014   HDL 33 (L) 09/17/2014   LDLCALC 62 09/17/2014   TRIG 129 09/17/2014     Wt Readings from Last 3 Encounters:  11/12/19 220 lb (99.8 kg)  01/09/19 216 lb (98 kg)  05/09/18 218 lb 8 oz (99.1 kg)     ASSESSMENT AND PLAN:   Atrial fibrillation with RVR (HCC)  NSR, no arrhythmia Continue current medication Frequent PVCs, no sx  Coronary artery disease involving native coronary artery of native heart without angina pectoris -  Atypical discomfort, discussed today Walking, no angina  PVD (peripheral vascular disease) (HCC) Cholesterol at goal Having cramping, in feet Trial hold of lipitor  S/P CABG x 3 Continue to walk on diabetes  Type 2 diabetes with complications We have encouraged continued exercise, careful diet management in an effort to lose weight. HBA1C 7.2  Hyperlipidemia continue his Lipitor 20 daily LDL at goal Foot cramping, trial hold of lipitor May need ABIs if no relief    Total encounter time more than 25 minutes  Greater than 50% was spent in counseling and coordination of care with the patient   Disposition:  F/U  12 months    No orders of the defined types were placed in this encounter.    Signed, Esmond Plants, M.D., Ph.D. 11/12/2019  [place], [place]

## 2019-11-12 ENCOUNTER — Ambulatory Visit (INDEPENDENT_AMBULATORY_CARE_PROVIDER_SITE_OTHER): Payer: Medicare Other | Admitting: Cardiovascular Disease

## 2019-11-12 ENCOUNTER — Encounter: Payer: Self-pay | Admitting: Cardiovascular Disease

## 2019-11-12 ENCOUNTER — Other Ambulatory Visit: Payer: Self-pay

## 2019-11-12 VITALS — BP 140/80 | HR 75 | Ht 71.0 in | Wt 220.0 lb

## 2019-11-12 DIAGNOSIS — E782 Mixed hyperlipidemia: Secondary | ICD-10-CM | POA: Diagnosis not present

## 2019-11-12 DIAGNOSIS — I25118 Atherosclerotic heart disease of native coronary artery with other forms of angina pectoris: Secondary | ICD-10-CM | POA: Diagnosis not present

## 2019-11-12 DIAGNOSIS — Z951 Presence of aortocoronary bypass graft: Secondary | ICD-10-CM | POA: Diagnosis not present

## 2019-11-12 DIAGNOSIS — I4891 Unspecified atrial fibrillation: Secondary | ICD-10-CM

## 2019-11-12 DIAGNOSIS — I739 Peripheral vascular disease, unspecified: Secondary | ICD-10-CM

## 2019-11-12 NOTE — Patient Instructions (Addendum)
Medication Instructions:  Hold the atorvastatin for a few weeks, See if cramping gets better If cramping gets better call the office  If no better on the cramping, restart atorvastatin  If you need a refill on your cardiac medications before your next appointment, please call your pharmacy.    Lab work: No new labs needed   If you have labs (blood work) drawn today and your tests are completely normal, you will receive your results only by:  [place] (if you have MyChart) OR  A paper copy in the mail If you have any lab test that is abnormal or we need to change your treatment, we will call you to review the results.   Testing/Procedures: No new testing needed   Follow-Up: At [facility], you and your health needs are our priority.  As part of our continuing mission to provide you with exceptional heart care, we have created designated Provider Care Teams.  These Care Teams include your primary Cardiologist (physician) and Advanced Practice Providers (APPs -  Physician Assistants and Nurse Practitioners) who all work together to provide you with the care you need, when you need it.   You will need a follow up appointment in 12 months   Providers on your designated Care Team:    Murray Hodgkins, NP  Christell Faith, PA-C  Marrianne Mood, PA-C  Any Other Special Instructions Will Be Listed Below (If Applicable).  For educational health videos Log in to : [email or web address] Or : [email or web address], password : [email or web address]

## 2019-11-18 ENCOUNTER — Telehealth: Payer: Self-pay | Admitting: Cardiology

## 2019-11-18 NOTE — Telephone Encounter (Signed)
Received OP call from patient reporting his HR has been low the last several days in the 40's however reports one instance where it dropped to the high 30's. He has symptoms of fatigue however no dizziness, presyncopal or syncopal episodes. Recommended that he hold metoprolol dose today and tomorrow morning. Will send message to primary cardiology team. ED precautions reviewed.     Kathyrn Drown NP-C [facility] Pager: [phone]

## 2019-11-18 NOTE — Telephone Encounter (Signed)
Would recommend he stay on the beta-blocker Low pulse rate is not accurate and secondary to PVCs as was seen on recent EKG in the office Heart rate when seen in the past week or so was 64 with PVCs bigeminal pattern On a monitor that we will read heart rate in the 30s which is inaccurate

## 2019-11-19 ENCOUNTER — Telehealth: Payer: Self-pay | Admitting: Cardiovascular Disease

## 2019-11-19 NOTE — Telephone Encounter (Signed)
I spoke with the patient's daughter, Christopher [name]. I advised her Dr. Rockey Situ had responded back to the on-call note from last night, but I did not see this until after getting off the phone with her initially.  I made her aware that per Dr. Rockey Situ, the patient needed to continue his beta blocker. However, I also advised I reviewed this further with Dr. Saunders Revel (DOD) to clarify what we needed to do with his beta blocker as he was only taking metoprolol tartrate 25 mg once daily.  Christopher [name] is aware, that per Dr. Saunders Revel, we could try having the patient take metoprolol tartrate 25 mg BID to see if this will help suppress his PVCs therefore raising his heart rate readings at home.   I have explained PVC's to [name] and the rationale for increasing the metoprolol tartrate to 25 mg BID. I have advised Christopher [name], if the patient could try cutting the metoprolol 25 mg in 1/2 - for 12.5 mg to see how his heart rate tolerates this, then that would be ok. However, if he cannot cut the pills in half, Christopher [name] is advised the patient may increase metoprolol tartrate to 25 mg BID. I have advised Christopher [name] that the patient has essentially been missing 12 hours of medication by taking the whole 25 mg at once, but if this can be taken twice daily, the hopefully we can see some improvement.   Christopher [name] voices understanding of the above and is agreeable. She is aware to call us back at anytime if the patient has any further problems.

## 2019-11-19 NOTE — Telephone Encounter (Signed)
I spoke with the patient's daughter, Olivia Mackie (ok per DPR). She states the patient started having some lightheadedness on Saturday. HR's on Saturday were in the 40's, but BP readings were ok.  Per Olivia Mackie, the patient would not let family call the office Saturday. They ate breakfast with him yesterday and he still felt "woozy," but went to church.  After church, his HR was 46 bpm & BP 150/60's. The patient had held his metoprolol yesterday and this morning. His chart states he was taking metoprolol tartrate 25 mg- 1/2 tablet once daily, but per Olivia Mackie, the patient was having trouble cutting this so he has been taking metoprolol tartrate 25 mg once daily.   I advised Olivia Mackie that per the patient's most recent office visit Dr. Rockey Situ had noted he was in bigeminy, so I feel like the patient may be having more PVC's and this is what is accounting for the patient's lower heart rates.  Will review with the DOD.  There is a separate note from Dr. Rockey Situ on 6/20 at 9:15 pm stating the patient should continue his metoprolol. I wonder if he would benefit from metoprolol succinate/ something different that he doesn't have to split in order to give him 24 hour coverage.   Olivia Mackie is aware I will call her back. Call back # is [phone].

## 2019-11-19 NOTE — Telephone Encounter (Signed)
Based on recent office visit note and EKG, I agree that low measured heart rates at home are likely due to frequent PVCs.  It might be worthwhile to increase metoprolol tartrate to 25 mg twice daily, as this could help suppress PVCs and actually increase his pulse rate.  It would also do away with needing to split pills.  If heart rate remains low (30s or 40s) or the patient continues to have symptoms, we should try to work him in to be seen in the next day or two.  Nelva Bush, MD [facility]

## 2019-11-19 NOTE — Telephone Encounter (Signed)
Patient daughter is calling, states patient has had more lightheadedness this weekend. STAT if HR is under 50 or over 120 (normal HR is 60-100 beats per minute)  1) What is your heart rate? 36  2) Do you have a log of your heart rate readings (document readings)? Yesterday was 40-43, at one time it was 46  3) Do you have any other symptoms? Lightheadedness, woozy.  States whomever was on call yesterday advised him to stop his BP medication.

## 2019-11-19 NOTE — Telephone Encounter (Signed)
I attempted to call Olivia Mackie back. No answer- I left a message to please call back.

## 2019-11-19 NOTE — Telephone Encounter (Signed)
See 11/19/19 telephone encounter for further details.

## 2019-11-23 ENCOUNTER — Telehealth: Payer: Self-pay | Admitting: Cardiovascular Disease

## 2019-11-23 NOTE — Telephone Encounter (Signed)
Spoke with the patients daughter Alphonzo Cruise sts that the patient Metoprolol was changed to 12.5 mg twice daily on 11/19/19. They were using the patients BP machine to check his BP/HR at home. The patients BP machine was registering HR's in the 40's. The patients HR the last couple of day has been in the 50's with BP's in the 150's/60's.  The last couple of days the patient has been having increase sob with exertion and occasional chest tightness. He denies swelling,CP, dizziness, pre-syncope or syncope. 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 When patient was seen by Dr. Rockey Situ on 11/12/19 his EKG showed frequent PVCs with bigeminal pattern.  In the interim she should have the patient stay hydrated and avoid caffeine. I will fwd the update to Dr. Rockey Situ for his recommendation on his return. If needed over the weekend they can call the on call provider for advice. The patient is to seek emergent care for worsening chest pressure or sob.  Olivia Mackie verbalized understanding and voiced appreciation for the call back.

## 2019-11-23 NOTE — Telephone Encounter (Signed)
Pt c/o Shortness Of Breath: STAT if SOB developed within the last 24 hours or pt is noticeably SOB on the phone  1. Are you currently SOB (can you hear that pt is SOB on the phone)? Unable to note daughter calling   2. How long have you been experiencing SOB? Last 48hrs   3. Are you SOB when sitting or when up moving around? Exertion  4. Are you currently experiencing any other symptoms? Heaviness in chest unable to get comfortable x2 days interim  HR somewhat improved sustaining 50's currently 52

## 2019-11-25 NOTE — Telephone Encounter (Signed)
Would probably recommend we talk to the patient I saw him in clinic 2 weeks ago was doing great Low heart rate likely secondary to PVCs that I talked to him about PVCs would not register on this cuff, heart rate inaccurately low when measured with a cuff in the setting of PVCs If he is having shortness of breath and chest tightness he did not let me know on his recent clinic visit and that may need to be worked up

## 2019-11-26 NOTE — Telephone Encounter (Signed)
Spoke with patients daughter per release form and she states that he has been feeling better. Reviewed how PVCs can alter heart rate on machines which is not accurate readings. She states that his breathing improved and felt much better. She states that they will monitor him and call back if he should have any return of symptoms or problems breathing. She verbalized understanding of our conversation, agreement with plan, and had no further questions at this time.

## 2020-01-13 ENCOUNTER — Other Ambulatory Visit: Payer: Self-pay | Admitting: Cardiovascular Disease

## 2020-01-30 ENCOUNTER — Encounter: Payer: Self-pay | Admitting: Podiatry

## 2020-01-30 ENCOUNTER — Ambulatory Visit (INDEPENDENT_AMBULATORY_CARE_PROVIDER_SITE_OTHER): Payer: Medicare Other

## 2020-01-30 ENCOUNTER — Other Ambulatory Visit: Payer: Self-pay

## 2020-01-30 ENCOUNTER — Ambulatory Visit (INDEPENDENT_AMBULATORY_CARE_PROVIDER_SITE_OTHER): Payer: Medicare Other | Admitting: Podiatry

## 2020-01-30 DIAGNOSIS — N400 Enlarged prostate without lower urinary tract symptoms: Secondary | ICD-10-CM | POA: Insufficient documentation

## 2020-01-30 DIAGNOSIS — I25118 Atherosclerotic heart disease of native coronary artery with other forms of angina pectoris: Secondary | ICD-10-CM | POA: Diagnosis not present

## 2020-01-30 DIAGNOSIS — E041 Nontoxic single thyroid nodule: Secondary | ICD-10-CM | POA: Insufficient documentation

## 2020-01-30 DIAGNOSIS — K7589 Other specified inflammatory liver diseases: Secondary | ICD-10-CM | POA: Insufficient documentation

## 2020-01-30 DIAGNOSIS — M778 Other enthesopathies, not elsewhere classified: Secondary | ICD-10-CM

## 2020-01-30 DIAGNOSIS — K8020 Calculus of gallbladder without cholecystitis without obstruction: Secondary | ICD-10-CM | POA: Insufficient documentation

## 2020-01-30 DIAGNOSIS — K219 Gastro-esophageal reflux disease without esophagitis: Secondary | ICD-10-CM | POA: Insufficient documentation

## 2020-01-30 NOTE — Progress Notes (Signed)
Subjective:  Patient ID: Christopher [name], male    DOB: January 19, 1932,  MRN: [id number] HPI Chief Complaint  Patient presents with  . Foot Pain    1st toe/MPJ/dorsal forefoot right - aching, episodes of redness x months, numbness medial side of hallux  . New Patient (Initial Visit)    Est pt 96    86 y.o. male presents with the above complaint.   ROS: Denies fever chills nausea vomiting muscle aches pains calf pain back pain chest pain shortness of breath.  Past Medical History:  Diagnosis Date  . Carotid artery stenosis    a. RICA 40%  . Coronary artery disease    a. PCI/DES to mLAD and dRCA in 2005, residual OM1 50%; b. nuclear stress test 02/2008: no ischemia; c. nuclear stress test 11/2009: mild ischemia in the inferior wall c/w previous stress test; d. cath 09/17/2014: critical ostial/prox LAD estimated at 90-95%, critical prox LCx, RCA w/ mild diff dz, EF >55%, no WMA  . GERD (gastroesophageal reflux disease)   . Hyperlipidemia   . Hypertension   . PVD (peripheral vascular disease) ([place])    Past Surgical History:  Procedure Laterality Date  . CARDIAC CATHETERIZATION    . CHOLECYSTECTOMY  2016  . CORONARY ARTERY BYPASS GRAFT N/A 09/20/2014   Procedure: CORONARY ARTERY BYPASS GRAFTING (CABG) x   three using left internal mammary artery and right leg greater saphenous vein harvested endoscopically;  Surgeon: Melrose Nakayama, MD;  Location: [facility];  Service: Open Heart Surgery;  Laterality: N/A;  . TEE WITHOUT CARDIOVERSION N/A 09/20/2014   Procedure: TRANSESOPHAGEAL ECHOCARDIOGRAM (TEE);  Surgeon: Melrose Nakayama, MD;  Location: [facility];  Service: Open Heart Surgery;  Laterality: N/A;  . TOTAL KNEE ARTHROPLASTY     bilateral    Current Outpatient Medications:  .  aspirin 81 MG EC tablet, Take 81 mg by mouth daily.  , Disp: , Rfl:  .  atorvastatin (LIPITOR) 20 MG tablet, Take 1 tablet (20 mg total) by mouth daily., Disp: 90 tablet, Rfl: 3 .  clopidogrel (PLAVIX) 75 MG  tablet, TAKE 1 TABLET BY MOUTH EVERY DAY, Disp: 90 tablet, Rfl: 0 .  esomeprazole (NEXIUM) 20 MG capsule, Take 20 mg by mouth daily at 12 noon., Disp: , Rfl:  .  lisinopril (ZESTRIL) 2.5 MG tablet, Take 2.5 mg by mouth daily., Disp: , Rfl:  .  metFORMIN (GLUCOPHAGE) 500 MG tablet, Take 1 tablet (500 mg total) by mouth 2 (two) times daily with a meal., Disp: , Rfl:  .  metoprolol tartrate (LOPRESSOR) 25 MG tablet, TAKE 1/2 TABLET BY MOUTH TWICE A DAY, Disp: 90 tablet, Rfl: 3  No Known Allergies Review of Systems Objective:  There were no vitals filed for this visit.  General: Well developed, nourished, in no acute distress, alert and oriented x3   Dermatological: Skin is warm, dry and supple bilateral. Nails x 10 are well maintained; remaining integument appears unremarkable at this time. There are no open sores, no preulcerative lesions, no rash or signs of infection present.  Vascular: Dorsalis Pedis artery and Posterior Tibial artery pedal pulses are 2/4 bilateral with immedate capillary fill time. Pedal hair growth present. No varicosities and no lower extremity edema present bilateral.   Neruologic: Grossly intact via light touch bilateral. Vibratory intact via tuning fork bilateral. Protective threshold with Semmes Wienstein monofilament intact to all pedal sites bilateral. Patellar and Achilles deep tendon reflexes 2+ bilateral. No Babinski or clonus noted bilateral.   Musculoskeletal: No  gross boney pedal deformities bilateral. No pain, crepitus, or limitation noted with foot and ankle range of motion bilateral. Muscular strength 5/5 in all groups tested bilateral.  He has pain on palpation and range of motion of the first metatarsophalangeal joint of the right foot with limited range of motion.  There is crepitation noticed as well.  Gait: Unassisted, Nonantalgic.    Radiographs:  Radiographs taken today demonstrate osteoarthritic changes of the first metatarsophalangeal joint of  the right foot.  Assessment & Plan:   Assessment: Hallux limitus first metatarsophalangeal joint right foot with capsulitis.  Plan: Discussed etiology pathology conservative surgical therapies explained to him that there is not much we can do about the numbness however I did inject Kenalog to the first metatarsophalangeal joint of the right foot.  He tolerated procedure well without complications I will follow-up with him on an as-needed basis.     [name]. [place], [place]

## 2020-03-16 ENCOUNTER — Other Ambulatory Visit: Payer: Self-pay | Admitting: Cardiovascular Disease

## 2020-06-11 ENCOUNTER — Ambulatory Visit (INDEPENDENT_AMBULATORY_CARE_PROVIDER_SITE_OTHER): Payer: Medicare Other | Admitting: Gastroenterology

## 2020-06-11 ENCOUNTER — Encounter: Payer: Self-pay | Admitting: Gastroenterology

## 2020-06-11 VITALS — BP 150/71 | HR 64 | Ht 71.0 in | Wt 213.0 lb

## 2020-06-11 DIAGNOSIS — R198 Other specified symptoms and signs involving the digestive system and abdomen: Secondary | ICD-10-CM

## 2020-06-11 NOTE — Progress Notes (Signed)
[name] MD, MRCP(U.K) [address]  [place]  [place], [place] [address]  Main: [phone]  Fax: [phone]   Gastroenterology Consultation  Referring Provider:     Sofie Hartigan, MD Primary Care Physician:  Sofie Hartigan, MD Primary Gastroenterologist:  Dr. [name]  Reason for Consultation:   Rectal fullness        HPI:   Christopher [name] is a 87 y.o. y/o male referred for consultation & management  by Dr. Ellison Hughs, Chrissie Noa, MD.   He states that he made an appointment to see Dr. Ellison Hughs a few months back when he had some rectal discomfort.  Subsequently he had a rectal exam and a PSA checked and since then he has had no issues or no discomfort in his rectal area.  He states that his last colonoscopy was a few years back and he was told he does not need 1 anymore.  Denies any rectal bleeding denies any constipation denies any change in shape of the stool or any perianal discomfort    Past Medical History:  Diagnosis Date  . Carotid artery stenosis    a. RICA 40%  . Coronary artery disease    a. PCI/DES to mLAD and dRCA in 2005, residual OM1 50%; b. nuclear stress test 02/2008: no ischemia; c. nuclear stress test 11/2009: mild ischemia in the inferior wall c/w previous stress test; d. cath 09/17/2014: critical ostial/prox LAD estimated at 90-95%, critical prox LCx, RCA w/ mild diff dz, EF >55%, no WMA  . GERD (gastroesophageal reflux disease)   . Hyperlipidemia   . Hypertension   . PVD (peripheral vascular disease) ([place])     Past Surgical History:  Procedure Laterality Date  . CARDIAC CATHETERIZATION    . CHOLECYSTECTOMY  2016  . CORONARY ARTERY BYPASS GRAFT N/A 09/20/2014   Procedure: CORONARY ARTERY BYPASS GRAFTING (CABG) x   three using left internal mammary artery and right leg greater saphenous vein harvested endoscopically;  Surgeon: Melrose Nakayama, MD;  Location: [facility];  Service: Open Heart Surgery;  Laterality: N/A;  . TEE WITHOUT  CARDIOVERSION N/A 09/20/2014   Procedure: TRANSESOPHAGEAL ECHOCARDIOGRAM (TEE);  Surgeon: Melrose Nakayama, MD;  Location: [facility];  Service: Open Heart Surgery;  Laterality: N/A;  . TOTAL KNEE ARTHROPLASTY     bilateral    Prior to Admission medications   Medication Sig Start Date End Date Taking? Authorizing Provider  aspirin 81 MG EC tablet Take 81 mg by mouth daily.      [provider]  atorvastatin (LIPITOR) 20 MG tablet Take 1 tablet (20 mg total) by mouth daily. 10/18/14   Minna Merritts, MD  clopidogrel (PLAVIX) 75 MG tablet TAKE 1 TABLET BY MOUTH EVERY DAY 03/17/20   Minna Merritts, MD  esomeprazole (NEXIUM) 20 MG capsule Take 20 mg by mouth daily at 12 noon.    [provider]  lisinopril (ZESTRIL) 2.5 MG tablet Take 2.5 mg by mouth daily. 12/20/19   [provider]  metFORMIN (GLUCOPHAGE) 500 MG tablet Take 1 tablet (500 mg total) by mouth 2 (two) times daily with a meal. 05/09/18   Gollan, Kathlene November, MD  metoprolol tartrate (LOPRESSOR) 25 MG tablet TAKE 1/2 TABLET BY MOUTH TWICE A DAY 01/14/20   Minna Merritts, MD    Family History  Problem Relation Age of Onset  . Heart failure Mother      Social History   Tobacco Use  . Smoking status:  Former Smoker  . Smokeless tobacco: Never Used  Vaping Use  . Vaping Use: Never used  Substance Use Topics  . Alcohol use: No  . Drug use: No    Allergies as of 06/11/2020  . (No Known Allergies)    Review of Systems:    All systems reviewed and negative except where noted in HPI.   Physical Exam:  There were no vitals taken for this visit. No LMP for male patient. Psych:  Alert and cooperative. Normal mood and affect. General:   Alert,  Well-developed, well-nourished, pleasant and cooperative in NAD Head:  Normocephalic and atraumatic. Eyes:  Sclera clear, no icterus.   Conjunctiva pink. Ears:  Normal auditory acuity. Neurologic:  Alert and oriented x3;  grossly normal  neurologically. Psych:  Alert and cooperative. Normal mood and affect.  Imaging Studies: No results found.  Assessment and Plan:   Christopher [name] is a 87 y.o. y/o male has been referred for rectal fullness.  He states that the symptoms lasted for a short period of time in October but has since completely resolved with no recurrence.  I discussed that if he has no active GI symptoms at this point of time I would not recommend any further evaluation.  Obviously if things were to recur we can proceed with endoscopy evaluation at that point of time.  He is aware that he can call our office at any point of time with any recurrence of symptoms and I would be very happy to see him right away.  Follow up in as needed  Dr [name] MD,MRCP(U.K)

## 2020-06-23 ENCOUNTER — Other Ambulatory Visit: Payer: Self-pay

## 2020-08-08 ENCOUNTER — Telehealth: Payer: Self-pay | Admitting: Cardiovascular Disease

## 2020-08-08 ENCOUNTER — Other Ambulatory Visit: Payer: Self-pay

## 2020-08-08 ENCOUNTER — Ambulatory Visit (INDEPENDENT_AMBULATORY_CARE_PROVIDER_SITE_OTHER): Payer: Medicare Other | Admitting: Cardiovascular Disease

## 2020-08-08 ENCOUNTER — Encounter: Payer: Self-pay | Admitting: Cardiovascular Disease

## 2020-08-08 ENCOUNTER — Ambulatory Visit (INDEPENDENT_AMBULATORY_CARE_PROVIDER_SITE_OTHER): Payer: Medicare Other

## 2020-08-08 VITALS — BP 162/78 | HR 56 | Ht 71.0 in | Wt 215.2 lb

## 2020-08-08 DIAGNOSIS — R55 Syncope and collapse: Secondary | ICD-10-CM

## 2020-08-08 DIAGNOSIS — I493 Ventricular premature depolarization: Secondary | ICD-10-CM

## 2020-08-08 DIAGNOSIS — R42 Dizziness and giddiness: Secondary | ICD-10-CM

## 2020-08-08 NOTE — Telephone Encounter (Signed)
Pt c/o BP issue: STAT if pt c/o blurred vision, one-sided weakness or slurred speech  1. What are your last 5 BP readings? 160/101, does not trust BP machine at home  2. Are you having any other symptoms (ex. Dizziness, headache, blurred vision, passed out)? Light headed and out balance   3. What is your BP issue?  Patient scheduled to come in and see Gollan today

## 2020-08-08 NOTE — Patient Instructions (Addendum)
Medication Instructions:  No changes   Lab work: No new labs needed  Testing/Procedures: Zio monitor  (near syncope/dizziness, frequent PVC) Heart monitor (Zio patch) for 2 weeks (14 days)   Your physician has recommended that you wear a Zio monitor. This monitor is a medical device that records the heart's electrical activity. Doctors most often use these monitors to diagnose arrhythmias. Arrhythmias are problems with the speed or rhythm of the heartbeat. The monitor is a small device applied to your chest. You can wear one while you do your normal daily activities. While wearing this monitor if you have any symptoms to push the button and record what you felt. Once you have worn this monitor for the period of time provider prescribed (Usually 14 days), you will return the monitor device in the postage paid box. Once it is returned they will download the data collected and provide [place] with a report which the provider will then review and we will call you with those results. Important tips:  1. Avoid showering during the first 24 hours of wearing the monitor. 2. Avoid excessive sweating to help maximize wear time. 3. Do not submerge the device, no hot tubs, and no swimming pools. 4. Keep any lotions or oils away from the patch. 5. After 24 hours you may shower with the patch on. Take brief showers with your back facing the shower head.  6. Do not remove patch once it has been placed because that will interrupt data and decrease adhesive wear time. 7. Push the button when you have any symptoms and write down what you were feeling. 8. Once you have completed wearing your monitor, remove and place into box which has postage paid and place in your outgoing mailbox.  9. If for some reason you have misplaced your box then call our office and we can provide another box and/or mail it off for you.        Follow-Up: At [facility], you and your health needs are our priority.  As part of our  continuing mission to provide you with exceptional heart care, we have created designated Provider Care Teams.  These Care Teams include your primary Cardiologist (physician) and Advanced Practice Providers (APPs -  Physician Assistants and Nurse Practitioners) who all work together to provide you with the care you need, when you need it.  . You will need a follow up appointment in 3 months  . Providers on your designated Care Team:   . Murray Hodgkins, NP . Christell Faith, PA-C . Marrianne Mood, PA-C

## 2020-08-08 NOTE — Progress Notes (Addendum)
Cardiology Office Note  Date:  08/08/2020   ID:  Jobanny, Mavis Nov 21, 1931, MRN [id number]  PCP:  Sofie Hartigan, MD   Chief Complaint  Patient presents with  . Dizziness    Patient c/o dizziness/lightheaded for the past couple of days. Medications reviewed by the patient verbally.     HPI:  Mr. Vorhees is a very pleasant 87 year old gentleman with a history of coronary artery disease,  stent placed to his distal RCA in 2005, stent placed to his mid LAD, catheterization 09/17/2014 showing critical ostial LAD and proximal left circumflex disease, transferred to [facility] for bypass surgery,  Postoperatively had atrial fibrillation, started on anticoagulation and amiodarone who presents today for follow-up of his cad and  three-vessel CABG.  LOV 10/2019 Called nursing, dizziness in the morning after getting up, taking shower, take morning meds, and then drinking coffee. This morning he checked his HR during the dizzy spells had readings of : HR 44, 49, 53 BP 160/101 Ate breakfast then felt a little better. Dizziness is usually only in the mornings, wondering if this is from his morning medications. No CP or shob. The dizziness will subside on it's own. Thinks his BP monitor is not accurate  AM dizziness recently, past 3-4 days episodes of dizziness in sitting position, Not with walking,gets better  Not on metoprolol Goes to gym, no sx  Does heavy work in the garden, manages a farm Was walking every morning with no angina  Lab work reviewed in detail Total chol 119, LDL 56 Hemoglobin A1c 7.2  Having some chest pain at night, muscluloskeltal after lifting  Recently married Orthostatics negative:  lowest pressure 134/68, pulse 67  EKG personally reviewed by myself on todays visit Shows normal sinus rhythm with rate 56 bpm PVCs in a trigeminal pattern  Other past medical history mild carotid plaquing with mixed irregular plaque estimated at 40% on the right,  History of  total knee replacement with rehabilitation at [facility]. At the rehabilitation, he was receiving atorvastatin 80 mg daily with Vytorin (dose unclear). He became very ill, had chills, rigors, sweating, urine was very dark. He was noticed to be jaundice and checked himself out of rehabilitation and went to the hospital, noted to have hepatitis. Symptoms resolved by holding the statin's. LFTs improved back to normal  Lipitor was restarted at low dose 20 mg   Prior episode of severe dizziness. He was driving at the time and had to pullover. Symptoms resolved without intervention. That night he developed upper respiratory infection with runny nose. Currently has severe cough and  unable to sleep. Additional mild episode of dizziness several days later. No further episodes since then  Cardiac catheter September 2005 shows 90% mid LAD, 80% distal RCA, 50% OM1 at the mid vessel, Taxus stent 2.75 x 24 mm stent to his LAD, TAxus stent 2.5 x 16 mm stent to the RCA.   stress test in July 2011 showed mild ischemia in the inferior wall consistent with previous stress test    PMH:   has a past medical history of Carotid artery stenosis, Coronary artery disease, GERD (gastroesophageal reflux disease), Hyperlipidemia, Hypertension, and PVD (peripheral vascular disease) ([place]).  PSH:    Past Surgical History:  Procedure Laterality Date  . CARDIAC CATHETERIZATION    . CHOLECYSTECTOMY  2016  . CORONARY ARTERY BYPASS GRAFT N/A 09/20/2014   Procedure: CORONARY ARTERY BYPASS GRAFTING (CABG) x   three using left internal mammary artery and right leg greater saphenous  vein harvested endoscopically;  Surgeon: Melrose Nakayama, MD;  Location: [facility];  Service: Open Heart Surgery;  Laterality: N/A;  . TEE WITHOUT CARDIOVERSION N/A 09/20/2014   Procedure: TRANSESOPHAGEAL ECHOCARDIOGRAM (TEE);  Surgeon: Melrose Nakayama, MD;  Location: [facility];  Service: Open Heart Surgery;  Laterality: N/A;  . TOTAL KNEE  ARTHROPLASTY     bilateral    Current Outpatient Medications  Medication Sig Dispense Refill  . aspirin 81 MG EC tablet Take 81 mg by mouth daily.    . cholecalciferol (VITAMIN D3) 25 MCG (1000 UNIT) tablet Take 1,000 Units by mouth daily.    . clopidogrel (PLAVIX) 75 MG tablet TAKE 1 TABLET BY MOUTH EVERY DAY 90 tablet 2  . lisinopril (ZESTRIL) 2.5 MG tablet Take 2.5 mg by mouth daily.    . Magnesium 250 MG TABS Take by mouth daily.    . metFORMIN (GLUCOPHAGE) 500 MG tablet Take 1 tablet (500 mg total) by mouth 2 (two) times daily with a meal.    . omeprazole (PRILOSEC) 20 MG capsule Take 1 tablet by mouth daily.    . rosuvastatin (CRESTOR) 5 MG tablet Take 5 mg by mouth daily.     No current facility-administered medications for this visit.    Allergies:   Patient has no known allergies.   Social History:  The patient  reports that he has quit smoking. He has never used smokeless tobacco. He reports that he does not drink alcohol and does not use drugs.   Family History:   family history includes Heart failure in his mother.    Review of Systems: Review of Systems  Constitutional: Negative.   HENT: Negative.   Respiratory: Negative.   Cardiovascular: Negative.   Gastrointestinal: Negative.   Musculoskeletal: Negative.   Neurological: Positive for dizziness.  Psychiatric/Behavioral: Negative.   All other systems reviewed and are negative.   PHYSICAL EXAM: VS:  BP (!) 162/78 (BP Location: Left Arm, Patient Position: Sitting, Cuff Size: Normal)   Pulse (!) 56   Ht 5\' 11"  (1.803 m)   Wt 215 lb 4 oz (97.6 kg)   SpO2 98%   BMI 30.02 kg/m  , BMI Body mass index is 30.02 kg/m.  Constitutional:  oriented to person, place, and time. No distress.  HENT:  Head: Grossly normal Eyes:  no discharge. No scleral icterus.  Neck: No JVD, no carotid bruits  Cardiovascular: Regular rate and rhythm, no murmurs appreciated Pulmonary/Chest: Clear to auscultation bilaterally, no wheezes  or rails Abdominal: Soft.  no distension.  no tenderness.  Musculoskeletal: Normal range of motion Neurological:  normal muscle tone. Coordination normal. No atrophy Skin: Skin warm and dry Psychiatric: normal affect, pleasant  Recent Labs: No results found for requested labs within last 8760 hours.    Lipid Panel Lab Results  Component Value Date   CHOL 121 09/17/2014   HDL 33 (L) 09/17/2014   LDLCALC 62 09/17/2014   TRIG 129 09/17/2014    Wt Readings from Last 3 Encounters:  08/08/20 215 lb 4 oz (97.6 kg)  06/11/20 213 lb (96.6 kg)  11/12/19 220 lb (99.8 kg)     ASSESSMENT AND PLAN:   Atrial fibrillation with RVR (HCC)  NSR, no arrhythmia Having dizziness/near syncope, zio ordered  Near syncope No orthostasis in office Bradycardia, frequent PVC Unable to exclude other arrhythmia, pauses Zio ordered  Coronary artery disease involving native coronary artery of native heart without angina pectoris -  Atypical discomfort, discussed today Walking, no angina  PVD (peripheral vascular disease) ([place]) Cholesterol at goal Continue Crestor  S/P CABG x 3 Currently with no symptoms of angina. No further workup at this time. Continue current medication regimen.  Type 2 diabetes with complications We have encouraged continued exercise, careful diet management in an effort to lose weight. HBA1C 7.2, down to 6.7 Married, eating better  Hyperlipidemia Cholesterol is at goal on the current lipid regimen. No changes to the medications were made.   Total encounter time more than 25 minutes  Greater than 50% was spent in counseling and coordination of care with the patient     No orders of the defined types were placed in this encounter.    Signed, Esmond Plants, M.D., Ph.D. 08/08/2020  [place], [place]

## 2020-08-08 NOTE — Telephone Encounter (Signed)
Was able to return Mr. Norrington phone call, he reports dizziness in the morning after getting up, taking shower, take morning meds, and then drinking coffee. This morning he checked his HR during the dizzy spells had readings of : HR 44, 49, 53 BP 160/101 Ate breakfast then felt a little better. Dizziness is usually only in the mornings, wondering if this is from his morning medications. No CP or shob. The dizziness will subside on it's own. Thinks his BP monitor is not accurate. Mr. Fritze does have an appt later today with Dr. Rockey Situ, advise him to bring in his home machine so we can check for comparisons with his BP readings. Mr. Ohalloran verbalized understanding. Advised if symptoms gets worse, CP, SHOB, weakness, and off balance then to seek possible ED for further evaluation if cannot make appt. Pt verbalized understanding. Otherwise all questions or concerns were address and no additional concerns at this time. Agreeable to plan, will call back for anything further.

## 2020-09-05 ENCOUNTER — Telehealth: Payer: Self-pay

## 2020-09-05 DIAGNOSIS — R55 Syncope and collapse: Secondary | ICD-10-CM

## 2020-09-05 DIAGNOSIS — I2510 Atherosclerotic heart disease of native coronary artery without angina pectoris: Secondary | ICD-10-CM

## 2020-09-05 DIAGNOSIS — I493 Ventricular premature depolarization: Secondary | ICD-10-CM

## 2020-09-05 NOTE — Telephone Encounter (Signed)
Able to reach pt regarding his recent Zio monitor, Dr. Rockey Situ had a chance to review his results and advised   "Zio monitor  High burden of PVCs  Given some symptoms of dizziness, palpitations, this may be difficult to treat in the setting of bradycardia  On last office visit heart rate in the mid 50s  Would recommend updated echocardiogram as it has been years. We need to determine his ejection fraction and rule out other structural heart disease  Reason for echo would be frequent PVCs, coronary disease, prior bypass surgery, near syncope  After echo complete, then would refer to EP. Unclear if he would be a candidate for antiarrhythmic medication"  Pt agreed to echo, went over what an echocardiogram entitled, and was schedule for pt. At this time all questions or concerns were address and no additional concerns at this time. Agreeable to plan, will call back for anything further.    Echo schedule for 5/4 at 07:30am

## 2020-09-13 ENCOUNTER — Other Ambulatory Visit: Payer: Self-pay

## 2020-09-13 ENCOUNTER — Encounter: Payer: Self-pay | Admitting: Emergency Medicine

## 2020-09-13 ENCOUNTER — Ambulatory Visit
Admission: EM | Admit: 2020-09-13 | Discharge: 2020-09-13 | Disposition: A | Discharge status: Home / Self Care | Payer: Medicare Other | Attending: Physician Assistant | Admitting: Physician Assistant

## 2020-09-13 DIAGNOSIS — M5412 Radiculopathy, cervical region: Secondary | ICD-10-CM

## 2020-09-13 DIAGNOSIS — M542 Cervicalgia: Secondary | ICD-10-CM

## 2020-09-13 DIAGNOSIS — I10 Essential (primary) hypertension: Secondary | ICD-10-CM

## 2020-09-13 DIAGNOSIS — R001 Bradycardia, unspecified: Secondary | ICD-10-CM

## 2020-09-13 DIAGNOSIS — I498 Other specified cardiac arrhythmias: Secondary | ICD-10-CM

## 2020-09-13 MED ORDER — BACLOFEN 10 MG PO TABS: 10.0000 mg | ORAL_TABLET | Freq: Three times a day (TID) | ORAL | 0 refills | Status: AC | PRN

## 2020-09-13 MED ORDER — HYDROCODONE-ACETAMINOPHEN 5-325 MG PO TABS: 2.0000 | ORAL_TABLET | Freq: Four times a day (QID) | ORAL | 0 refills | Status: AC | PRN

## 2020-09-13 NOTE — Discharge Instructions (Addendum)
NECK PAIN: Stressed avoiding painful activities. This can exacerbate your symptoms and make them worse.  May apply heat to the areas of pain for some relief. Use medications as directed. Be aware of which medications make you drowsy and do not drive or operate any kind of heavy machinery while using the medication (ie pain medications or muscle relaxers). F/U with PCP for reexamination or return sooner if condition worsens or does not begin to improve over the next few days.   NECK PAIN RED FLAGS: If symptoms get worse than they are right now, you should come back sooner for re-evaluation. If you have increased numbness/ tingling or notice that the numbness/tingling is affecting the legs or saddle region, go to ER. If you ever lose continence go to ER.      Follow up with orthopedics if still having symptoms in a week or if symptoms worsen. Can go back to [facility].

## 2020-09-13 NOTE — ED Triage Notes (Signed)
Patient in today c/o right sided shoulder pain and neck pain x 3 days. Patient denies any specific injury. Patient took Tylenol last night and this morning.

## 2020-09-13 NOTE — ED Provider Notes (Addendum)
[facility]    CSN: [id number] Arrival date & time: 09/13/20  1225      History   Chief Complaint Chief Complaint  Patient presents with  . Neck Pain  . Shoulder Pain    right    HPI Christopher [name] is a 87 y.o. male presenting for right-sided shoulder pain as well as bilateral neck pain x3 days.  Patient states that he has been working in his shop and moving a lot of things around for a trip to get together next weekend.  He also says that he was unloading a sprayer from his [place].  He says that it was heavier than he expected and is short of dragged him down when he was lifting it.  Denies any trauma.  He has not had numbness, tingling or weakness.  Patient says the pain is worse in the neck then in the shoulder.  He does admit to having a "small tear in the right shoulder before."  He did see orthopedics for this and was given an injection of a corticosteroid into the joint.  He says that that was done about a month ago and he has not had any problems with the shoulder since.  He does have good range of motion of the shoulder but says when he moves his neck it hurts a lot.  No pain into his chest or difficulty breathing.  Patient does have known history of bradycardia and hypertension and currently follows up with Ida Rogue, MD at [facility] in [place], [place]. Patient recently wore a cardiac monitor for 2 weeks which showed frequent PVCs and low heart rates. He is scheduled to have an ECHO next month.  Patient currently denying any dizziness, weakness or syncope.  He has no other complaints or concerns today.  HPI  Past Medical History:  Diagnosis Date  . Carotid artery stenosis    a. RICA 40%  . Coronary artery disease    a. PCI/DES to mLAD and dRCA in 2005, residual OM1 50%; b. nuclear stress test 02/2008: no ischemia; c. nuclear stress test 11/2009: mild ischemia in the inferior wall c/w previous stress test; d. cath 09/17/2014: critical ostial/prox LAD estimated at  90-95%, critical prox LCx, RCA w/ mild diff dz, EF >55%, no WMA  . GERD (gastroesophageal reflux disease)   . Hyperlipidemia   . Hypertension   . PVD (peripheral vascular disease) [facility])     Patient Active Problem List   Diagnosis Date Noted  . Benign prostatic hyperplasia 01/30/2020  . Cholelithiasis 01/30/2020  . Drug-induced cholestatic hepatitis 01/30/2020  . GERD (gastroesophageal reflux disease) 01/30/2020  . Thyroid nodule 01/30/2020  . S/P CABG x 3 09/20/2014  . Atrial fibrillation with RVR ([place]) 09/19/2014  . NSTEMI (non-ST elevated myocardial infarction) ([place]) 09/19/2014  . Borderline diabetes 06/12/2014  . HTN (hypertension), benign 02/28/2014  . Hepatotoxicity due to statin drug 11/02/2012  . PVD (peripheral vascular disease) ([place]) 05/19/2011  . Coronary atherosclerosis 12/18/2009  . Mixed hyperlipidemia 06/19/2009    Past Surgical History:  Procedure Laterality Date  . CARDIAC CATHETERIZATION    . CHOLECYSTECTOMY  2016  . CORONARY ARTERY BYPASS GRAFT N/A 09/20/2014   Procedure: CORONARY ARTERY BYPASS GRAFTING (CABG) x   three using left internal mammary artery and right leg greater saphenous vein harvested endoscopically;  Surgeon: Melrose Nakayama, MD;  Location: [place];  Service: Open Heart Surgery;  Laterality: N/A;  . TEE WITHOUT CARDIOVERSION N/A 09/20/2014   Procedure: TRANSESOPHAGEAL ECHOCARDIOGRAM (TEE);  Surgeon: Melrose Nakayama, MD;  Location: [place];  Service: Open Heart Surgery;  Laterality: N/A;  . TOTAL KNEE ARTHROPLASTY     bilateral       Home Medications    Prior to Admission medications   Medication Sig Start Date End Date Taking? Authorizing Provider  aspirin 81 MG EC tablet Take 81 mg by mouth daily.   Yes [provider]  baclofen (LIORESAL) 10 MG tablet Take 1 tablet (10 mg total) by mouth 3 (three) times daily as needed for up to 10 days for muscle spasms. 09/13/20 09/23/20 Yes Danton Clap, PA-C  cholecalciferol (VITAMIN  D3) 25 MCG (1000 UNIT) tablet Take 1,000 Units by mouth daily.   Yes [provider]  clopidogrel (PLAVIX) 75 MG tablet TAKE 1 TABLET BY MOUTH EVERY DAY 03/17/20  Yes Gollan, Kathlene November, MD  HYDROcodone-acetaminophen (NORCO/VICODIN) 5-325 MG tablet Take 2 tablets by mouth every 6 (six) hours as needed for up to 5 days. 09/13/20 09/18/20 Yes Laurene Footman B, PA-C  lisinopril (ZESTRIL) 2.5 MG tablet Take 2.5 mg by mouth daily. 12/20/19  Yes [provider]  Magnesium 250 MG TABS Take by mouth daily.   Yes [provider]  metFORMIN (GLUCOPHAGE) 500 MG tablet Take 1 tablet (500 mg total) by mouth 2 (two) times daily with a meal. 05/09/18  Yes Gollan, Kathlene November, MD  omeprazole (PRILOSEC) 20 MG capsule Take 1 tablet by mouth daily. 07/03/20  Yes [provider]  rosuvastatin (CRESTOR) 5 MG tablet Take 5 mg by mouth daily. 06/06/20  Yes [provider]    Family History Family History  Problem Relation Age of Onset  . Heart failure Mother     Social History Social History   Tobacco Use  . Smoking status: Former Smoker    Quit date: 09/14/1955    Years since quitting: 65.0  . Smokeless tobacco: Never Used  Vaping Use  . Vaping Use: Never used  Substance Use Topics  . Alcohol use: No  . Drug use: No     Allergies   Patient has no known allergies.   Review of Systems Review of Systems  Constitutional: Negative for fatigue.  Musculoskeletal: Positive for arthralgias, back pain, neck pain and neck stiffness. Negative for gait problem and joint swelling.  Skin: Negative for rash.  Neurological: Negative for dizziness, syncope, weakness and numbness.     Physical Exam Triage Vital Signs ED Triage Vitals  Enc Vitals Group     BP 09/13/20 1234 (!) 179/53     Pulse Rate 09/13/20 1234 (!) 40     Resp 09/13/20 1234 18     Temp 09/13/20 1234 98 F (36.7 C)     Temp Source 09/13/20 1234 Oral     SpO2 09/13/20 1234 100 %     Weight 09/13/20 1233  211 lb (95.7 kg)     Height 09/13/20 1233 5\' 11"  (1.803 m)     Head Circumference --      Peak Flow --      Pain Score 09/13/20 1233 10     Pain Loc --      Pain Edu? --      Excl. in [place]? --    No data found.  Updated Vital Signs BP (!) 179/53 (BP Location: Left Arm)   Pulse (!) 40   Temp 98 F (36.7 C) (Oral)   Resp 18   Ht 5\' 11"  (1.803 m)   Wt 211 lb (  95.7 kg)   SpO2 100%   BMI 29.43 kg/m       Physical Exam Vitals and nursing note reviewed.  Constitutional:      General: He is not in acute distress.    Appearance: Normal appearance. He is well-developed. He is not ill-appearing, toxic-appearing or diaphoretic.  HENT:     Head: Normocephalic and atraumatic.  Eyes:     General: No scleral icterus.    Conjunctiva/sclera: Conjunctivae normal.  Cardiovascular:     Rate and Rhythm: Normal rate. Rhythm irregular.     Pulses: Normal pulses.     Heart sounds: Normal heart sounds.  Pulmonary:     Effort: Pulmonary effort is normal. No respiratory distress.     Breath sounds: Normal breath sounds. No wheezing, rhonchi or rales.  Musculoskeletal:     Right shoulder: No swelling, tenderness or bony tenderness. Decreased range of motion.     Cervical back: Neck supple. Tenderness (diffuse TTP C4-C7 and bilateral paraspinal muscles (R>L)) present. No swelling. Pain with movement present. Decreased range of motion.     Thoracic back: Normal.  Skin:    General: Skin is warm and dry.  Neurological:     General: No focal deficit present.     Mental Status: He is alert. Mental status is at baseline.     Motor: No weakness.     Gait: Gait normal.  Psychiatric:        Mood and Affect: Mood normal.        Behavior: Behavior normal.        Thought Content: Thought content normal.      UC Treatments / Results  Labs (all labs ordered are listed, but only abnormal results are displayed) Labs Reviewed - No data to display  EKG   Radiology No results  found.  Procedures ED EKG  Date/Time: 09/13/2020 12:52 PM Performed by: Danton Clap, PA-C Authorized by: Danton Clap, PA-C   ECG reviewed by ED Physician in the absence of a cardiologist: yes   Previous ECG:    Previous ECG:  Compared to current   Similarity:  No change   Comparison ECG info:  No changes from 10/2019 Interpretation:    Interpretation: abnormal   Rate:    ECG rate:  80   ECG rate assessment: normal   Rhythm:    Rhythm: sinus rhythm   Ectopy:    Ectopy: bigeminy   QRS:    QRS axis:  Normal   QRS intervals:  Normal   QRS conduction: normal   ST segments:    ST segments:  Normal T waves:    T waves: normal   Comments:     Sinus rhythm with PVCs in bigeminy pattern   (including critical care time)  Medications Ordered in UC Medications - No data to display  Initial Impression / Assessment and Plan / UC Course  I have reviewed the triage vital signs and the nursing notes.  Pertinent labs & imaging results that were available during my care of the patient were reviewed by me and considered in my medical decision making (see chart for details).   87 year old male presenting for neck and shoulder pain x3 days.  Clinical presentation consistent with cervical radiculopathy.  Advised supportive care at this time.  Treating patient with baclofen and Norco for pain relief.  Controlled substance database reviewed and patient low risk for abuse.  I did review his orthopedic note regarding the bursitis of his right  shoulder from August 2021.  His symptoms today do seem different.  He does have full range of motion and no tenderness to any part of the shoulder.  Symptoms more consistent with cervical radiculopathy.  I did advise him to follow back up with orthopedics if his symptoms persist over the next week or if symptoms worsen.  I did review ED red flag signs and symptoms regarding neck pain with patient.  Blood pressure elevated at 179/53 in clinic and pulse  rate shows 40 bpm.  EKG performed today shows ventricular rate of 80 and PVCs in a pattern of bigeminy.  I did review multiple cardiology notes.  Patient has a cardiologist he follows up with regularly and has had a history of bradycardia and frequent PVCs.  He is scheduled to have an echo next month.  He is currently denying any symptoms such as dizziness or feeling faint.  No chest pain or breathing problem.  Advised him that he should continue to monitor his situation and continue at home medications and to follow-up with cardiology.   Final Clinical Impressions(s) / UC Diagnoses   Final diagnoses:  Neck pain  Cervical radiculopathy  Bradycardia  Bigeminal rhythm  Essential hypertension     Discharge Instructions     NECK PAIN: Stressed avoiding painful activities. This can exacerbate your symptoms and make them worse.  May apply heat to the areas of pain for some relief. Use medications as directed. Be aware of which medications make you drowsy and do not drive or operate any kind of heavy machinery while using the medication (ie pain medications or muscle relaxers). F/U with PCP for reexamination or return sooner if condition worsens or does not begin to improve over the next few days.   NECK PAIN RED FLAGS: If symptoms get worse than they are right now, you should come back sooner for re-evaluation. If you have increased numbness/ tingling or notice that the numbness/tingling is affecting the legs or saddle region, go to ER. If you ever lose continence go to ER.      Follow up with orthopedics if still having symptoms in a week or if symptoms worsen. Can go back to [facility].    ED Prescriptions    Medication Sig Dispense Auth. Provider   HYDROcodone-acetaminophen (NORCO/VICODIN) 5-325 MG tablet Take 2 tablets by mouth every 6 (six) hours as needed for up to 5 days. 12 tablet Laurene Footman B, PA-C   baclofen (LIORESAL) 10 MG tablet Take 1 tablet (10 mg total) by mouth 3 (three)  times daily as needed for up to 10 days for muscle spasms. 30 each Danton Clap, PA-C     I have reviewed the PDMP during this encounter.   Danton Clap, PA-C 09/13/20 1315    Laurene Footman B, PA-C 09/13/20 1316

## 2020-09-26 ENCOUNTER — Telehealth: Payer: Self-pay | Admitting: Cardiovascular Disease

## 2020-09-26 NOTE — Telephone Encounter (Signed)
Patient calling to cancel Echo . He feels better with recent med change and states at this time he doesn't feel like  Echo is needed and will fu with Gollan in June to discuss further .

## 2020-10-01 ENCOUNTER — Other Ambulatory Visit: Payer: Medicare Other

## 2020-11-12 ENCOUNTER — Other Ambulatory Visit: Payer: Self-pay

## 2020-11-12 ENCOUNTER — Encounter: Payer: Self-pay | Admitting: Cardiovascular Disease

## 2020-11-12 ENCOUNTER — Ambulatory Visit (INDEPENDENT_AMBULATORY_CARE_PROVIDER_SITE_OTHER): Payer: Medicare Other | Admitting: Cardiovascular Disease

## 2020-11-12 VITALS — BP 130/64 | HR 52 | Ht 71.0 in | Wt 208.4 lb

## 2020-11-12 DIAGNOSIS — I739 Peripheral vascular disease, unspecified: Secondary | ICD-10-CM | POA: Diagnosis not present

## 2020-11-12 DIAGNOSIS — I10 Essential (primary) hypertension: Secondary | ICD-10-CM

## 2020-11-12 DIAGNOSIS — I2510 Atherosclerotic heart disease of native coronary artery without angina pectoris: Secondary | ICD-10-CM

## 2020-11-12 DIAGNOSIS — I4891 Unspecified atrial fibrillation: Secondary | ICD-10-CM | POA: Diagnosis not present

## 2020-11-12 NOTE — Progress Notes (Signed)
Cardiology Office Note  Date:  11/12/2020   ID:  Christopher [name], Christopher [name] Sep 07, 1931, MRN [id number]  PCP:  Sofie Hartigan, MD   Chief Complaint  Patient presents with   3 month follow up     "Doing well." Medications reviewed by the patient verbally.     HPI:  Christopher [name] is a very pleasant 87 year old gentleman with a history of coronary artery disease,  stent placed to his distal RCA in 2005, stent placed to his mid LAD, catheterization 09/17/2014 showing critical ostial LAD and proximal left circumflex disease, transferred to [facility] for bypass surgery,  Postoperatively had atrial fibrillation, started on anticoagulation and amiodarone who presents today for follow-up of his CAD and  three-vessel CABG.  On his last clinic visit with bradycardia Monitor reviewed, had a high burden PVCs 20% burden  We had ordered echocardiogram, he did not have this completed He had canceled the echo as he felt better after medication changes Tolerating metoprolol tartrate 12.5 twice daily  EKG personally reviewed by myself on todays visit Sinus bradycardia rate 52 bpm  Getting over neck pain Was lifting something heavy when he developed pain  Currently feels well no complaints, walking every morning    Other past medical history mild carotid plaquing with mixed irregular plaque estimated at 40% on the right,   History of total knee replacement with rehabilitation at [facility]. At the rehabilitation, he was receiving atorvastatin 80 mg daily with Vytorin (dose unclear). He became very ill, had chills, rigors, sweating, urine was very dark. He was noticed to be jaundice and checked himself out of rehabilitation and went to the hospital, noted to have hepatitis. Symptoms resolved by holding the statin's. LFTs improved back to normal  Lipitor was restarted at low dose 20 mg    Prior episode of severe dizziness. He was driving at the time and had to pullover. Symptoms resolved without  intervention. That night he developed upper respiratory infection with runny nose. Currently has severe cough and  unable to sleep. Additional mild episode of dizziness several days later. No further episodes since then   Cardiac catheter September 2005 shows 90% mid LAD, 80% distal RCA, 50% OM1 at the mid vessel, Taxus stent 2.75 x 24 mm stent to his LAD, TAxus stent 2.5 x 16 mm stent to the RCA.   stress test in July 2011 showed mild ischemia in the inferior wall consistent with previous stress test    PMH:   has a past medical history of Carotid artery stenosis, Coronary artery disease, GERD (gastroesophageal reflux disease), Hyperlipidemia, Hypertension, and PVD (peripheral vascular disease) ([place]).  PSH:    Past Surgical History:  Procedure Laterality Date   CARDIAC CATHETERIZATION     CHOLECYSTECTOMY  2016   CORONARY ARTERY BYPASS GRAFT N/A 09/20/2014   Procedure: CORONARY ARTERY BYPASS GRAFTING (CABG) x   three using left internal mammary artery and right leg greater saphenous vein harvested endoscopically;  Surgeon: Melrose Nakayama, MD;  Location: [place];  Service: Open Heart Surgery;  Laterality: N/A;   TEE WITHOUT CARDIOVERSION N/A 09/20/2014   Procedure: TRANSESOPHAGEAL ECHOCARDIOGRAM (TEE);  Surgeon: Melrose Nakayama, MD;  Location: [place];  Service: Open Heart Surgery;  Laterality: N/A;   TOTAL KNEE ARTHROPLASTY     bilateral    Current Outpatient Medications  Medication Sig Dispense Refill   aspirin 81 MG EC tablet Take 81 mg by mouth daily.     celecoxib (CELEBREX) 200 MG capsule Take 200  mg by mouth daily.     cholecalciferol (VITAMIN D3) 25 MCG (1000 UNIT) tablet Take 1,000 Units by mouth daily.     clopidogrel (PLAVIX) 75 MG tablet TAKE 1 TABLET BY MOUTH EVERY DAY 90 tablet 2   lisinopril (ZESTRIL) 20 MG tablet Take 20 mg by mouth daily.     Magnesium 250 MG TABS Take by mouth daily.     metFORMIN (GLUCOPHAGE) 500 MG tablet Take 1 tablet (500 mg total) by mouth 2  (two) times daily with a meal.     metoprolol tartrate (LOPRESSOR) 25 MG tablet Take 12.5 mg by mouth 2 (two) times daily.     omeprazole (PRILOSEC) 20 MG capsule Take 1 tablet by mouth daily.     rosuvastatin (CRESTOR) 5 MG tablet Take 5 mg by mouth daily.     No current facility-administered medications for this visit.    Allergies:   Patient has no known allergies.   Social History:  The patient  reports that he quit smoking about 65 years ago. His smoking use included cigarettes. He has never used smokeless tobacco. He reports that he does not drink alcohol and does not use drugs.   Family History:   family history includes Heart failure in his mother.    Review of Systems: Review of Systems  Constitutional: Negative.   HENT: Negative.    Respiratory: Negative.    Cardiovascular: Negative.   Gastrointestinal: Negative.   Musculoskeletal: Negative.   Neurological:  Positive for dizziness.  Psychiatric/Behavioral: Negative.    All other systems reviewed and are negative.  PHYSICAL EXAM: VS:  BP 130/64 (BP Location: Left Arm, Patient Position: Sitting, Cuff Size: Normal)   Pulse (!) 52   Ht 5\' 11"  (1.803 m)   Wt 208 lb 6 oz (94.5 kg)   SpO2 98%   BMI 29.06 kg/m  , BMI Body mass index is 29.06 kg/m.  Constitutional:  oriented to person, place, and time. No distress.  HENT:  Head: Grossly normal Eyes:  no discharge. No scleral icterus.  Neck: No JVD, no carotid bruits  Cardiovascular: Regular rate and rhythm, no murmurs appreciated Pulmonary/Chest: Clear to auscultation bilaterally, no wheezes or rails Abdominal: Soft.  no distension.  no tenderness.  Musculoskeletal: Normal range of motion Neurological:  normal muscle tone. Coordination normal. No atrophy Skin: Skin warm and dry Psychiatric: normal affect, pleasant  Recent Labs: No results found for requested labs within last 8760 hours.    Lipid Panel Lab Results  Component Value Date   CHOL 121 09/17/2014    HDL 33 (L) 09/17/2014   LDLCALC 62 09/17/2014   TRIG 129 09/17/2014    Wt Readings from Last 3 Encounters:  11/12/20 208 lb 6 oz (94.5 kg)  09/13/20 211 lb (95.7 kg)  08/08/20 215 lb 4 oz (97.6 kg)     ASSESSMENT AND PLAN:   Atrial fibrillation with RVR (HCC)  NSR, no arrhythmia stable  Near syncope Rare sx, consider lisinopril 10 BID  Coronary artery disease involving native coronary artery of native heart without angina pectoris -  Currently with no symptoms of angina. No further workup at this time. Continue current medication regimen.  PVD (peripheral vascular disease) ([place]) Cholesterol is at goal on the current lipid regimen. No changes to the medications were made. No claudication  S/P CABG x 3 Currently with no symptoms of angina. No further workup at this time. Continue current medication regimen.  Type 2 diabetes with complications We have encouraged continued  exercise, careful diet management in an effort to lose weight. HBA1C 7.3  Hyperlipidemia At goal   Total encounter time more than 25 minutes  Greater than 50% was spent in counseling and coordination of care with the patient     No orders of the defined types were placed in this encounter.    Signed, Esmond Plants, M.D., Ph.D. 11/12/2020  [place], [place]

## 2020-11-12 NOTE — Patient Instructions (Signed)
Medication Instructions:  No changes  If you need a refill on your cardiac medications before your next appointment, please call your pharmacy.    Lab work: No new labs needed   If you have labs (blood work) drawn today and your tests are completely normal, you will receive your results only by: . MyChart Message (if you have MyChart) OR . A paper copy in the mail If you have any lab test that is abnormal or we need to change your treatment, we will call you to review the results.   Testing/Procedures: No new testing needed   Follow-Up: At [facility], you and your health needs are our priority.  As part of our continuing mission to provide you with exceptional heart care, we have created designated Provider Care Teams.  These Care Teams include your primary Cardiologist (physician) and Advanced Practice Providers (APPs -  Physician Assistants and Nurse Practitioners) who all work together to provide you with the care you need, when you need it.  . You will need a follow up appointment in 12 months  . Providers on your designated Care Team:   . Christopher Berge, NP . Ryan Dunn, PA-C . Jacquelyn Visser, PA-C  Any Other Special Instructions Will Be Listed Below (If Applicable).  COVID-19 Vaccine Information can be found at: [email or web address] For questions related to vaccine distribution or appointments, please email [email or web address] or call [phone].     

## 2020-12-09 ENCOUNTER — Other Ambulatory Visit: Payer: Self-pay | Admitting: Cardiovascular Disease

## 2021-02-13 ENCOUNTER — Emergency Department: Payer: Medicare Other

## 2021-02-13 ENCOUNTER — Emergency Department
Admission: EM | Admit: 2021-02-13 | Discharge: 2021-02-13 | Disposition: A | Discharge status: Home / Self Care | Payer: Medicare Other | Attending: Emergency Medicine | Admitting: Emergency Medicine

## 2021-02-13 ENCOUNTER — Other Ambulatory Visit: Payer: Self-pay

## 2021-02-13 ENCOUNTER — Telehealth: Payer: Self-pay | Admitting: Cardiovascular Disease

## 2021-02-13 ENCOUNTER — Encounter: Payer: Self-pay | Admitting: Emergency Medicine

## 2021-02-13 DIAGNOSIS — R519 Headache, unspecified: Secondary | ICD-10-CM | POA: Diagnosis not present

## 2021-02-13 DIAGNOSIS — R001 Bradycardia, unspecified: Secondary | ICD-10-CM | POA: Diagnosis not present

## 2021-02-13 DIAGNOSIS — Z79899 Other long term (current) drug therapy: Secondary | ICD-10-CM | POA: Insufficient documentation

## 2021-02-13 DIAGNOSIS — I4891 Unspecified atrial fibrillation: Secondary | ICD-10-CM | POA: Diagnosis not present

## 2021-02-13 DIAGNOSIS — Z7901 Long term (current) use of anticoagulants: Secondary | ICD-10-CM | POA: Diagnosis not present

## 2021-02-13 DIAGNOSIS — I2510 Atherosclerotic heart disease of native coronary artery without angina pectoris: Secondary | ICD-10-CM | POA: Diagnosis not present

## 2021-02-13 DIAGNOSIS — Z7984 Long term (current) use of oral hypoglycemic drugs: Secondary | ICD-10-CM | POA: Diagnosis not present

## 2021-02-13 DIAGNOSIS — Z96653 Presence of artificial knee joint, bilateral: Secondary | ICD-10-CM | POA: Insufficient documentation

## 2021-02-13 DIAGNOSIS — Z87891 Personal history of nicotine dependence: Secondary | ICD-10-CM | POA: Diagnosis not present

## 2021-02-13 DIAGNOSIS — Z951 Presence of aortocoronary bypass graft: Secondary | ICD-10-CM | POA: Insufficient documentation

## 2021-02-13 DIAGNOSIS — Z7982 Long term (current) use of aspirin: Secondary | ICD-10-CM | POA: Insufficient documentation

## 2021-02-13 DIAGNOSIS — R42 Dizziness and giddiness: Secondary | ICD-10-CM | POA: Diagnosis not present

## 2021-02-13 DIAGNOSIS — I119 Hypertensive heart disease without heart failure: Secondary | ICD-10-CM | POA: Diagnosis not present

## 2021-02-13 LAB — BASIC METABOLIC PANEL
Anion gap: 6 (ref 5–15)
BUN: 24 mg/dL — ABNORMAL HIGH (ref 8–23)
CO2: 29 mmol/L (ref 22–32)
Calcium: 9 mg/dL (ref 8.9–10.3)
Chloride: 101 mmol/L (ref 98–111)
Creatinine, Ser: 1.23 mg/dL (ref 0.61–1.24)
GFR, Estimated: 57 mL/min — ABNORMAL LOW (ref >60)
Glucose, Bld: 263 mg/dL — ABNORMAL HIGH (ref 70–99)
Potassium: 4.6 mmol/L (ref 3.5–5.1)
Sodium: 136 mmol/L (ref 135–145)

## 2021-02-13 LAB — CBC
HCT: 43.5 % (ref 39.0–52.0)
Hemoglobin: 15.7 g/dL (ref 13.0–17.0)
MCH: 32.8 pg (ref 26.0–34.0)
MCHC: 36.1 g/dL — ABNORMAL HIGH (ref 30.0–36.0)
MCV: 91 fL (ref 80.0–100.0)
Platelets: 132 10*3/uL — ABNORMAL LOW (ref 150–400)
RBC: 4.78 MIL/uL (ref 4.22–5.81)
RDW: 13.4 % (ref 11.5–15.5)
WBC: 8.1 10*3/uL (ref 4.0–10.5)
nRBC: 0 % (ref 0.0–0.2)

## 2021-02-13 LAB — TROPONIN I (HIGH SENSITIVITY)
Troponin I (High Sensitivity): 6 ng/L (ref <18)
Troponin I (High Sensitivity): 6 ng/L (ref <18)

## 2021-02-13 MED ORDER — ONDANSETRON HCL 4 MG/2ML IJ SOLN: 4.0000 mg | Freq: Once | INTRAMUSCULAR | Status: DC

## 2021-02-13 MED ORDER — MORPHINE SULFATE (PF) 4 MG/ML IV SOLN: 4.0000 mg | Freq: Once | INTRAVENOUS | Status: DC

## 2021-02-13 NOTE — Discharge Instructions (Addendum)
Call Dr. Rockey Situ to arrange a follow-up appointment next week.  Return to the ER for new, worsening, or persistent severe weakness or dizziness, feeling like you are going to pass out, chest pain, difficulty breathing, or persistent low heart rate.

## 2021-02-13 NOTE — ED Triage Notes (Signed)
C/O feeling dizzy today when getting up.  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  Ambulates into triage. AAOx3.  Skin warm and dry. NAD.  States has history of irregular heartbeat.

## 2021-02-13 NOTE — Telephone Encounter (Signed)
STAT if HR is under 50 or over 120 (normal HR is 60-100 beats per minute)  What is your heart rate? 38 double checked at fire Department  Do you have a log of your heart rate readings (document readings)? Normal 60's current 38 BP 156/77   Do you have any other symptoms? Woozy in head

## 2021-02-13 NOTE — Telephone Encounter (Signed)
Was able to take incoming call from triage, Christopher [name] reports he is feeling "woozy headed, dizziness, and lightheadedness"  Early this am BP 156/77, but since has dropped. Stated his HR is in the 30s and BP 77/66 at current.  He went to the FD "where they confirmed my HR was 38 and my BP was low".  Asked pt if he was having any other symptoms, he stated "my head isn't right, its very woozy, but I do not have any CP, I am wobbly". Pt asked for an appointment to come in to have vitals recheck and for EKG, advised no open appts today, and concerning for low HR and BP. Pt able to recheck vitals while on the phone. Advised could be dehydration.   HR 36, BP 80/64. Advised pt to the ED, pt asked about Urgent Care, advised they will more then likely tell him to go to the ED d/t low vitals and symptoms of "woozy" and being "wobbly" as it is a concern and he probably will need additional testing that Urgent Care is not equipped to provide.  Christopher [name] verbalized understanding, reports will drive himself, advised against that for safety concerns. Pt will call a friend or EMS to transport him to [facility] per pt, he is thankful for taking his call and addressing his concerns.

## 2021-02-13 NOTE — ED Provider Notes (Signed)
[facility] Emergency Department Provider Note ____________________________________________   Event Date/Time   First MD Initiated Contact with Patient 02/13/21 1123     (approximate)  I have reviewed the triage vital signs and the nursing notes.   HISTORY  Chief Complaint Bradycardia    HPI Christopher [name] is a 87 y.o. male with PMH as noted below including CAD status post CABG, atrial fibrillation, and hypertension who presents with lightheadedness and bradycardia.  The patient states that he has had multiple episodes of lightheadedness or "wooziness" over approximate last month, sometimes lasting a few minutes and sometimes a few hours.  These episodes are sometimes worsened when he stands up but not related to exertion.  He had an episode like this today and checked his heart rate and it was in the 30s.  He went to the fire department and was confirmed to have a heart rate in the 30s and a low blood pressure.  He states that the symptoms have subsequently resolved.  He also reports a right-sided headache that occurred last night and this morning but has also resolved.  He denies any chest pain or difficulty breathing.  He has no leg swelling or pain.   Past Medical History:  Diagnosis Date   Carotid artery stenosis    a. RICA 40%   Coronary artery disease    a. PCI/DES to mLAD and dRCA in 2005, residual OM1 50%; b. nuclear stress test 02/2008: no ischemia; c. nuclear stress test 11/2009: mild ischemia in the inferior wall c/w previous stress test; d. 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    GERD (gastroesophageal reflux disease) 01/30/2020   Thyroid nodule 01/30/2020   S/P CABG x 3 09/20/2014   Atrial fibrillation with RVR ([place]) 09/19/2014   NSTEMI (non-ST elevated myocardial infarction) ([place]) 09/19/2014   Borderline diabetes 06/12/2014   HTN (hypertension), benign 02/28/2014   Hepatotoxicity due to statin drug 11/02/2012   PVD (peripheral vascular disease) ([place]) 05/19/2011   Coronary atherosclerosis 12/18/2009   Mixed hyperlipidemia 06/19/2009    Past Surgical History:  Procedure Laterality Date   CARDIAC CATHETERIZATION     CHOLECYSTECTOMY  2016   CORONARY ARTERY BYPASS GRAFT N/A 09/20/2014   Procedure: CORONARY ARTERY BYPASS GRAFTING (CABG) x   three using left internal mammary artery and right leg greater saphenous vein harvested endoscopically;  Surgeon: Melrose Nakayama, MD;  Location: [place];  Service: Open Heart Surgery;  Laterality: N/A;   TEE WITHOUT CARDIOVERSION N/A 09/20/2014   Procedure: TRANSESOPHAGEAL ECHOCARDIOGRAM (TEE);  Surgeon: Melrose Nakayama, MD;  Location: [place];  Service: Open Heart Surgery;  Laterality: N/A;   TOTAL KNEE ARTHROPLASTY     bilateral    Prior to Admission medications   Medication Sig Start Date End Date Taking? Authorizing Provider  aspirin 81 MG EC tablet Take 81 mg by mouth daily.   Yes [provider]  celecoxib (CELEBREX) 200 MG capsule Take 200 mg by mouth daily. 01/19/21  Yes [provider]  cholecalciferol (VITAMIN D3) 25 MCG (1000 UNIT) tablet Take 1,000 Units by mouth daily.   Yes  [provider]  clopidogrel (PLAVIX) 75 MG tablet TAKE 1 TABLET BY MOUTH EVERY DAY 12/09/20  Yes Gollan, Kathlene November, MD  lisinopril (ZESTRIL) 2.5 MG tablet Take 2.5 mg by mouth daily. 12/25/20  Yes [provider]  Magnesium 250 MG TABS Take by mouth daily.   Yes [provider]  metFORMIN (GLUCOPHAGE-XR) 500 MG 24 hr tablet Take 500 mg by mouth in the morning and at bedtime. 02/07/21  Yes  [provider]  metoprolol tartrate (LOPRESSOR) 25 MG tablet Take 12.5 mg by mouth 2 (two) times daily. 10/05/20  Yes [provider]  omeprazole (PRILOSEC) 20 MG capsule Take 1 tablet by mouth daily. 07/03/20  Yes [provider]  rosuvastatin (CRESTOR) 5 MG tablet Take 5 mg by mouth daily. 06/06/20  Yes [provider]    Allergies Patient has no known allergies.  Family History  Problem Relation Age of Onset   Heart failure Mother     Social History Social History   Tobacco Use   Smoking status: Former    Types: Cigarettes    Quit date: 09/14/1955    Years since quitting: 65.4   Smokeless tobacco: Never  Vaping Use   Vaping Use: Never used  Substance Use Topics   Alcohol use: No   Drug use: No    Review of Systems  Constitutional: No fever/chills Eyes: No visual changes. ENT: No sore throat. Cardiovascular: Denies chest pain. Respiratory: Denies shortness of breath. Gastrointestinal: No vomiting or diarrhea.  Genitourinary: Negative for dysuria.  Musculoskeletal: Negative for back pain. Skin: Negative for rash. Neurological: Positive for resolved headache.   ____________________________________________   PHYSICAL EXAM:  VITAL SIGNS: ED Triage Vitals  Enc Vitals Group     BP 02/13/21 1006 (!) 163/75     Pulse Rate 02/13/21 1006 66     Resp 02/13/21 1006 16     Temp 02/13/21 1006 97.7 F (36.5 C)     Temp Source 02/13/21 1006 Oral     SpO2 02/13/21 1006 98 %     Weight 02/13/21 1002 208 lb (94.3 kg)     Height 02/13/21 1002 '5\' 11"'$  (1.803 m)     Head Circumference --      Peak Flow --      Pain Score 02/13/21 1002 0     Pain Loc --      Pain Edu? --      Excl. in [place]? --     Constitutional: Alert and oriented. Well appearing and in no acute distress. Eyes: Conjunctivae are normal.  Head: Atraumatic. Nose: No congestion/rhinnorhea. Mouth/Throat: Mucous membranes are moist.   Neck: Normal range of motion.   Cardiovascular: Borderline bradycardic, irregular rhythm. Grossly normal heart sounds.  Good peripheral circulation. Respiratory: Normal respiratory effort.  No retractions. Lungs CTAB. Gastrointestinal: No distention.  Musculoskeletal: No lower extremity edema.  Extremities warm and well perfused.  Neurologic:  Normal speech and language.  Motor intact in all extremities.  Normal coordination. Skin:  Skin is warm and dry. No rash noted. Psychiatric: Mood and affect are normal. Speech and behavior are normal.  ____________________________________________   LABS (all labs ordered are listed, but only abnormal results are displayed)  Labs Reviewed  BASIC METABOLIC PANEL - Abnormal; Notable for the following components:      Result Value   Glucose, Bld 263 (*)    BUN 24 (*)    GFR, Estimated 57 (*)    All other components within normal limits  CBC - Abnormal; Notable for the following components:   MCHC 36.1 (*)    Platelets 132 (*)    All other components within normal limits  TROPONIN I (HIGH SENSITIVITY)  TROPONIN I (HIGH SENSITIVITY)   ____________________________________________  EKG  ED ECG REPORT I, Arta Silence, the attending physician, personally viewed and interpreted this ECG.  Date: 02/13/2021 EKG Time: 1003 Rate: 62 Rhythm: Sinus bradycardia with frequent PVCs QRS Axis: normal Intervals: normal ST/T Wave abnormalities: Nonspecific ST abnormality Narrative Interpretation: no evidence of acute ischemia; no significant change when compared to EKG of 11/12/2020 except for frequent PVCs ____________________________________________  RADIOLOGY  Chest x-ray interpreted by me shows no focal consolidation or edema  ____________________________________________   PROCEDURES  Procedure(s) performed: No  Procedures  Critical Care performed: No ____________________________________________   INITIAL IMPRESSION / ASSESSMENT AND PLAN / ED  COURSE  Pertinent labs & imaging results that were available during my care of the patient were reviewed by me and considered in my medical decision making (see chart for details).   87 year old male with PMH as noted above including CAD status post CABG, atrial fibrillation, and hypertension presents with multiple episodes of lightheadedness over the last month, today associated with bradycardia.  The patient went to the fire department where they confirmed that his heart rate was 38 and blood pressure was as low as 77 systolic.  I reviewed the past medical records in Epic; the patient follows with Dr. Rockey Situ from cardiology and was last seen on 6/15.  He has had frequent PVCs in the past.  At that time he did report some occasional near syncope but was doing well on his current medication regimen.  On exam currently, the patient is very well-appearing for his age.  His vital signs are normal except for mild hypertension.  Heart rate has been in the 50s to 60s here without any intervention.  The physical exam is otherwise unremarkable.  Basic labs obtained from triage are unremarkable as well.  Differential includes bradycardia related to his metoprolol or other medications, versus primary etiologies such as a sick sinus syndrome.  We will obtain cardiac enzymes and continue to monitor the patient for a few hours.  I will discuss with Dr. Rockey Situ.  ----------------------------------------- 3:22 PM on 02/13/2021 -----------------------------------------  I discussed the case with Dr. Rockey Situ.  He confirms that the patient has had problems with PVCs and near syncope in the past.  He advises that as long as the patient remains asymptomatic, if he has negative troponins x2 he would be appropriate for discharge home with close follow-up next week.  On reassessment, the second troponin is negative and the patient has remained asymptomatic throughout his ED stay.  His heart rate has remained in the 50s  or low 60s.  He states he feels well and would prefer to go home if possible.  I counseled him on the results of the work-up and on Dr. Donivan Scull recommendations and the patient agrees.  Return precautions given, and he expresses understanding.   ____________________________________________   FINAL CLINICAL IMPRESSION(S) / ED DIAGNOSES  Final diagnoses:  Symptomatic bradycardia      NEW MEDICATIONS STARTED DURING THIS VISIT:  New Prescriptions   No medications on file     Note:  This document was prepared using Dragon voice recognition software and may include unintentional dictation errors.    Arta Silence, MD 02/13/21 [phone]

## 2021-03-11 ENCOUNTER — Encounter: Payer: Self-pay | Admitting: Cardiovascular Disease

## 2021-03-11 ENCOUNTER — Other Ambulatory Visit: Payer: Self-pay

## 2021-03-11 ENCOUNTER — Ambulatory Visit (INDEPENDENT_AMBULATORY_CARE_PROVIDER_SITE_OTHER): Payer: Medicare Other | Admitting: Cardiovascular Disease

## 2021-03-11 VITALS — BP 140/68 | HR 60 | Ht 71.0 in | Wt 213.5 lb

## 2021-03-11 DIAGNOSIS — I2510 Atherosclerotic heart disease of native coronary artery without angina pectoris: Secondary | ICD-10-CM | POA: Diagnosis not present

## 2021-03-11 DIAGNOSIS — I25118 Atherosclerotic heart disease of native coronary artery with other forms of angina pectoris: Secondary | ICD-10-CM | POA: Diagnosis not present

## 2021-03-11 DIAGNOSIS — I10 Essential (primary) hypertension: Secondary | ICD-10-CM | POA: Diagnosis not present

## 2021-03-11 DIAGNOSIS — I493 Ventricular premature depolarization: Secondary | ICD-10-CM | POA: Diagnosis not present

## 2021-03-11 DIAGNOSIS — I4891 Unspecified atrial fibrillation: Secondary | ICD-10-CM | POA: Diagnosis not present

## 2021-03-11 DIAGNOSIS — I739 Peripheral vascular disease, unspecified: Secondary | ICD-10-CM | POA: Diagnosis not present

## 2021-03-11 DIAGNOSIS — R55 Syncope and collapse: Secondary | ICD-10-CM

## 2021-03-11 NOTE — Progress Notes (Signed)
Christopher [name]  Date:  03/11/2021   ID:  Christopher [name], Senft 02-13-1932, MRN [id number]  PCP:  Sofie Hartigan, MD   Chief Complaint  Patient presents with   [facility] ER Follow Up    Patient c/o decreased heart rate running around 42 BPM. Medications reviewed by the patient verbally.     HPI:  [name] is a very pleasant 87 year old gentleman with a history of coronary artery disease,  stent placed to Christopher distal RCA in 2005, stent placed to Christopher mid LAD, catheterization 09/17/2014 showing critical ostial LAD and proximal left circumflex disease, transferred to [facility] for bypass surgery,  Postoperatively had atrial fibrillation, started on anticoagulation and amiodarone who presents today for follow-up of Christopher CAD and  three-vessel CABG.  Last seen in clinic June 2022  On Christopher last clinic visit with bradycardia Monitor reviewed, had a high burden PVCs 20% burden We had ordered echocardiogram, he did not have this completed as he was asymptomatic  On today's visit again reports using Christopher blood pressure cuff at home noted he was having bradycardia heart rates in the high 30s, was asymptomatic Went to fire dept, bradycardia noted, was referred to the emergency room to evaluate bradycardia and lightheadedness -In the emergency room blood pressure elevated 580 systolic EKG with PVCs, work-up negative and was discharged home  In follow-up today reports he is asymptomatic Denies orthostasis symptoms Having some atypical chest pain, feels superficial  Continues on metoprolol tartrate 12.5 twice daily  Weight stable Lab work reviewed A1C 7.6 Up from 6.7  EKG personally reviewed by myself on todays visit Sinus bradycardia rate 52 bpm frequent PVCs Nonspecific T wave abnormality  Other past medical history mild carotid plaquing with mixed irregular plaque estimated at 40% on the right,   History of total knee replacement with rehabilitation at [facility]. At the  rehabilitation, he was receiving atorvastatin 80 mg daily with Vytorin (dose unclear). He became very ill, had chills, rigors, sweating, urine was very dark. He was noticed to be jaundice and checked himself out of rehabilitation and went to the hospital, noted to have hepatitis. Symptoms resolved by holding the statin's. LFTs improved back to normal  Lipitor was restarted at low dose 20 mg    Prior episode of severe dizziness. He was driving at the time and had to pullover. Symptoms resolved without intervention. That night he developed upper respiratory infection with runny nose. Currently has severe cough and  unable to sleep. Additional mild episode of dizziness several days later. No further episodes since then   Cardiac catheter September 2005 shows 90% mid LAD, 80% distal RCA, 50% OM1 at the mid vessel, Taxus stent 2.75 x 24 mm stent to Christopher LAD, TAxus stent 2.5 x 16 mm stent to the RCA.    PMH:   has a past medical history of Carotid artery stenosis, Coronary artery disease, GERD (gastroesophageal reflux disease), Hyperlipidemia, Hypertension, and PVD (peripheral vascular disease) ([place]).  PSH:    Past Surgical History:  Procedure Laterality Date   CARDIAC CATHETERIZATION     CHOLECYSTECTOMY  2016   CORONARY ARTERY BYPASS GRAFT N/A 09/20/2014   Procedure: CORONARY ARTERY BYPASS GRAFTING (CABG) x   three using left internal mammary artery and right leg greater saphenous vein harvested endoscopically;  Surgeon: Melrose Nakayama, MD;  Location: [place];  Service: Open Heart Surgery;  Laterality: N/A;   TEE WITHOUT CARDIOVERSION N/A 09/20/2014   Procedure: TRANSESOPHAGEAL ECHOCARDIOGRAM (TEE);  Surgeon: Remo Lipps  Chaya Jan, MD;  Location: [place];  Service: Open Heart Surgery;  Laterality: N/A;   TOTAL KNEE ARTHROPLASTY     bilateral    Current Outpatient Medications  Medication Sig Dispense Refill   aspirin 81 MG EC tablet Take 81 mg by mouth daily.     celecoxib (CELEBREX) 200 MG  capsule Take 200 mg by mouth daily.     cholecalciferol (VITAMIN D3) 25 MCG (1000 UNIT) tablet Take 1,000 Units by mouth daily.     clopidogrel (PLAVIX) 75 MG tablet TAKE 1 TABLET BY MOUTH EVERY DAY 90 tablet 2   lisinopril (ZESTRIL) 2.5 MG tablet Take 2.5 mg by mouth daily.     Magnesium 250 MG TABS Take by mouth daily.     metFORMIN (GLUCOPHAGE-XR) 500 MG 24 hr tablet Take 500 mg by mouth in the morning and at bedtime.     metoprolol tartrate (LOPRESSOR) 25 MG tablet Take 12.5 mg by mouth 2 (two) times daily.     omeprazole (PRILOSEC) 20 MG capsule Take 1 tablet by mouth daily.     rosuvastatin (CRESTOR) 5 MG tablet Take 5 mg by mouth daily.     No current facility-administered medications for this visit.    Allergies:   Patient has no known allergies.   Social History:  The patient  reports that he quit smoking about 65 years ago. Christopher smoking use included cigarettes. He has never used smokeless tobacco. He reports that he does not drink alcohol and does not use drugs.   Family History:   family history includes Heart failure in Christopher mother.    Review of Systems: Review of Systems  Constitutional: Negative.   HENT: Negative.    Respiratory: Negative.    Cardiovascular: Negative.   Gastrointestinal: Negative.   Musculoskeletal: Negative.   Neurological: Negative.   Psychiatric/Behavioral: Negative.    All other systems reviewed and are negative.  PHYSICAL EXAM: VS:  BP 140/68 (BP Location: Left Arm, Patient Position: Sitting, Cuff Size: Normal)   Pulse 60   Ht 5\' 11"  (1.803 m)   Wt 213 lb 8 oz (96.8 kg)   SpO2 98%   BMI 29.78 kg/m  , BMI Body mass index is 29.78 kg/m.  Constitutional:  oriented to person, place, and time. No distress.  HENT:  Head: Grossly normal Eyes:  no discharge. No scleral icterus.  Neck: No JVD, no carotid bruits  Cardiovascular: Regular rate and rhythm, no murmurs appreciated Pulmonary/Chest: Clear to auscultation bilaterally, no wheezes or  rails Abdominal: Soft.  no distension.  no tenderness.  Musculoskeletal: Normal range of motion Neurological:  normal muscle tone. Coordination normal. No atrophy Skin: Skin warm and dry Psychiatric: normal affect, pleasant  Recent Labs: 02/13/2021: BUN 24; Creatinine, Ser 1.23; Hemoglobin 15.7; Platelets 132; Potassium 4.6; Sodium 136    Lipid Panel Lab Results  Component Value Date   CHOL 121 09/17/2014   HDL 33 (L) 09/17/2014   LDLCALC 62 09/17/2014   TRIG 129 09/17/2014    Wt Readings from Last 3 Encounters:  03/11/21 213 lb 8 oz (96.8 kg)  02/13/21 208 lb (94.3 kg)  11/12/20 208 lb 6 oz (94.5 kg)     ASSESSMENT AND PLAN:   Atrial fibrillation with RVR (H[facility])  Maintaining normal sinus rhythm Noted postoperatively following CABG  Near syncope Reports being very concerned about recent low heart rate on blood pressure cuff monitor at home, Again discussed PVCs can cause incorrect measurement of pulse rate on Christopher machine Reassurance  provided, he denies orthostasis symptoms Blood pressure measurements at home will also likely be affected by Christopher PVCs and in many cases may be inaccurate  Coronary artery disease involving native coronary artery of native heart without angina pectoris -  Currently with no symptoms of angina. No further workup at this time. Continue current medication regimen.  PVD (peripheral vascular disease) ([place]) Cholesterol is at goal on the current lipid regimen. No changes to the medications were made. No claudication  S/P CABG x 3 Currently with no symptoms of angina. No further workup at this time. Continue current medication regimen.  Type 2 diabetes with complications We have encouraged continued exercise, careful diet management in an effort to lose weight. HBA1C 7.3  Hyperlipidemia At goal  PVCs Frequent PVCs noted on monitor Has been asymptomatic Echocardiogram ordered again, he previously declined He is in agreement to proceed For  normal cardiac function, little intervention may be needed  For low ejection fraction, will refer to EP given not a good candidate for antiarrhythmics in the setting of borderline low heart rate    Total encounter time more than 25 minutes  Greater than 50% was spent in counseling and coordination of care with the patient     No orders of the defined types were placed in this encounter.    Signed, Esmond Plants, M.D., Ph.D. 03/11/2021  [place], [place]

## 2021-03-11 NOTE — Patient Instructions (Addendum)
Medication Instructions:  No changes  If you need a refill on your cardiac medications before your next appointment, please call your pharmacy.   Lab work: No new labs needed  Testing/Procedures: ECHO Your physician has requested that you have an echocardiogram. Echocardiography is a painless test that uses sound waves to create images of your heart. It provides your doctor with information about the size and shape of your heart and how well your heart's chambers and valves are working. This procedure takes approximately one hour. There are no restrictions for this procedure.  There is a possibility that an IV may need to be started during your test to inject an image enhancing agent. This is done to obtain more optimal pictures of your heart. Therefore we ask that you do at least drink some water prior to coming in to hydrate your veins.     Follow-Up: At [facility], you and your health needs are our priority.  As part of our continuing mission to provide you with exceptional heart care, we have created designated Provider Care Teams.  These Care Teams include your primary Cardiologist (physician) and Advanced Practice Providers (APPs -  Physician Assistants and Nurse Practitioners) who all work together to provide you with the care you need, when you need it.  You will need a follow up appointment in 6 months  Providers on your designated Care Team:   Murray Hodgkins, NP Christell Faith, PA-C Marrianne Mood, PA-C Cadence Blackshear, [place]  COVID-19 Vaccine Information can be found at: ShippingScam.co.uk For questions related to vaccine distribution or appointments, please email [email or web address] or call [phone])[phone].

## 2021-04-15 ENCOUNTER — Other Ambulatory Visit: Payer: Self-pay

## 2021-04-15 ENCOUNTER — Ambulatory Visit (INDEPENDENT_AMBULATORY_CARE_PROVIDER_SITE_OTHER): Payer: Medicare Other

## 2021-04-15 DIAGNOSIS — I2510 Atherosclerotic heart disease of native coronary artery without angina pectoris: Secondary | ICD-10-CM | POA: Diagnosis not present

## 2021-04-15 DIAGNOSIS — I493 Ventricular premature depolarization: Secondary | ICD-10-CM

## 2021-04-15 LAB — ECHOCARDIOGRAM COMPLETE
AR max vel: 1.93 cm2
AV Area VTI: 1.82 cm2
AV Area mean vel: 1.97 cm2
AV Mean grad: 5 mmHg
AV Peak grad: 8.6 mmHg
Ao pk vel: 1.47 m/s
Area-P 1/2: 3.08 cm2
Calc EF: 46 %
S' Lateral: 4.9 cm
Single Plane A2C EF: 42 %
Single Plane A4C EF: 49.6 %

## 2021-04-27 ENCOUNTER — Telehealth: Payer: Self-pay | Admitting: Cardiovascular Disease

## 2021-04-27 MED ORDER — METOPROLOL TARTRATE 25 MG PO TABS: 12.5000 mg | ORAL_TABLET | Freq: Two times a day (BID) | ORAL | 1 refills | Status: DC

## 2021-04-27 NOTE — Telephone Encounter (Signed)
Patient calling to check on status of ECHO results  Please call to discuss

## 2021-04-27 NOTE — Telephone Encounter (Signed)
*  STAT* If patient is at the pharmacy, call can be transferred to refill team.   1. Which medications need to be refilled? (please list name of each medication and dose if known) metoprolol tartrate 25 MG 12.5 mg 2 times daily   2. Which pharmacy/location (including street and city if local pharmacy) is medication to be sent to? [facility] in [place]   3. Do they need a 30 day or 90 day supply? 90 day

## 2021-04-27 NOTE — Telephone Encounter (Signed)
Requested Prescriptions   Signed Prescriptions Disp Refills   metoprolol tartrate (LOPRESSOR) 25 MG tablet 90 tablet 1    Sig: Take 0.5 tablets (12.5 mg total) by mouth 2 (two) times daily.    Authorizing Provider: Minna Merritts    Ordering User: Raelene Bott, Valda Christenson L

## 2021-04-27 NOTE — Telephone Encounter (Signed)
Able to reach pt regarding his recent ECHO Dr. Rockey Situ had a chance to review his results and advised   "Echocardiogram  Normal left and right cardiac function, no significant valve disease, normal pressures  Good study "  Christopher [name] very thankful for the phone call of his results, all questions and concerns were address with nothing further at this time. Will see at next schedule f/u appt.

## 2021-09-06 NOTE — Progress Notes (Signed)
Cardiology Office Note ? ?Date:  09/07/2021  ? ?ID:  Christopher [name], DOB [birth year], MRN [id number] ? ?PCP:  Sofie Hartigan, MD  ? ?Chief Complaint  ?Patient presents with  ? 6 month follow up   ?  "Doing well." Medications reviewed by the patient verbally.   ? ? ?HPI:  ?Christopher [name] is a very pleasant 88 year old gentleman with a history of ?coronary artery disease,  ?stent placed to his distal RCA in 2005, stent placed to his mid LAD, ?catheterization 09/17/2014 showing critical ostial LAD and proximal left circumflex disease, transferred to [facility] for bypass surgery,  ?Postoperatively had atrial fibrillation, started on anticoagulation and amiodarone ?who presents today for follow-up of his CAD and  three-vessel CABG. ? ?Last seen in clinic Oct 2022 ? 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 ?No anginal sx ? ?Told that he had a CVA by eye doctor, notes unavailable ?Numbness left side of his face, (essentially resolved now) ?Has follow-up with eye doctor several months time ? ?History of high burden PVCs ?20% burden on prior monitor early 2022 ?Echocardiogram November 2022 ? 1. Left ventricular ejection fraction, by estimation, is 55%. The left  ?ventricle has normal function. The left ventricle has no regional wall  ?motion abnormalities. Left ventricular diastolic parameters are consistent  ?with Grade II diastolic dysfunction  ?(pseudonormalization).  ? 2. Right ventricular systolic function is normal. The right ventricular  ?size is mildly enlarged.  ? 3. Left atrial size was mildly dilated.  ?Mild MR ? ?Labs reviewed ?A1C 7.2, reports his diet is poor ?Total chol 118, LDl 48 ? ?EKG personally reviewed by myself on todays visit ?Normal sinus rhythm rate 61 bpm no significant ST-T wave changes ? ?Past medical history reviewed. ?mild carotid plaquing with mixed irregular plaque estimated at 40% on the right, ?  ?History of total knee replacement with rehabilitation at  [facility]. At the rehabilitation, he was receiving atorvastatin 80 mg daily with Vytorin (dose unclear). ?He became very ill, had chills, rigors, sweating, urine was very dark. He was noticed to be jaundice and checked himself out of rehabilitation and went to the hospital, noted to have hepatitis. Symptoms resolved by holding the statin's. LFTs improved back to normal  ?Lipitor was restarted at low dose 20 mg  ?  ?Prior episode of severe dizziness. He was driving at the time and had to pullover. Symptoms resolved without intervention. That night he developed upper respiratory infection with runny nose. Currently has severe cough and  unable to sleep. Additional mild episode of dizziness several days later. No further episodes since then ?  ?Cardiac catheter September 2005 shows 90% mid LAD, 80% distal RCA, 50% OM1 at the mid vessel, Taxus stent 2.75 x 24 mm stent to his LAD, TAxus stent 2.5 x 16 mm stent to the RCA. ?  ? ?PMH:   has a past medical history of Carotid artery stenosis, Coronary artery disease, GERD (gastroesophageal reflux disease), Hyperlipidemia, Hypertension, and PVD (peripheral vascular disease) ([place]). ? ?PSH:    ?Past Surgical History:  ?Procedure Laterality Date  ? CARDIAC CATHETERIZATION    ? CHOLECYSTECTOMY  2016  ? CORONARY ARTERY BYPASS GRAFT N/A 09/20/2014  ? Procedure: CORONARY ARTERY BYPASS GRAFTING (CABG) x   three using left internal mammary artery and right leg greater saphenous vein harvested endoscopically;  Surgeon: Melrose Nakayama, MD;  Location: [place];  Service: Open Heart Surgery;  Laterality: N/A;  ? TEE  WITHOUT CARDIOVERSION N/A 09/20/2014  ? Procedure: TRANSESOPHAGEAL ECHOCARDIOGRAM (TEE);  Surgeon: Melrose Nakayama, MD;  Location: [place];  Service: Open Heart Surgery;  Laterality: N/A;  ? TOTAL KNEE ARTHROPLASTY    ? bilateral  ? ? ?Current Outpatient Medications  ?Medication Sig Dispense Refill  ? aspirin 81 MG EC tablet Take 81 mg by mouth daily.    ?  cholecalciferol (VITAMIN D3) 25 MCG (1000 UNIT) tablet Take 1,000 Units by mouth daily.    ? clopidogrel (PLAVIX) 75 MG tablet TAKE 1 TABLET BY MOUTH EVERY DAY 90 tablet 2  ? lisinopril (ZESTRIL) 2.5 MG tablet Take 2.5 mg by mouth daily.    ? Magnesium 250 MG TABS Take by mouth daily.    ? metFORMIN (GLUCOPHAGE-XR) 500 MG 24 hr tablet Take 500 mg by mouth in the morning and at bedtime.    ? metoprolol tartrate (LOPRESSOR) 25 MG tablet Take 0.5 tablets (12.5 mg total) by mouth 2 (two) times daily. 90 tablet 1  ? omeprazole (PRILOSEC) 20 MG capsule Take 1 tablet by mouth daily.    ? rosuvastatin (CRESTOR) 5 MG tablet Take 5 mg by mouth daily.    ? celecoxib (CELEBREX) 200 MG capsule Take 200 mg by mouth daily. (Patient not taking: Reported on 09/07/2021)    ? ?No current facility-administered medications for this visit.  ? ? ?Allergies:   Patient has no known allergies.  ? ?Social History:  The patient  reports that he quit smoking about 66 years ago. His smoking use included cigarettes. He has never used smokeless tobacco. He reports that he does not drink alcohol and does not use drugs.  ? ?Family History:   family history includes Heart failure in his mother.  ? ? ?Review of Systems: ?Review of Systems  ?Constitutional: Negative.   ?HENT: Negative.    ?Respiratory: Negative.    ?Cardiovascular: Negative.   ?Gastrointestinal: Negative.   ?Musculoskeletal: Negative.   ?Neurological: Negative.   ?Psychiatric/Behavioral: Negative.    ?All other systems reviewed and are negative. ? ?PHYSICAL EXAM: ?VS:  BP 128/62 (BP Location: Left Arm, Patient Position: Sitting, Cuff Size: Normal)   Pulse 61   Ht '5\' 11"'$  (1.803 m)   Wt 94.9 kg   SpO2 97%   BMI 29.18 kg/m?  , BMI Body mass index is 29.18 kg/m?Marland Kitchen  ?Constitutional:  oriented to person, place, and time. No distress.  ?HENT:  ?Head: Grossly normal ?Eyes:  no discharge. No scleral icterus.  ?Neck: No JVD, no carotid bruits  ?Cardiovascular: Regular rate and rhythm, no  murmurs appreciated ?Pulmonary/Chest: Clear to auscultation bilaterally, no wheezes or rails ?Abdominal: Soft.  no distension.  no tenderness.  ?Musculoskeletal: Normal range of motion ?Neurological:  normal muscle tone. Coordination normal. No atrophy ?Skin: Skin warm and dry ?Psychiatric: normal affect, pleasant ? ?Recent Labs: ?02/13/2021: BUN 24; Creatinine, Ser 1.23; Hemoglobin 15.7; Platelets 132; Potassium 4.6; Sodium 136  ? ? ?Lipid Panel ?Lab Results  ?Component Value Date  ? CHOL 121 09/17/2014  ? HDL 33 (L) 09/17/2014  ? [place] 62 09/17/2014  ? TRIG 129 09/17/2014  ? ? ?Wt Readings from Last 3 Encounters:  ?09/07/21 94.9 kg  ?03/11/21 96.8 kg  ?02/13/21 94.3 kg  ?  ? ?ASSESSMENT AND PLAN:  ? ?Atrial fibrillation with RVR ([place])  ?Maintaining normal sinus rhythm ?Noted postoperatively following CABG ?Denies symptoms concerning for paroxysmal tachycardia ?Continue low-dose metoprolol ? ?Near syncope ?Normal ejection fraction November 2022  ?high PVC burden on monitor March  2022 ?Difficult to treat in the setting of bradycardia, ?Has been asymptomatic recently ? ?Coronary artery disease involving native coronary artery of native heart without angina pectoris -  ?Currently with no symptoms of angina. No further workup at this time. Continue current medication regimen. ?Recommend trying to get his A1c down, diet modification ?Recommend he hold the Plavix.  Continue aspirin 81 daily ? ?PVD (peripheral vascular disease) ([place]) ?Cholesterol is at goal on the current lipid regimen. No changes to the medications were made. ?No claudication ? ?S/P CABG x 3 ?Currently with no symptoms of angina. No further workup at this time. Continue current medication regimen. ? ?Type 2 diabetes with complications ?We have encouraged continued exercise, careful diet management in an effort to lose weight. ?HBA1C 7.3 ?Recommended weight loss, walking program, limited by arthritis of the foot ? ?Hyperlipidemia ?At goal, no medication  changes made ? ?PVCs ?Frequent PVCs noted on monitor March 2022 ?Echocardiogram with normal ejection fraction  ?not a good candidate for antiarrhythmics in the setting of borderline low heart rate ?Currently asymptomatic,

## 2021-09-07 ENCOUNTER — Ambulatory Visit (INDEPENDENT_AMBULATORY_CARE_PROVIDER_SITE_OTHER): Payer: Medicare Other | Admitting: Cardiovascular Disease

## 2021-09-07 ENCOUNTER — Encounter: Payer: Self-pay | Admitting: Cardiovascular Disease

## 2021-09-07 VITALS — BP 128/62 | HR 61 | Ht 71.0 in | Wt 209.2 lb

## 2021-09-07 DIAGNOSIS — E782 Mixed hyperlipidemia: Secondary | ICD-10-CM

## 2021-09-07 DIAGNOSIS — I4891 Unspecified atrial fibrillation: Secondary | ICD-10-CM | POA: Diagnosis not present

## 2021-09-07 DIAGNOSIS — I25118 Atherosclerotic heart disease of native coronary artery with other forms of angina pectoris: Secondary | ICD-10-CM | POA: Diagnosis not present

## 2021-09-07 DIAGNOSIS — I10 Essential (primary) hypertension: Secondary | ICD-10-CM

## 2021-09-07 DIAGNOSIS — R55 Syncope and collapse: Secondary | ICD-10-CM

## 2021-09-07 DIAGNOSIS — I739 Peripheral vascular disease, unspecified: Secondary | ICD-10-CM

## 2021-09-07 DIAGNOSIS — Z951 Presence of aortocoronary bypass graft: Secondary | ICD-10-CM | POA: Diagnosis not present

## 2021-09-07 NOTE — Patient Instructions (Addendum)
Medication Instructions:  ?Stop plavix, stay on aspirin ? ?Ask PMD about : farxiga or jardiance if needed for high diabetes ? ?If you need a refill on your cardiac medications before your next appointment, please call your pharmacy.  ? ?Lab work: ?No new labs needed ? ?Testing/Procedures: ?No new testing needed ? ?Follow-Up: ?At [facility], you and your health needs are our priority.  As part of our continuing mission to provide you with exceptional heart care, we have created designated Provider Care Teams.  These Care Teams include your primary Cardiologist (physician) and Advanced Practice Providers (APPs -  Physician Assistants and Nurse Practitioners) who all work together to provide you with the care you need, when you need it. ? ?You will need a follow up appointment in 6  months, APP OK ? ?Providers on your designated Care Team:   ?Murray Hodgkins, NP ?Christell Faith, PA-C ?Cadence Kathlen Mody, PA-C ? ?COVID-19 Vaccine Information can be found at: [email or web address] For questions related to vaccine distribution or appointments, please email [email or web address] or call [phone].  ? ? ?

## 2021-10-07 ENCOUNTER — Ambulatory Visit (INDEPENDENT_AMBULATORY_CARE_PROVIDER_SITE_OTHER): Payer: Medicare Other

## 2021-10-07 ENCOUNTER — Other Ambulatory Visit (INDEPENDENT_AMBULATORY_CARE_PROVIDER_SITE_OTHER): Payer: Self-pay | Admitting: Podiatry

## 2021-10-07 DIAGNOSIS — I739 Peripheral vascular disease, unspecified: Secondary | ICD-10-CM | POA: Diagnosis not present

## 2021-11-30 ENCOUNTER — Emergency Department: Payer: Medicare Other

## 2021-11-30 ENCOUNTER — Other Ambulatory Visit: Payer: Self-pay

## 2021-11-30 ENCOUNTER — Emergency Department
Admission: EM | Admit: 2021-11-30 | Discharge: 2021-11-30 | Disposition: A | Discharge status: Home / Self Care | Payer: Medicare Other | Attending: Emergency Medicine | Admitting: Emergency Medicine

## 2021-11-30 DIAGNOSIS — N410 Acute prostatitis: Secondary | ICD-10-CM

## 2021-11-30 DIAGNOSIS — R110 Nausea: Secondary | ICD-10-CM | POA: Diagnosis not present

## 2021-11-30 DIAGNOSIS — D72829 Elevated white blood cell count, unspecified: Secondary | ICD-10-CM | POA: Insufficient documentation

## 2021-11-30 DIAGNOSIS — I10 Essential (primary) hypertension: Secondary | ICD-10-CM | POA: Insufficient documentation

## 2021-11-30 DIAGNOSIS — K6289 Other specified diseases of anus and rectum: Secondary | ICD-10-CM | POA: Diagnosis present

## 2021-11-30 DIAGNOSIS — I2510 Atherosclerotic heart disease of native coronary artery without angina pectoris: Secondary | ICD-10-CM | POA: Insufficient documentation

## 2021-11-30 LAB — CBC
HCT: 45.9 % (ref 39.0–52.0)
Hemoglobin: 16 g/dL (ref 13.0–17.0)
MCH: 31.3 pg (ref 26.0–34.0)
MCHC: 34.9 g/dL (ref 30.0–36.0)
MCV: 89.8 fL (ref 80.0–100.0)
Platelets: 117 10*3/uL — ABNORMAL LOW (ref 150–400)
RBC: 5.11 MIL/uL (ref 4.22–5.81)
RDW: 13.2 % (ref 11.5–15.5)
WBC: 23.3 10*3/uL — ABNORMAL HIGH (ref 4.0–10.5)
nRBC: 0 % (ref 0.0–0.2)

## 2021-11-30 LAB — COMPREHENSIVE METABOLIC PANEL
ALT: 15 U/L (ref 0–44)
AST: 14 U/L — ABNORMAL LOW (ref 15–41)
Albumin: 4.3 g/dL (ref 3.5–5.0)
Alkaline Phosphatase: 50 U/L (ref 38–126)
Anion gap: 4 — ABNORMAL LOW (ref 5–15)
BUN: 26 mg/dL — ABNORMAL HIGH (ref 8–23)
CO2: 24 mmol/L (ref 22–32)
Calcium: 9.1 mg/dL (ref 8.9–10.3)
Chloride: 105 mmol/L (ref 98–111)
Creatinine, Ser: 1.15 mg/dL (ref 0.61–1.24)
GFR, Estimated: >60 mL/min (ref >60)
Glucose, Bld: 250 mg/dL — ABNORMAL HIGH (ref 70–99)
Potassium: 4.5 mmol/L (ref 3.5–5.1)
Sodium: 133 mmol/L — ABNORMAL LOW (ref 135–145)
Total Bilirubin: 1.5 mg/dL — ABNORMAL HIGH (ref 0.3–1.2)
Total Protein: 7.1 g/dL (ref 6.5–8.1)

## 2021-11-30 LAB — URINALYSIS, ROUTINE W REFLEX MICROSCOPIC
Bilirubin Urine: NEGATIVE
Glucose, UA: 150 mg/dL — AB
Ketones, ur: 20 mg/dL — AB
Leukocytes,Ua: NEGATIVE
Nitrite: POSITIVE — AB
Protein, ur: 30 mg/dL — AB
Specific Gravity, Urine: 1.024 (ref 1.005–1.030)
Squamous Epithelial / HPF: NONE SEEN (ref 0–5)
pH: 5 (ref 5.0–8.0)

## 2021-11-30 LAB — LIPASE, BLOOD: Lipase: 37 U/L (ref 11–51)

## 2021-11-30 LAB — LACTIC ACID, PLASMA: Lactic Acid, Venous: 1.2 mmol/L (ref 0.5–1.9)

## 2021-11-30 MED ORDER — SODIUM CHLORIDE 0.9 % IV SOLN
1.0000 g | Freq: Once | INTRAVENOUS | Status: AC
  Administered 2021-11-30: 1 g via INTRAVENOUS
  Filled 2021-11-30: qty 10

## 2021-11-30 MED ORDER — SULFAMETHOXAZOLE-TRIMETHOPRIM 800-160 MG PO TABS: 1.0000 | ORAL_TABLET | Freq: Two times a day (BID) | ORAL | 0 refills | Status: AC

## 2021-11-30 MED ORDER — IOHEXOL 300 MG/ML  SOLN
100.0000 mL | Freq: Once | INTRAMUSCULAR | Status: AC | PRN
  Administered 2021-11-30: 100 mL via INTRAVENOUS

## 2021-11-30 NOTE — ED Provider Notes (Signed)
[facility] Provider Note    Event Date/Time   First MD Initiated Contact with Patient 11/30/21 0815     (approximate)   History   Abdominal Pain   HPI  Christopher [name] is a 88 y.o. male with a history of CAD, atrial fibrillation, and hypertension who presents with rectal pain since last night, described mainly as burning, and associated with some nausea.  The patient states over the last few days he has had a sensation of a ball in his rectum and believes it was due to swelling in his prostate.  He has also had some discomfort and frequency with urination.  He denies any fever or chills.  He has no diarrhea and states his bowel movements are normal.      Physical Exam   Triage Vital Signs: ED Triage Vitals  Enc Vitals Group     BP 11/30/21 0813 137/72     Pulse Rate 11/30/21 0813 95     Resp 11/30/21 0813 17     Temp 11/30/21 0813 98.7 F (37.1 C)     Temp Source 11/30/21 0813 Oral     SpO2 11/30/21 0813 94 %     Weight --      Height --      Head Circumference --      Peak Flow --      Pain Score 11/30/21 0808 7     Pain Loc --      Pain Edu? --      Excl. in [place]? --     Most recent vital signs: Vitals:   11/30/21 0813 11/30/21 1000  BP: 137/72 131/67  Pulse: 95 96  Resp: 17 16  Temp: 98.7 F (37.1 C)   SpO2: 94% 95%     General: Alert and oriented, well-appearing. CV:  Good peripheral perfusion.  Resp:  Normal effort.  Abd:  Soft and nontender.  No distention.  Other:  Prostate slightly enlarged.  Mild tenderness.  Otherwise normal rectal exam.   ED Results / Procedures / Treatments   Labs (all labs ordered are listed, but only abnormal results are displayed) Labs Reviewed  COMPREHENSIVE METABOLIC PANEL - Abnormal; Notable for the following components:      Result Value   Sodium 133 (*)    Glucose, Bld 250 (*)    BUN 26 (*)    AST 14 (*)    Total Bilirubin 1.5 (*)    Anion gap 4 (*)    All other components within  normal limits  CBC - Abnormal; Notable for the following components:   WBC 23.3 (*)    Platelets 117 (*)    All other components within normal limits  URINALYSIS, ROUTINE W REFLEX MICROSCOPIC - Abnormal; Notable for the following components:   Color, Urine YELLOW (*)    APPearance HAZY (*)    Glucose, UA 150 (*)    Hgb urine dipstick MODERATE (*)    Ketones, ur 20 (*)    Protein, ur 30 (*)    Nitrite POSITIVE (*)    Bacteria, UA RARE (*)    All other components within normal limits  LIPASE, BLOOD  LACTIC ACID, PLASMA  LACTIC ACID, PLASMA     EKG     RADIOLOGY  CT pelvis: I independently viewed and interpreted the images; there is an enlarged prostate with no evidence of inflammation or stranding, and no fluid collection or abscess.  Radiology report confirms no acute abnormality.  PROCEDURES:  Critical Care performed: No  Procedures   MEDICATIONS ORDERED IN ED: Medications  cefTRIAXone (ROCEPHIN) 1 g in sodium chloride 0.9 % 100 mL IVPB (has no administration in time range)  iohexol (OMNIPAQUE) 300 MG/ML solution 100 mL (100 mLs Intravenous Contrast Given 11/30/21 1010)     IMPRESSION / MDM / ASSESSMENT AND PLAN / ED COURSE  I reviewed the triage vital signs and the nursing notes.  88 year old male with PMH as noted above presents with rectal pain over the last 1 to 2 days associated with some urinary frequency but no change in his bowel movements.  He has no abdominal pain.  I reviewed the past medical records.  The patient has no recent ED visits or admissions.  He was last seen by his PMD on 5/5 for routine follow-up.  I do not see any documented history of prostatitis or UTI.  On exam the patient is well-appearing.  His vital signs are normal.  The abdomen is soft and nontender.  Rectal exam reveals prostate enlargement and mild tenderness with no other acute findings.  Differential diagnosis includes, but is not limited to, prostatitis, UTI/cystitis, proctitis,  colitis/diverticulitis.  Patient's presentation is most consistent with acute complicated illness / injury requiring diagnostic workup.  We will obtain labs and a CT of the pelvis.  The patient is having only rectal pain, not any abdominal pain, so does not require abdominal imaging.  ----------------------------------------- 11:40 AM on 11/30/2021 -----------------------------------------  CT shows enlarged prostate with no acute abnormality.  Urinalysis shows nitrates, WBCs, and some bacteria.  The patient has an elevated WBC count at 23.3.  His other labs are unremarkable, including a normal lactate.  Overall presentation is consistent with UTI and possible prostatitis.  I did consider whether the patient would warrant admission given his age and the  high WBC count.  However, the lactate is normal, the vital signs are normal, the patient is very well-appearing, is tolerating p.o., and has not required any pain medication in the ED.  There is no clinical evidence for systemic infection or sepsis.  The patient would strongly prefer to go home if possible.  I feel that this is reasonable.  We will give a dose of IV ceftriaxone in the ED and then start the patient on a 2-week course of Bactrim.  He is instructed to follow-up with his PMD.  Return precautions given, and he expresses understanding.   FINAL CLINICAL IMPRESSION(S) / ED DIAGNOSES   Final diagnoses:  Acute prostatitis     Rx / DC Orders   ED Discharge Orders          Ordered    sulfamethoxazole-trimethoprim (BACTRIM DS) 800-160 MG tablet  2 times daily        11/30/21 1139             Note:  This document was prepared using Dragon voice recognition software and may include unintentional dictation errors.    Arta Silence, MD 11/30/21 1143

## 2021-11-30 NOTE — ED Notes (Signed)
Edp was at bedside with pt. Pt to ED complaining of rectal fullness and burning and recently was diagnosed with enlarged prostate. Pt is urinating normally (no dribbling) but states he feels "a ball in there" and burning in rectal area.

## 2021-11-30 NOTE — Discharge Instructions (Addendum)
Take the antibiotic as prescribed and finish the full 2-week course even if you are feeling better.  Follow-up with your primary care doctor within the next week.  If your symptoms persist you may need to continue on the antibiotic for more than 2 weeks.  You should discuss this with your doctor.  Return to the ER for new, worsening, or persistent severe rectal or abdominal pain, fever, vomiting, inability to take the medication, or any other new or worsening symptoms that concern you.

## 2021-11-30 NOTE — ED Triage Notes (Signed)
Pt comes with c/o prostate pain. Pt states burning pain. Pt states he was told his prostate was enlarged a little bit. Pt states pain started last night and has gotten worse. Pt denies any urinary symptoms.

## 2021-12-02 ENCOUNTER — Other Ambulatory Visit: Payer: Self-pay

## 2021-12-02 ENCOUNTER — Emergency Department
Admission: EM | Admit: 2021-12-02 | Discharge: 2021-12-02 | Disposition: A | Discharge status: Home / Self Care | Payer: Medicare Other | Attending: Emergency Medicine | Admitting: Emergency Medicine

## 2021-12-02 ENCOUNTER — Encounter: Payer: Self-pay | Admitting: Emergency Medicine

## 2021-12-02 DIAGNOSIS — K6289 Other specified diseases of anus and rectum: Secondary | ICD-10-CM | POA: Diagnosis present

## 2021-12-02 LAB — URINALYSIS, ROUTINE W REFLEX MICROSCOPIC
Bilirubin Urine: NEGATIVE
Glucose, UA: NEGATIVE mg/dL
Hgb urine dipstick: NEGATIVE
Ketones, ur: 20 mg/dL — AB
Leukocytes,Ua: NEGATIVE
Nitrite: NEGATIVE
Protein, ur: 100 mg/dL — AB
Specific Gravity, Urine: 1.026 (ref 1.005–1.030)
Squamous Epithelial / HPF: NONE SEEN (ref 0–5)
pH: 5 (ref 5.0–8.0)

## 2021-12-02 LAB — COMPREHENSIVE METABOLIC PANEL
ALT: 23 U/L (ref 0–44)
AST: 20 U/L (ref 15–41)
Albumin: 4.1 g/dL (ref 3.5–5.0)
Alkaline Phosphatase: 64 U/L (ref 38–126)
Anion gap: 10 (ref 5–15)
BUN: 30 mg/dL — ABNORMAL HIGH (ref 8–23)
CO2: 24 mmol/L (ref 22–32)
Calcium: 8.7 mg/dL — ABNORMAL LOW (ref 8.9–10.3)
Chloride: 96 mmol/L — ABNORMAL LOW (ref 98–111)
Creatinine, Ser: 1.49 mg/dL — ABNORMAL HIGH (ref 0.61–1.24)
GFR, Estimated: 45 mL/min — ABNORMAL LOW (ref >60)
Glucose, Bld: 230 mg/dL — ABNORMAL HIGH (ref 70–99)
Potassium: 4.7 mmol/L (ref 3.5–5.1)
Sodium: 130 mmol/L — ABNORMAL LOW (ref 135–145)
Total Bilirubin: 1.2 mg/dL (ref 0.3–1.2)
Total Protein: 7.3 g/dL (ref 6.5–8.1)

## 2021-12-02 LAB — CBC WITH DIFFERENTIAL/PLATELET
Abs Immature Granulocytes: 0.06 10*3/uL (ref 0.00–0.07)
Basophils Absolute: 0 10*3/uL (ref 0.0–0.1)
Basophils Relative: 0 %
Eosinophils Absolute: 0 10*3/uL (ref 0.0–0.5)
Eosinophils Relative: 0 %
HCT: 45.2 % (ref 39.0–52.0)
Hemoglobin: 15.6 g/dL (ref 13.0–17.0)
Immature Granulocytes: 1 %
Lymphocytes Relative: 5 %
Lymphs Abs: 0.5 10*3/uL — ABNORMAL LOW (ref 0.7–4.0)
MCH: 31.6 pg (ref 26.0–34.0)
MCHC: 34.5 g/dL (ref 30.0–36.0)
MCV: 91.5 fL (ref 80.0–100.0)
Monocytes Absolute: 0.7 10*3/uL (ref 0.1–1.0)
Monocytes Relative: 7 %
Neutro Abs: 9.9 10*3/uL — ABNORMAL HIGH (ref 1.7–7.7)
Neutrophils Relative %: 87 %
Platelets: 125 10*3/uL — ABNORMAL LOW (ref 150–400)
RBC: 4.94 MIL/uL (ref 4.22–5.81)
RDW: 13.2 % (ref 11.5–15.5)
WBC: 11.3 10*3/uL — ABNORMAL HIGH (ref 4.0–10.5)
nRBC: 0 % (ref 0.0–0.2)

## 2021-12-02 LAB — LIPASE, BLOOD: Lipase: 38 U/L (ref 11–51)

## 2021-12-02 LAB — LACTIC ACID, PLASMA: Lactic Acid, Venous: 1.3 mmol/L (ref 0.5–1.9)

## 2021-12-02 MED ORDER — LIDOCAINE VISCOUS HCL 2 % MT SOLN: 15.0000 mL | OROMUCOSAL | 0 refills | Status: DC | PRN

## 2021-12-02 MED ORDER — LIDOCAINE VISCOUS HCL 2 % MT SOLN
15.0000 mL | Freq: Once | OROMUCOSAL | Status: AC
  Administered 2021-12-02: 15 mL via OROMUCOSAL
  Filled 2021-12-02: qty 15

## 2021-12-02 NOTE — Discharge Instructions (Signed)
Please seek medical attention for any high fevers, chest pain, shortness of breath, change in behavior, persistent vomiting, bloody stool or any other new or concerning symptoms.  

## 2021-12-02 NOTE — ED Triage Notes (Signed)
Patient to ED via POV for generalized weakness/ prostate pain. Patient seen here on Monday for same and given prescription for antibiotic- not seeing improvement. Patient c/o back pain and burning with urination.

## 2021-12-02 NOTE — ED Provider Triage Note (Signed)
Emergency Medicine Provider Triage Evaluation Note  Christopher [name] , a 88 y.o. male  was evaluated in triage.  Pt complains of generalized weakness and prostate feeling "full." Evaluated here on Monday for the same. Diagnosed with prostatitis and sent home on antibiotics. No improvement. Unable to get in with PCP but called for advice and advised to come to the ER.  Physical Exam  BP 107/61 (BP Location: Left Arm)   Pulse 84   Temp 98.6 F (37 C) (Oral)   Resp 18   Wt 89.8 kg   SpO2 97%   BMI 27.62 kg/m  Gen:   Awake, no distress   Resp:  Normal effort  MSK:   Moves extremities without difficulty  Other:    Medical Decision Making  Medically screening exam initiated at 10:58 AM.  Appropriate orders placed.  Christopher [name] was informed that the remainder of the evaluation will be completed by another provider, this initial triage assessment does not replace that evaluation, and the importance of remaining in the ED until their evaluation is complete.    Victorino Dike, FNP 12/02/21 1100

## 2021-12-02 NOTE — ED Notes (Signed)
ED Provider at bedside. 

## 2021-12-02 NOTE — ED Provider Notes (Signed)
[facility] Provider Note    Event Date/Time   First MD Initiated Contact with Patient 12/02/21 1610     (approximate)   History   Weakness   HPI  Christopher [name] is a 88 y.o. male  who presents to the emergency department today because of concern for rectal and anal pain. The patient has now had the pain for a couple of days.  The patient was seen in the emergency department 2 days ago and diagnosed with prostatitis.  States he has been taking his antibiotics but the pain has persistent.  It was worse this morning than it is at the time my exam.  He states it is right at his anus.  He denies any discomfort when urinating.  Physical Exam   Triage Vital Signs: ED Triage Vitals  Enc Vitals Group     BP 12/02/21 1038 107/61     Pulse Rate 12/02/21 1038 84     Resp 12/02/21 1038 18     Temp 12/02/21 1038 98.6 F (37 C)     Temp Source 12/02/21 1038 Oral     SpO2 12/02/21 1038 97 %     Weight 12/02/21 1048 198 lb (89.8 kg)     Height --      Head Circumference --      Peak Flow --      Pain Score 12/02/21 1048 7   Most recent vital signs: Vitals:   12/02/21 1038  BP: 107/61  Pulse: 84  Resp: 18  Temp: 98.6 F (37 C)  SpO2: 97%    General: Awake, alert, oriented. CV:  Good peripheral perfusion. Regular rate and rhythm. Resp:  Normal effort. Lungs clear. Abd:  No distention. Non tender.   ED Results / Procedures / Treatments   Labs (all labs ordered are listed, but only abnormal results are displayed) Labs Reviewed  COMPREHENSIVE METABOLIC PANEL - Abnormal; Notable for the following components:      Result Value   Sodium 130 (*)    Chloride 96 (*)    Glucose, Bld 230 (*)    BUN 30 (*)    Creatinine, Ser 1.49 (*)    Calcium 8.7 (*)    GFR, Estimated 45 (*)    All other components within normal limits  CBC WITH DIFFERENTIAL/PLATELET - Abnormal; Notable for the following components:   WBC 11.3 (*)    Platelets 125 (*)    Neutro Abs  9.9 (*)    Lymphs Abs 0.5 (*)    All other components within normal limits  LIPASE, BLOOD  URINALYSIS, ROUTINE W REFLEX MICROSCOPIC  LACTIC ACID, PLASMA  LACTIC ACID, PLASMA     EKG  I, Nance Pear, attending physician, personally viewed and interpreted this EKG  EKG Time: 1645 Rate: 92 Rhythm: sinus rhythm Axis: normal Intervals: qtc 534 QRS: normal ST changes: no st elevation Impression: abnormal ekg    RADIOLOGY None   PROCEDURES:  Critical Care performed: No  Procedures   MEDICATIONS ORDERED IN ED: Medications - No data to display   IMPRESSION / MDM / [place] / ED COURSE  I reviewed the triage vital signs and the nursing notes.                              Differential diagnosis includes, but is not limited to, hemorrhoids, anal fissure, prostatitis.  Patient's presentation is most consistent with acute presentation  with potential threat to life or bodily function.  Patient presented to the emergency department today because of concern for pain at his anus.  Was recently diagnosed with proctitis.  White blood cell count in the blood has improved today over what it was 2 days ago.  Urine no longer nitrite positive.  Patient did try using some viscous lidocaine which she stated significantly helped his pain.  At this time I think is reasonable for patient be discharged home.  Will give prescription for viscous lidocaine.   FINAL CLINICAL IMPRESSION(S) / ED DIAGNOSES   Final diagnoses:  Anal pain     Note:  This document was prepared using Dragon voice recognition software and may include unintentional dictation errors.    Nance Pear, MD 12/02/21 [phone]

## 2021-12-02 NOTE — ED Notes (Addendum)
Pt. States he has lost 20lbs over 4 months without trying. Dr. Archie Balboa notified.

## 2021-12-04 ENCOUNTER — Encounter: Payer: Self-pay | Admitting: Urology

## 2021-12-04 ENCOUNTER — Ambulatory Visit (INDEPENDENT_AMBULATORY_CARE_PROVIDER_SITE_OTHER): Payer: Medicare Other | Admitting: Urology

## 2021-12-04 VITALS — BP 129/76 | HR 79 | Ht 71.0 in | Wt 198.0 lb

## 2021-12-04 DIAGNOSIS — N179 Acute kidney failure, unspecified: Secondary | ICD-10-CM | POA: Diagnosis not present

## 2021-12-04 DIAGNOSIS — I25118 Atherosclerotic heart disease of native coronary artery with other forms of angina pectoris: Secondary | ICD-10-CM | POA: Diagnosis not present

## 2021-12-04 DIAGNOSIS — R634 Abnormal weight loss: Secondary | ICD-10-CM | POA: Diagnosis not present

## 2021-12-04 DIAGNOSIS — N410 Acute prostatitis: Secondary | ICD-10-CM | POA: Diagnosis not present

## 2021-12-04 NOTE — Progress Notes (Signed)
12/04/2021 8:45 AM   Christopher [name] 1931/11/18 [id number]  Referring provider: Sofie Hartigan, [address],  [place] [address]  No chief complaint on file.   HPI: 88 year old male who presents today for further evaluation of multiple ER visits for presumed prostatitis.  He presented on 11/30/2021 with rectal pain which she describes as a "ball" in his rectum which he felt was prostate related.  He had a leukocytosis of 23.3.  At that time, his urinalysis was mildly suspicious, positive for nitrates, small leukocytes and rare bacteria.  Unfortunately, no culture was sent.  Rectal exam at the time indicated prostamegaly with mild tenderness.  He was administered IV ceftriaxone and given a course of 2 weeks of Bactrim.  Return to the ER on 12/02/2021 2 days later.  He had persistent pain despite antibiotics.  He reports that the pain was near the anus.  Urinalysis appeared more benign, no longer nitrate positive and his leukocytosis was improving.  His creatinine was however rising up to 1.5.  He was treated with some viscous lidocaine and his pain improved.  He was discharged with urologic follow-up.  Most recent PSA 2.46    PMH: Past Medical History:  Diagnosis Date   Carotid artery stenosis    a. RICA 40%   Coronary artery disease    a. PCI/DES to mLAD and dRCA in 2005, residual OM1 50%; b. nuclear stress test 02/2008: no ischemia; c. nuclear stress test 11/2009: mild ischemia in the inferior wall c/w previous stress test; d. cath 09/17/2014: critical ostial/prox LAD estimated at 90-95%, critical prox LCx, RCA w/ mild diff dz, EF >55%, no WMA   GERD (gastroesophageal reflux disease)    Hyperlipidemia    Hypertension    PVD (peripheral vascular disease) ([place])     Surgical History: Past Surgical History:  Procedure Laterality Date   CARDIAC CATHETERIZATION     CHOLECYSTECTOMY  2016   CORONARY ARTERY BYPASS GRAFT N/A 09/20/2014   Procedure: CORONARY ARTERY BYPASS  GRAFTING (CABG) x   three using left internal mammary artery and right leg greater saphenous vein harvested endoscopically;  Surgeon: Melrose Nakayama, MD;  Location: [facility];  Service: Open Heart Surgery;  Laterality: N/A;   TEE WITHOUT CARDIOVERSION N/A 09/20/2014   Procedure: TRANSESOPHAGEAL ECHOCARDIOGRAM (TEE);  Surgeon: Melrose Nakayama, MD;  Location: [facility];  Service: Open Heart Surgery;  Laterality: N/A;   TOTAL KNEE ARTHROPLASTY     bilateral    Home Medications:  Allergies as of 12/04/2021   No Known Allergies      Medication List        Accurate as of December 04, 2021  8:45 AM. If you have any questions, ask your nurse or doctor.          aspirin EC 81 MG tablet Take 81 mg by mouth daily.   celecoxib 200 MG capsule Commonly known as: CELEBREX Take 200 mg by mouth daily.   cholecalciferol 25 MCG (1000 UNIT) tablet Commonly known as: VITAMIN D3 Take 1,000 Units by mouth daily.   lidocaine 2 % solution Commonly known as: XYLOCAINE Use as directed 15 mLs in the mouth or throat as needed (Anal pain).   lisinopril 2.5 MG tablet Commonly known as: ZESTRIL Take 2.5 mg by mouth daily.   Magnesium 250 MG Tabs Take by mouth daily.   metFORMIN 500 MG 24 hr tablet Commonly known as: GLUCOPHAGE-XR Take 500 mg by mouth in the morning and at  bedtime.   metoprolol tartrate 25 MG tablet Commonly known as: LOPRESSOR Take 0.5 tablets (12.5 mg total) by mouth 2 (two) times daily.   omeprazole 20 MG capsule Commonly known as: PRILOSEC Take 1 tablet by mouth daily.   rosuvastatin 5 MG tablet Commonly known as: CRESTOR Take 5 mg by mouth daily.   sulfamethoxazole-trimethoprim 800-160 MG tablet Commonly known as: BACTRIM DS Take 1 tablet by mouth 2 (two) times daily for 14 days.        Allergies: No Known Allergies  Family History: Family History  Problem Relation Age of Onset   Heart failure Mother     Social History:  reports that he quit smoking about  66 years ago. His smoking use included cigarettes. He has never used smokeless tobacco. He reports that he does not drink alcohol and does not use drugs.   Physical Exam: There were no vitals taken for this visit.  Constitutional:  Alert and oriented, No acute distress. HEENT: [place] AT, moist mucus membranes.  Trachea midline, no masses. Cardiovascular: No clubbing, cyanosis, or edema. Respiratory: Normal respiratory effort, no increased work of breathing. GI: Abdomen is soft, nontender, nondistended, no abdominal masses GU: No CVA tenderness Skin: No rashes, bruises or suspicious lesions. Neurologic: Grossly intact, no focal deficits, moving all 4 extremities. Psychiatric: Normal mood and affect.  Laboratory Data: Lab Results  Component Value Date   WBC 11.3 (H) 12/02/2021   HGB 15.6 12/02/2021   HCT 45.2 12/02/2021   MCV 91.5 12/02/2021   PLT 125 (L) 12/02/2021    Lab Results  Component Value Date   CREATININE 1.49 (H) 12/02/2021    Lab Results  Component Value Date   HGBA1C 6.6 (H) 09/18/2014    Urinalysis    Pertinent Imaging: CT of the pelvis with contrast on 11/30/2021 personally reviewed.  He has mild prostamegaly but otherwise unremarkable.  Assessment & Plan:    There are no diagnoses linked to this encounter.  No follow-ups on file.  Hollice Espy, MD  [facility] [address] [place], [place] [address] [phone]

## 2021-12-07 ENCOUNTER — Telehealth: Payer: Self-pay | Admitting: Gastroenterology

## 2021-12-07 NOTE — Telephone Encounter (Signed)
PT was seen in the ER over the weekend  with severe stomach pain and needs a follow-up within the next two weeks is there any dates available

## 2021-12-08 NOTE — Telephone Encounter (Signed)
Patient was contacted and I was able to schedule him an appointment to be seen on 12/14/2021 at 2:15 PM. Patient agreed and stated that he will be here for the appointment.

## 2021-12-14 ENCOUNTER — Ambulatory Visit: Payer: Federal, State, Local not specified - PPO | Admitting: Urology

## 2021-12-14 ENCOUNTER — Encounter: Payer: Self-pay | Admitting: Gastroenterology

## 2021-12-14 ENCOUNTER — Ambulatory Visit (INDEPENDENT_AMBULATORY_CARE_PROVIDER_SITE_OTHER): Payer: Medicare Other | Admitting: Gastroenterology

## 2021-12-14 ENCOUNTER — Other Ambulatory Visit: Payer: Self-pay

## 2021-12-14 VITALS — BP 149/86 | HR 60 | Temp 98.1°F | Ht 71.0 in | Wt 202.4 lb

## 2021-12-14 DIAGNOSIS — I25118 Atherosclerotic heart disease of native coronary artery with other forms of angina pectoris: Secondary | ICD-10-CM | POA: Diagnosis not present

## 2021-12-14 DIAGNOSIS — K6289 Other specified diseases of anus and rectum: Secondary | ICD-10-CM | POA: Diagnosis not present

## 2021-12-14 MED ORDER — NA SULFATE-K SULFATE-MG SULF 17.5-3.13-1.6 GM/177ML PO SOLN: 1.0000 | Freq: Once | ORAL | 0 refills | Status: AC

## 2021-12-14 NOTE — Progress Notes (Signed)
Christopher Bellows MD, MRCP(U.K) [address]  [place]  [place],  [address]  Main: [phone]  Fax: [phone]   Primary Care Physician: Christopher Hartigan, MD  Primary Gastroenterologist:  Dr. Jonathon [name]   Chief Complaint  Patient presents with   Rectal Pain    HPI: Christopher [name] is a 88 y.o. male   Summary of history :  Initially referred and seen back in 05/2020 for rectal fullness that occurred in 02/2020 and resolved. Treated in 12/04/2021 for symptoms suggestive of prostatitis with antibiotics. Returned to the ER 2 days later with pain in the anal area . Since his symptoms were not typical he was suggested to undergo a colonoscopy .   11/30/2021: CT pelvis with contrast showed prostatomegaly with no other abnormalities.  Symptoms continue and he would like to proceed with a colonoscopy not on any blood thinners   Current Outpatient Medications  Medication Sig Dispense Refill   aspirin 81 MG EC tablet Take 81 mg by mouth daily.     cholecalciferol (VITAMIN D3) 25 MCG (1000 UNIT) tablet Take 1,000 Units by mouth daily.     GABAPENTIN PO Take by mouth.     lisinopril (ZESTRIL) 2.5 MG tablet Take 2.5 mg by mouth daily.     Magnesium 250 MG TABS Take by mouth daily.     metFORMIN (GLUCOPHAGE-XR) 500 MG 24 hr tablet Take 500 mg by mouth in the morning and at bedtime.     metoprolol tartrate (LOPRESSOR) 25 MG tablet Take 0.5 tablets (12.5 mg total) by mouth 2 (two) times daily. 90 tablet 1   omeprazole (PRILOSEC) 20 MG capsule Take 1 tablet by mouth daily.     rosuvastatin (CRESTOR) 5 MG tablet Take 5 mg by mouth daily.     celecoxib (CELEBREX) 200 MG capsule Take 200 mg by mouth daily. (Patient not taking: Reported on 12/14/2021)     lidocaine (XYLOCAINE) 2 % solution Use as directed 15 mLs in the mouth or throat as needed (Anal pain). (Patient not taking: Reported on 12/14/2021) 15 mL 0   sulfamethoxazole-trimethoprim (BACTRIM DS) 800-160 MG tablet Take 1 tablet by  mouth 2 (two) times daily for 14 days. (Patient not taking: Reported on 12/14/2021) 28 tablet 0   No current facility-administered medications for this visit.    Allergies as of 12/14/2021   (No Known Allergies)    ROS:  General: Negative for anorexia, weight loss, fever, chills, fatigue, weakness. ENT: Negative for hoarseness, difficulty swallowing , nasal congestion. CV: Negative for chest pain, angina, palpitations, dyspnea on exertion, peripheral edema.  Respiratory: Negative for dyspnea at rest, dyspnea on exertion, cough, sputum, wheezing.  GI: See history of present illness. GU:  Negative for dysuria, hematuria, urinary incontinence, urinary frequency, nocturnal urination.  Endo: Negative for unusual weight change.    Physical Examination:   BP (!) 149/86 (BP Location: Left Arm, Patient Position: Sitting, Cuff Size: Normal)   Pulse 60   Temp 98.1 F (36.7 C) (Oral)   Ht '5\' 11"'$  (1.803 m)   Wt 202 lb 6.4 oz (91.8 kg)   BMI 28.23 kg/m   General: Well-nourished, well-developed in no acute distress.  Eyes: No icterus. Conjunctivae pink. Mouth: Oropharyngeal mucosa moist and pink , no lesions erythema or exudate. Lungs: Clear to auscultation bilaterally. Non-labored. Heart: Regular rate and rhythm, no murmurs rubs or gallops.  Abdomen: Bowel sounds are normal, nontender, nondistended, no hepatosplenomegaly or masses, no abdominal bruits or hernia , no  rebound or guarding.   Extremities: No lower extremity edema. No clubbing or deformities. Neuro: Alert and oriented x 3.  Grossly intact. Psych: Alert and cooperative, normal mood and affect.   Imaging Studies: CT PELVIS W CONTRAST  Result Date: 11/30/2021 CLINICAL DATA:  Prostatitis EXAM: CT PELVIS WITH CONTRAST TECHNIQUE: Multidetector CT imaging of the pelvis was performed using the standard protocol following the bolus administration of intravenous contrast. RADIATION DOSE REDUCTION: This exam was performed according to  the departmental dose-optimization program which includes automated exposure control, adjustment of the mA and/or kV according to patient size and/or use of iterative reconstruction technique. CONTRAST:  186m OMNIPAQUE IOHEXOL 300 MG/ML  SOLN COMPARISON:  CT abdomen and pelvis dated July 04, 2014 FINDINGS: Urinary Tract:  No abnormality visualized. Bowel: Diverticulosis. No wall thickening, inflammatory change or dilation of the visualized bowel. Vascular/Lymphatic: No pathologically enlarged lymph nodes. Aortoiliac atherosclerotic disease. Reproductive: Prostatomegaly, measuring up to 6.2 cm. No evidence of prostatic abscess. Other: Small fat containing left inguinal hernia. No pelvic ascites. Musculoskeletal: No suspicious bone lesions identified. IMPRESSION: 1. Prostatomegaly with no CT evidence of prostate abscess. 2.  Aortic Atherosclerosis (ICD10-I70.0). Electronically Signed   By: Christopher GlassmanM.D.   On: 11/30/2021 10:27    Assessment and Plan:   Christopher CHOUNGis a 88y.o. y/o male returns to my office with rectal/anal symptoms . Been treated for prostatitis and urology suggested a colonoscopy since his symptoms were not typical for prostatitis.   Plan  Colonoscopy   I have discussed alternative options, risks & benefits,  which include, but are not limited to, bleeding, infection, perforation,respiratory complication & drug reaction.  The patient agrees with this plan & written consent will be obtained.       Dr KJonathon Bellows MD,MRCP ([facility] Follow up in as needed

## 2021-12-14 NOTE — Addendum Note (Signed)
Addended by: Vanetta Mulders on: 12/14/2021 02:54 PM   Modules accepted: Orders

## 2021-12-15 ENCOUNTER — Other Ambulatory Visit
Admission: RE | Admit: 2021-12-15 | Discharge: 2021-12-15 | Disposition: A | Discharge status: Home / Self Care | Payer: Medicare Other | Attending: Urology | Admitting: Urology

## 2021-12-15 DIAGNOSIS — N410 Acute prostatitis: Secondary | ICD-10-CM | POA: Diagnosis present

## 2021-12-15 LAB — URINALYSIS, COMPLETE (UACMP) WITH MICROSCOPIC
Bilirubin Urine: NEGATIVE
Glucose, UA: 250 mg/dL — AB
Ketones, ur: NEGATIVE mg/dL
Leukocytes,Ua: NEGATIVE
Nitrite: NEGATIVE
Protein, ur: NEGATIVE mg/dL
Specific Gravity, Urine: 1.01 (ref 1.005–1.030)
pH: 5 (ref 5.0–8.0)

## 2021-12-15 LAB — CBC WITH DIFFERENTIAL/PLATELET
Abs Immature Granulocytes: 0.08 10*3/uL — ABNORMAL HIGH (ref 0.00–0.07)
Basophils Absolute: 0.1 10*3/uL (ref 0.0–0.1)
Basophils Relative: 1 %
Eosinophils Absolute: 0.1 10*3/uL (ref 0.0–0.5)
Eosinophils Relative: 1 %
HCT: 42 % (ref 39.0–52.0)
Hemoglobin: 14.7 g/dL (ref 13.0–17.0)
Immature Granulocytes: 1 %
Lymphocytes Relative: 17 %
Lymphs Abs: 1.5 10*3/uL (ref 0.7–4.0)
MCH: 32.2 pg (ref 26.0–34.0)
MCHC: 35 g/dL (ref 30.0–36.0)
MCV: 91.9 fL (ref 80.0–100.0)
Monocytes Absolute: 0.4 10*3/uL (ref 0.1–1.0)
Monocytes Relative: 5 %
Neutro Abs: 6.3 10*3/uL (ref 1.7–7.7)
Neutrophils Relative %: 75 %
Platelets: 196 10*3/uL (ref 150–400)
RBC: 4.57 MIL/uL (ref 4.22–5.81)
RDW: 13.5 % (ref 11.5–15.5)
WBC: 8.5 10*3/uL (ref 4.0–10.5)
nRBC: 0 % (ref 0.0–0.2)

## 2021-12-15 LAB — BASIC METABOLIC PANEL
Anion gap: 5 (ref 5–15)
BUN: 22 mg/dL (ref 8–23)
CO2: 27 mmol/L (ref 22–32)
Calcium: 8.8 mg/dL — ABNORMAL LOW (ref 8.9–10.3)
Chloride: 101 mmol/L (ref 98–111)
Creatinine, Ser: 1.06 mg/dL (ref 0.61–1.24)
GFR, Estimated: >60 mL/min (ref >60)
Glucose, Bld: 255 mg/dL — ABNORMAL HIGH (ref 70–99)
Potassium: 4.6 mmol/L (ref 3.5–5.1)
Sodium: 133 mmol/L — ABNORMAL LOW (ref 135–145)

## 2021-12-17 ENCOUNTER — Telehealth: Payer: Self-pay | Admitting: Gastroenterology

## 2021-12-17 ENCOUNTER — Ambulatory Visit: Payer: Medicare Other | Admitting: Anesthesiology

## 2021-12-17 ENCOUNTER — Encounter: Payer: Self-pay | Admitting: Gastroenterology

## 2021-12-17 ENCOUNTER — Other Ambulatory Visit: Payer: Self-pay

## 2021-12-17 ENCOUNTER — Telehealth: Payer: Self-pay | Admitting: Urology

## 2021-12-17 ENCOUNTER — Encounter
Admission: RE | Disposition: A | Discharge status: Home / Self Care | Payer: Self-pay | Source: Home / Self Care | Attending: Gastroenterology

## 2021-12-17 ENCOUNTER — Ambulatory Visit
Admission: RE | Admit: 2021-12-17 | Discharge: 2021-12-17 | Disposition: A | Discharge status: Home / Self Care | Payer: Medicare Other | Attending: Gastroenterology | Admitting: Gastroenterology

## 2021-12-17 DIAGNOSIS — I2510 Atherosclerotic heart disease of native coronary artery without angina pectoris: Secondary | ICD-10-CM | POA: Insufficient documentation

## 2021-12-17 DIAGNOSIS — K602 Anal fissure, unspecified: Secondary | ICD-10-CM | POA: Diagnosis not present

## 2021-12-17 DIAGNOSIS — E785 Hyperlipidemia, unspecified: Secondary | ICD-10-CM | POA: Insufficient documentation

## 2021-12-17 DIAGNOSIS — D126 Benign neoplasm of colon, unspecified: Secondary | ICD-10-CM | POA: Diagnosis not present

## 2021-12-17 DIAGNOSIS — Z87891 Personal history of nicotine dependence: Secondary | ICD-10-CM | POA: Insufficient documentation

## 2021-12-17 DIAGNOSIS — D122 Benign neoplasm of ascending colon: Secondary | ICD-10-CM | POA: Insufficient documentation

## 2021-12-17 DIAGNOSIS — Z79899 Other long term (current) drug therapy: Secondary | ICD-10-CM | POA: Diagnosis not present

## 2021-12-17 DIAGNOSIS — D123 Benign neoplasm of transverse colon: Secondary | ICD-10-CM | POA: Diagnosis not present

## 2021-12-17 DIAGNOSIS — K219 Gastro-esophageal reflux disease without esophagitis: Secondary | ICD-10-CM | POA: Insufficient documentation

## 2021-12-17 DIAGNOSIS — K6289 Other specified diseases of anus and rectum: Secondary | ICD-10-CM | POA: Diagnosis not present

## 2021-12-17 DIAGNOSIS — Z951 Presence of aortocoronary bypass graft: Secondary | ICD-10-CM | POA: Insufficient documentation

## 2021-12-17 DIAGNOSIS — E1151 Type 2 diabetes mellitus with diabetic peripheral angiopathy without gangrene: Secondary | ICD-10-CM | POA: Diagnosis not present

## 2021-12-17 DIAGNOSIS — I6529 Occlusion and stenosis of unspecified carotid artery: Secondary | ICD-10-CM | POA: Diagnosis not present

## 2021-12-17 DIAGNOSIS — I10 Essential (primary) hypertension: Secondary | ICD-10-CM | POA: Diagnosis not present

## 2021-12-17 DIAGNOSIS — I4891 Unspecified atrial fibrillation: Secondary | ICD-10-CM | POA: Diagnosis not present

## 2021-12-17 HISTORY — PX: COLONOSCOPY WITH PROPOFOL: SHX5780

## 2021-12-17 SURGERY — COLONOSCOPY WITH PROPOFOL
Anesthesia: General

## 2021-12-17 MED ORDER — PROPOFOL 10 MG/ML IV BOLUS
INTRAVENOUS | Status: DC | PRN
  Administered 2021-12-17: 30 mg via INTRAVENOUS
  Administered 2021-12-17: 40 mg via INTRAVENOUS
  Administered 2021-12-17: 100 mg via INTRAVENOUS
  Administered 2021-12-17 (×3): 40 mg via INTRAVENOUS

## 2021-12-17 MED ORDER — SODIUM CHLORIDE 0.9 % IV SOLN: INTRAVENOUS | Status: DC

## 2021-12-17 NOTE — Telephone Encounter (Signed)
Patient called stating Dr Vicente Males did his colonoscopy this morning and told the patient he was sending a prescription in for him. Patient states he called [facility] in [place] and they do not have anything for him.

## 2021-12-17 NOTE — Anesthesia Preprocedure Evaluation (Addendum)
Anesthesia Evaluation  Patient identified by MRN, date of birth, ID band Patient awake    Reviewed: Allergy & Precautions, NPO status , Patient's Chart, lab work & pertinent test results  History of Anesthesia Complications Negative for: history of anesthetic complications  Airway Mallampati: III   Neck ROM: Full    Dental  (+) Implants   Pulmonary former smoker (quit 1957),    Pulmonary exam normal breath sounds clear to auscultation       Cardiovascular hypertension, + CAD (s/p CABG 2016) and + Peripheral Vascular Disease (carotid stenosis)  Normal cardiovascular exam Rhythm:Regular Rate:Normal  ECG 09/07/21: Normal sinus rhythm rate 61 bpm no significant ST-T wave changes  Echo 03/2021:  1. Left ventricular ejection fraction, by estimation, is 55%. The left ventricle has normal function. The left ventricle has no regional wall motion abnormalities. Left ventricular diastolic parameters are consistent with Grade II diastolic dysfunction (pseudonormalization).  2. Right ventricular systolic function is normal. The right ventricular size is mildly enlarged.  3. Left atrial size was mildly dilated. Mild MR   Neuro/Psych negative neurological ROS     GI/Hepatic GERD  Medicated,  Endo/Other  negative endocrine ROS  Renal/GU negative Renal ROS     Musculoskeletal   Abdominal   Peds  Hematology negative hematology ROS (+)   Anesthesia Other Findings Cardiology note 09/07/21:  Atrial fibrillation with RVR ([place])  Maintaining normal sinus rhythm Noted postoperatively following CABG Denies symptoms concerning for paroxysmal tachycardia Continue low-dose metoprolol  Near syncope Normal ejection fraction November 2022  high PVC burden on monitor March 2022 Difficult to treat in the setting of bradycardia, Has been asymptomatic recently  Coronary artery disease involving native coronary artery of native heart  without angina pectoris -  Currently with no symptoms of angina. No further workup at this time. Continue current medication regimen. Recommend trying to get his A1c down, diet modification Recommend he hold the Plavix.  Continue aspirin 81 daily  PVD (peripheral vascular disease) ([facility]) Cholesterol is at goal on the current lipid regimen. No changes to the medications were made. No claudication  S/P CABG x 3 Currently with no symptoms of angina. No further workup at this time. Continue current medication regimen.  Type 2 diabetes with complications We have encouraged continued exercise, careful diet management in an effort to lose weight. 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  Hyperlipidemia At goal, no medication changes made  PVCs Frequent PVCs noted on monitor March 2022 Echocardiogram with normal ejection fraction  not a good candidate for antiarrhythmics in the setting of borderline low heart rate Currently asymptomatic, not on EKG today  Reproductive/Obstetrics                            Anesthesia Physical Anesthesia Plan  ASA: 2  Anesthesia Plan: General   Post-op Pain Management:    Induction: Intravenous  PONV Risk Score and Plan: 2 and Propofol infusion, TIVA and Treatment may vary due to age or medical condition  Airway Management Planned: Natural Airway  Additional Equipment:   Intra-op Plan:   Post-operative Plan:   Informed Consent: I have reviewed the patients History and Physical, chart, labs and discussed the procedure including the risks, benefits and alternatives for the proposed anesthesia with the patient or authorized representative who has indicated his/her understanding and acceptance.       Plan Discussed with: CRNA  Anesthesia Plan Comments: (  LMA/GETA backup discussed.  Patient consented for risks of anesthesia including but not limited to:  - adverse  reactions to medications - damage to eyes, teeth, lips or other oral mucosa - nerve damage due to positioning  - sore throat or hoarseness - damage to heart, brain, nerves, lungs, other parts of body or loss of life  Informed patient about role of CRNA in peri- and intra-operative care.  Patient voiced understanding.)        Anesthesia Quick Evaluation

## 2021-12-17 NOTE — Transfer of Care (Signed)
Immediate Anesthesia Transfer of Care Note  Patient: Crist Infante  Procedure(s) Performed: COLONOSCOPY WITH PROPOFOL  Patient Location: Endoscopy Unit  Anesthesia Type:General  Level of Consciousness: drowsy  Airway & Oxygen Therapy: Patient Spontanous Breathing and Patient connected to nasal cannula oxygen  Post-op Assessment: Report given to RN, Post -op Vital signs reviewed and stable and Patient moving all extremities  Post vital signs: Reviewed and stable  Last Vitals:  Vitals Value Taken Time  BP 106/71 12/17/21 1032  Temp    Pulse 91 12/17/21 1032  Resp 21 12/17/21 1032  SpO2 98 % 12/17/21 1032  Vitals shown include unvalidated device data.  Last Pain:  Vitals:   12/17/21 0922  TempSrc: Temporal  PainSc: 0-No pain         Complications: No notable events documented.

## 2021-12-17 NOTE — Anesthesia Postprocedure Evaluation (Signed)
Anesthesia Post Note  Patient: Crist Infante  Procedure(s) Performed: COLONOSCOPY WITH PROPOFOL  Patient location during evaluation: PACU Anesthesia Type: General Level of consciousness: awake and alert, oriented and patient cooperative Pain management: pain level controlled Vital Signs Assessment: post-procedure vital signs reviewed and stable Respiratory status: spontaneous breathing, nonlabored ventilation and respiratory function stable Cardiovascular status: blood pressure returned to baseline and stable Postop Assessment: adequate PO intake Anesthetic complications: no   No notable events documented.   Last Vitals:  Vitals:   12/17/21 1042 12/17/21 1052  BP: 122/80 139/80  Pulse:    Resp:    Temp:    SpO2:      Last Pain:  Vitals:   12/17/21 1052  TempSrc:   PainSc: 0-No pain                 Darrin Nipper

## 2021-12-17 NOTE — Op Note (Signed)
[facility] Gastroenterology Patient Name: Christopher [name] Procedure Date: 12/17/2021 10:03 AM MRN: [id number] Account #: [id number] Date of Birth: 18-Feb-1932 Admit Type: Outpatient Age: 88 Room: [facility] ENDO ROOM 3 Gender: Male Note Status: Finalized Instrument Name: Jasper Riling [id number] Procedure:             Colonoscopy Indications:           Rectal pain Providers:             [name] MD, MD Referring MD:          Sofie Hartigan (Referring MD) Medicines:             Monitored Anesthesia Care Complications:         No immediate complications. Procedure:             Pre-Anesthesia Assessment:                        - Prior to the procedure, a History and Physical was                         performed, and patient medications, allergies and                         sensitivities were reviewed. The patient's tolerance                         of previous anesthesia was reviewed.                        - The risks and benefits of the procedure and the                         sedation options and risks were discussed with the                         patient. All questions were answered and informed                         consent was obtained.                        - ASA Grade Assessment: II - A patient with mild                         systemic disease.                        After obtaining informed consent, the colonoscope was                         passed under direct vision. Throughout the procedure,                         the patient's blood pressure, pulse, and oxygen                         saturations were monitored continuously. The                         Colonoscope was introduced through the anus  and                         advanced to the the cecum, identified by the                         appendiceal orifice. The colonoscopy was performed                         with ease. The patient tolerated the procedure well.                         The quality  of the bowel preparation was excellent. Findings:      Three sessile polyps were found in the ascending colon. The polyps were       4 to 6 mm in size. These polyps were removed with a cold snare.       Resection and retrieval were complete.      Two sessile polyps were found in the transverse colon. The polyps were 4       to 5 mm in size. These polyps were removed with a cold snare. Resection       and retrieval were complete.      A 10 mm polyp was found in the transverse colon. The polyp was       semi-pedunculated. The polyp was removed with a cold snare. Resection       and retrieval were complete.      The digital rectal exam findings include anal fissure.      The exam was otherwise without abnormality on direct and retroflexion       views. Impression:            - Three 4 to 6 mm polyps in the ascending colon,                         removed with a cold snare. Resected and retrieved.                        - Two 4 to 5 mm polyps in the transverse colon,                         removed with a cold snare. Resected and retrieved.                        - One 10 mm polyp in the transverse colon, removed                         with a cold snare. Resected and retrieved.                        - Anal fissure found on digital rectal exam.                        - The examination was otherwise normal on direct and                         retroflexion views. Recommendation:        - Discharge patient to home (with escort).                        -  Resume previous diet.                        - Continue present medications.                        - Await pathology results.                        - Anal pain from anal fissure we will send in a course                         of topical therapy for 2 weeks and if it fails may                         need botox injection                        - Return to my office in 4 weeks. Procedure Code(s):     --- Professional ---                         [phone], Colonoscopy, flexible; with removal of                         tumor(s), polyp(s), or other lesion(s) by snare                         technique Diagnosis Code(s):     --- Professional ---                        K63.5, Polyp of colon                        K60.2, Anal fissure, unspecified                        K62.89, Other specified diseases of anus and rectum CPT copyright 2019 [employer] Association. All rights reserved. The codes documented in this report are preliminary and upon coder review may  be revised to meet current compliance requirements. [name], MD [name] MD, MD 12/17/2021 10:33:06 AM This report has been signed electronically. Number of Addenda: 0 Note Initiated On: 12/17/2021 10:03 AM Scope Withdrawal Time: 0 hours 13 minutes 59 seconds  Total Procedure Duration: 0 hours 18 minutes 3 seconds  Estimated Blood Loss:  Estimated blood loss: none.      [facility]

## 2021-12-17 NOTE — H&P (Signed)
Christopher Bellows, MD [address], [place], [place], [place], [address] [id number], [place], [place], [place], [address] Phone: [phone]  Fax: [phone]  Primary Care Physician:  Sofie Hartigan, MD   Pre-Procedure History & Physical: HPI:  Christopher [name] is a 88 y.o. male is here for an colonoscopy.   Past Medical History:  Diagnosis Date   Carotid artery stenosis    a. RICA 40%   Coronary artery disease    a. PCI/DES to mLAD and dRCA in 2005, residual OM1 50%; b. nuclear stress test 02/2008: no ischemia; c. nuclear stress test 11/2009: mild ischemia in the inferior wall c/w previous stress test; d. cath 09/17/2014: critical ostial/prox LAD estimated at 90-95%, critical prox LCx, RCA w/ mild diff dz, EF >55%, no WMA   GERD (gastroesophageal reflux disease)    Hyperlipidemia    Hypertension    PVD (peripheral vascular disease) ([place])     Past Surgical History:  Procedure Laterality Date   CARDIAC CATHETERIZATION     CHOLECYSTECTOMY  2016   CORONARY ARTERY BYPASS GRAFT N/A 09/20/2014   Procedure: CORONARY ARTERY BYPASS GRAFTING (CABG) x   three using left internal mammary artery and right leg greater saphenous vein harvested endoscopically;  Surgeon: Melrose Nakayama, MD;  Location: [place];  Service: Open Heart Surgery;  Laterality: N/A;   TEE WITHOUT CARDIOVERSION N/A 09/20/2014   Procedure: TRANSESOPHAGEAL ECHOCARDIOGRAM (TEE);  Surgeon: Melrose Nakayama, MD;  Location: [facility];  Service: Open Heart Surgery;  Laterality: N/A;   TOTAL KNEE ARTHROPLASTY     bilateral    Prior to Admission medications   Medication Sig Start Date End Date Taking? Authorizing Provider  aspirin 81 MG EC tablet Take 81 mg by mouth daily.   Yes [provider]  cholecalciferol (VITAMIN D3) 25 MCG (1000 UNIT) tablet Take 1,000 Units by mouth daily.   Yes [provider]  lisinopril (ZESTRIL) 2.5 MG tablet Take 2.5 mg by mouth daily. 12/25/20  Yes [provider]  Magnesium 250 MG TABS Take by mouth daily.   Yes [provider]  metFORMIN (GLUCOPHAGE-XR) 500 MG 24 hr tablet Take 500 mg by mouth in the morning and at bedtime. 02/07/21  Yes [provider]  metoprolol tartrate (LOPRESSOR) 25 MG tablet Take 0.5 tablets (12.5 mg total) by mouth 2 (two) times daily. 04/27/21  Yes Gollan, Kathlene November, MD  omeprazole (PRILOSEC) 20 MG capsule Take 1 tablet by mouth daily. 07/03/20  Yes [provider]  rosuvastatin (CRESTOR) 5 MG tablet Take 5 mg by mouth daily. 06/06/20  Yes [provider]  celecoxib (CELEBREX) 200 MG capsule Take 200 mg by mouth daily. Patient not taking: Reported on 12/17/2021 01/19/21   [provider]  GABAPENTIN PO Take by mouth.    [provider]  lidocaine (XYLOCAINE) 2 % solution Use as directed 15 mLs in the mouth or throat as needed (Anal pain). Patient not taking: Reported on 12/14/2021 12/02/21   Nance Pear, MD    Allergies as of 12/14/2021   (No Known Allergies)    Family History  Problem Relation Age of Onset   Heart failure Mother    Prostate cancer Neg Hx    Bladder Cancer Neg Hx    Kidney cancer Neg Hx     Social History   Socioeconomic History   Marital status: Married    Spouse name: Not on file   Number of children: Not on  file   Years of education: Not on file   Highest education level: Not on file  Occupational History   Occupation: self employed  Tobacco Use   Smoking status: Former    Types: Cigarettes    Quit date: 09/14/1955    Years since quitting: 66.3   Smokeless tobacco: Never  Vaping Use   Vaping Use: Never used  Substance and Sexual Activity   Alcohol use: No   Drug use: No   Sexual activity: Not on file  Other Topics Concern   Not on file  Social History Narrative   Not on file   Social Determinants of Health   Financial Resource Strain: Not on file  Food Insecurity: Not on file  Transportation Needs: Not on file  Physical  Activity: Not on file  Stress: Not on file  Social Connections: Not on file  Intimate Partner Violence: Not on file    Review of Systems: See HPI, otherwise negative ROS  Physical Exam: BP (!) 178/98   Pulse 99   Temp (!) 97.1 F (36.2 C) (Temporal)   Resp 18   Ht '5\' 11"'$  (1.803 m)   Wt 88.3 kg   SpO2 100%   BMI 27.14 kg/m  General:   Alert,  pleasant and cooperative in NAD Head:  Normocephalic and atraumatic. Neck:  Supple; no masses or thyromegaly. Lungs:  Clear throughout to auscultation, normal respiratory effort.    Heart:  +S1, +S2, Regular rate and rhythm, No edema. Abdomen:  Soft, nontender and nondistended. Normal bowel sounds, without guarding, and without rebound.   Neurologic:  Alert and  oriented x4;  grossly normal neurologically.  Impression/Plan: Christopher [name] is here for an colonoscopy to be performed for rectal pain Risks, benefits, limitations, and alternatives regarding  colonoscopy have been reviewed with the patient.  Questions have been answered.  All parties agreeable.   Christopher Bellows, MD  12/17/2021, 9:27 AM

## 2021-12-17 NOTE — Telephone Encounter (Signed)
Pt aware.

## 2021-12-17 NOTE — Telephone Encounter (Signed)
Patient's daughter Olivia Mackie called and asked if he still needed to keep his if/up appt tomorrow. She stated that he had his colonoscopy done and that they said his symptoms were not urological related and the ABX have taken care of his infection that he had. He had some labs done earlier in the week and she wanted to know if they could just be called with those results. He is feeling a lot better  Thanks, [employer]

## 2021-12-17 NOTE — Telephone Encounter (Signed)
I saw colonoscopy results, agree pain from anal fissure.    Labs are normalized  F/u as needed.  OK to cancel tomorrow.  Hollice Espy, MD

## 2021-12-18 ENCOUNTER — Ambulatory Visit: Payer: Federal, State, Local not specified - PPO | Admitting: Urology

## 2021-12-18 ENCOUNTER — Encounter: Payer: Self-pay | Admitting: Gastroenterology

## 2021-12-18 LAB — SURGICAL PATHOLOGY

## 2021-12-20 ENCOUNTER — Emergency Department: Payer: Medicare Other

## 2021-12-20 ENCOUNTER — Inpatient Hospital Stay: Payer: Medicare Other

## 2021-12-20 ENCOUNTER — Encounter: Payer: Self-pay | Admitting: Emergency Medicine

## 2021-12-20 ENCOUNTER — Other Ambulatory Visit: Payer: Self-pay

## 2021-12-20 ENCOUNTER — Observation Stay
Admission: EM | Admit: 2021-12-20 | Discharge: 2021-12-21 | Disposition: A | Discharge status: Home / Self Care | Payer: Medicare Other | Attending: Internal Medicine | Admitting: Internal Medicine

## 2021-12-20 DIAGNOSIS — R0989 Other specified symptoms and signs involving the circulatory and respiratory systems: Secondary | ICD-10-CM | POA: Diagnosis not present

## 2021-12-20 DIAGNOSIS — M47812 Spondylosis without myelopathy or radiculopathy, cervical region: Secondary | ICD-10-CM | POA: Diagnosis not present

## 2021-12-20 DIAGNOSIS — Z96653 Presence of artificial knee joint, bilateral: Secondary | ICD-10-CM | POA: Insufficient documentation

## 2021-12-20 DIAGNOSIS — I700 Atherosclerosis of aorta: Secondary | ICD-10-CM | POA: Diagnosis not present

## 2021-12-20 DIAGNOSIS — Z86010 Personal history of colonic polyps: Secondary | ICD-10-CM | POA: Diagnosis not present

## 2021-12-20 DIAGNOSIS — K219 Gastro-esophageal reflux disease without esophagitis: Secondary | ICD-10-CM | POA: Diagnosis not present

## 2021-12-20 DIAGNOSIS — I6782 Cerebral ischemia: Secondary | ICD-10-CM | POA: Insufficient documentation

## 2021-12-20 DIAGNOSIS — I252 Old myocardial infarction: Secondary | ICD-10-CM | POA: Diagnosis not present

## 2021-12-20 DIAGNOSIS — R262 Difficulty in walking, not elsewhere classified: Secondary | ICD-10-CM | POA: Insufficient documentation

## 2021-12-20 DIAGNOSIS — Z8719 Personal history of other diseases of the digestive system: Secondary | ICD-10-CM | POA: Insufficient documentation

## 2021-12-20 DIAGNOSIS — Z87891 Personal history of nicotine dependence: Secondary | ICD-10-CM | POA: Diagnosis not present

## 2021-12-20 DIAGNOSIS — G459 Transient cerebral ischemic attack, unspecified: Secondary | ICD-10-CM

## 2021-12-20 DIAGNOSIS — D696 Thrombocytopenia, unspecified: Secondary | ICD-10-CM | POA: Insufficient documentation

## 2021-12-20 DIAGNOSIS — Z951 Presence of aortocoronary bypass graft: Secondary | ICD-10-CM | POA: Insufficient documentation

## 2021-12-20 DIAGNOSIS — E785 Hyperlipidemia, unspecified: Secondary | ICD-10-CM | POA: Insufficient documentation

## 2021-12-20 DIAGNOSIS — Z955 Presence of coronary angioplasty implant and graft: Secondary | ICD-10-CM | POA: Diagnosis not present

## 2021-12-20 DIAGNOSIS — E1165 Type 2 diabetes mellitus with hyperglycemia: Secondary | ICD-10-CM | POA: Insufficient documentation

## 2021-12-20 DIAGNOSIS — Z79899 Other long term (current) drug therapy: Secondary | ICD-10-CM | POA: Insufficient documentation

## 2021-12-20 DIAGNOSIS — E1142 Type 2 diabetes mellitus with diabetic polyneuropathy: Secondary | ICD-10-CM | POA: Insufficient documentation

## 2021-12-20 DIAGNOSIS — I6523 Occlusion and stenosis of bilateral carotid arteries: Secondary | ICD-10-CM | POA: Insufficient documentation

## 2021-12-20 DIAGNOSIS — Z7984 Long term (current) use of oral hypoglycemic drugs: Secondary | ICD-10-CM | POA: Insufficient documentation

## 2021-12-20 DIAGNOSIS — I10 Essential (primary) hypertension: Secondary | ICD-10-CM | POA: Insufficient documentation

## 2021-12-20 DIAGNOSIS — R42 Dizziness and giddiness: Principal | ICD-10-CM | POA: Insufficient documentation

## 2021-12-20 DIAGNOSIS — I4891 Unspecified atrial fibrillation: Secondary | ICD-10-CM | POA: Insufficient documentation

## 2021-12-20 DIAGNOSIS — I471 Supraventricular tachycardia: Secondary | ICD-10-CM | POA: Diagnosis not present

## 2021-12-20 DIAGNOSIS — I480 Paroxysmal atrial fibrillation: Secondary | ICD-10-CM | POA: Insufficient documentation

## 2021-12-20 DIAGNOSIS — N400 Enlarged prostate without lower urinary tract symptoms: Secondary | ICD-10-CM | POA: Insufficient documentation

## 2021-12-20 DIAGNOSIS — R2990 Unspecified symptoms and signs involving the nervous system: Secondary | ICD-10-CM | POA: Insufficient documentation

## 2021-12-20 DIAGNOSIS — Z7982 Long term (current) use of aspirin: Secondary | ICD-10-CM | POA: Diagnosis not present

## 2021-12-20 DIAGNOSIS — E049 Nontoxic goiter, unspecified: Secondary | ICD-10-CM | POA: Insufficient documentation

## 2021-12-20 DIAGNOSIS — E1151 Type 2 diabetes mellitus with diabetic peripheral angiopathy without gangrene: Secondary | ICD-10-CM | POA: Diagnosis not present

## 2021-12-20 DIAGNOSIS — I491 Atrial premature depolarization: Secondary | ICD-10-CM | POA: Insufficient documentation

## 2021-12-20 DIAGNOSIS — I2510 Atherosclerotic heart disease of native coronary artery without angina pectoris: Secondary | ICD-10-CM | POA: Insufficient documentation

## 2021-12-20 DIAGNOSIS — Z8249 Family history of ischemic heart disease and other diseases of the circulatory system: Secondary | ICD-10-CM | POA: Insufficient documentation

## 2021-12-20 DIAGNOSIS — I6789 Other cerebrovascular disease: Secondary | ICD-10-CM | POA: Insufficient documentation

## 2021-12-20 DIAGNOSIS — F40240 Claustrophobia: Secondary | ICD-10-CM | POA: Insufficient documentation

## 2021-12-20 DIAGNOSIS — R471 Dysarthria and anarthria: Secondary | ICD-10-CM | POA: Insufficient documentation

## 2021-12-20 DIAGNOSIS — R008 Other abnormalities of heart beat: Secondary | ICD-10-CM | POA: Insufficient documentation

## 2021-12-20 DIAGNOSIS — I493 Ventricular premature depolarization: Secondary | ICD-10-CM | POA: Insufficient documentation

## 2021-12-20 DIAGNOSIS — R9431 Abnormal electrocardiogram [ECG] [EKG]: Secondary | ICD-10-CM | POA: Insufficient documentation

## 2021-12-20 DIAGNOSIS — G319 Degenerative disease of nervous system, unspecified: Secondary | ICD-10-CM | POA: Insufficient documentation

## 2021-12-20 DIAGNOSIS — E119 Type 2 diabetes mellitus without complications: Secondary | ICD-10-CM

## 2021-12-20 HISTORY — DX: Supraventricular tachycardia, unspecified: I47.10

## 2021-12-20 HISTORY — DX: Unspecified atrial fibrillation: I48.91

## 2021-12-20 HISTORY — DX: Other ill-defined heart diseases: I51.89

## 2021-12-20 HISTORY — DX: Supraventricular tachycardia: I47.1

## 2021-12-20 HISTORY — DX: Ventricular premature depolarization: I49.3

## 2021-12-20 LAB — BASIC METABOLIC PANEL
Anion gap: 6 (ref 5–15)
BUN: 16 mg/dL (ref 8–23)
CO2: 24 mmol/L (ref 22–32)
Calcium: 8.2 mg/dL — ABNORMAL LOW (ref 8.9–10.3)
Chloride: 108 mmol/L (ref 98–111)
Creatinine, Ser: 1.05 mg/dL (ref 0.61–1.24)
GFR, Estimated: >60 mL/min (ref >60)
Glucose, Bld: 181 mg/dL — ABNORMAL HIGH (ref 70–99)
Potassium: 4 mmol/L (ref 3.5–5.1)
Sodium: 138 mmol/L (ref 135–145)

## 2021-12-20 LAB — CBC
HCT: 39.6 % (ref 39.0–52.0)
Hemoglobin: 13.4 g/dL (ref 13.0–17.0)
MCH: 31.2 pg (ref 26.0–34.0)
MCHC: 33.8 g/dL (ref 30.0–36.0)
MCV: 92.1 fL (ref 80.0–100.0)
Platelets: 103 10*3/uL — ABNORMAL LOW (ref 150–400)
RBC: 4.3 MIL/uL (ref 4.22–5.81)
RDW: 13.7 % (ref 11.5–15.5)
WBC: 7.4 10*3/uL (ref 4.0–10.5)
nRBC: 0 % (ref 0.0–0.2)

## 2021-12-20 LAB — URINALYSIS, ROUTINE W REFLEX MICROSCOPIC
Bilirubin Urine: NEGATIVE
Glucose, UA: 50 mg/dL — AB
Ketones, ur: NEGATIVE mg/dL
Nitrite: NEGATIVE
Protein, ur: NEGATIVE mg/dL
Specific Gravity, Urine: 1.004 — ABNORMAL LOW (ref 1.005–1.030)
WBC, UA: NONE SEEN WBC/hpf (ref 0–5)
pH: 6 (ref 5.0–8.0)

## 2021-12-20 LAB — HEMOGLOBIN A1C
Hgb A1c MFr Bld: 7.9 % — ABNORMAL HIGH (ref 4.8–5.6)
Mean Plasma Glucose: 180.03 mg/dL

## 2021-12-20 MED ORDER — LORAZEPAM 2 MG/ML IJ SOLN
0.5000 mg | Freq: Once | INTRAMUSCULAR | Status: AC
  Administered 2021-12-20: 0.5 mg via INTRAVENOUS
  Filled 2021-12-20: qty 1

## 2021-12-20 MED ORDER — IOHEXOL 350 MG/ML SOLN
75.0000 mL | Freq: Once | INTRAVENOUS | Status: AC | PRN
  Administered 2021-12-20: 75 mL via INTRAVENOUS

## 2021-12-20 MED ORDER — ROSUVASTATIN CALCIUM 10 MG PO TABS
5.0000 mg | ORAL_TABLET | Freq: Every day | ORAL | Status: DC
Start: 2021-12-21 — End: 2021-12-21
  Administered 2021-12-21: 5 mg via ORAL
  Filled 2021-12-20: qty 1

## 2021-12-20 MED ORDER — ASPIRIN 81 MG PO TBEC
81.0000 mg | DELAYED_RELEASE_TABLET | Freq: Every day | ORAL | Status: DC
  Administered 2021-12-21: 81 mg via ORAL
  Filled 2021-12-20: qty 1

## 2021-12-20 MED ORDER — PANTOPRAZOLE SODIUM 40 MG PO TBEC
40.0000 mg | DELAYED_RELEASE_TABLET | Freq: Every day | ORAL | Status: DC
  Administered 2021-12-20 – 2021-12-21 (×2): 40 mg via ORAL
  Filled 2021-12-20 (×2): qty 1

## 2021-12-20 MED ORDER — MELATONIN 5 MG PO TABS
5.0000 mg | ORAL_TABLET | Freq: Every evening | ORAL | Status: DC | PRN
  Administered 2021-12-20: 5 mg via ORAL
  Filled 2021-12-20: qty 1

## 2021-12-20 MED ORDER — GABAPENTIN 100 MG PO CAPS
100.0000 mg | ORAL_CAPSULE | Freq: Three times a day (TID) | ORAL | Status: DC
  Administered 2021-12-20 – 2021-12-21 (×3): 100 mg via ORAL
  Filled 2021-12-20 (×3): qty 1

## 2021-12-20 MED ORDER — ENOXAPARIN SODIUM 40 MG/0.4ML IJ SOSY
40.0000 mg | PREFILLED_SYRINGE | INTRAMUSCULAR | Status: DC
Start: 2021-12-20 — End: 2021-12-21
  Administered 2021-12-20 – 2021-12-21 (×2): 40 mg via SUBCUTANEOUS
  Filled 2021-12-20 (×2): qty 0.4

## 2021-12-20 MED ORDER — ACETAMINOPHEN 325 MG PO TABS
650.0000 mg | ORAL_TABLET | Freq: Four times a day (QID) | ORAL | Status: DC | PRN
  Administered 2021-12-21: 650 mg via ORAL
  Filled 2021-12-20: qty 2

## 2021-12-20 MED ORDER — ASPIRIN 81 MG PO CHEW
324.0000 mg | CHEWABLE_TABLET | Freq: Once | ORAL | Status: AC
  Administered 2021-12-20: 324 mg via ORAL
  Filled 2021-12-20: qty 4

## 2021-12-20 MED ORDER — LABETALOL HCL 5 MG/ML IV SOLN: 5.0000 mg | INTRAVENOUS | Status: DC | PRN

## 2021-12-20 MED ORDER — POLYETHYLENE GLYCOL 3350 17 G PO PACK: 17.0000 g | PACK | Freq: Every day | ORAL | Status: DC | PRN

## 2021-12-20 NOTE — ED Provider Notes (Signed)
[facility] Provider Note    Event Date/Time   First MD Initiated Contact with Patient 12/20/21 1000     (approximate)   History   Dizziness   HPI  Christopher [name] is a 88 y.o. male with past medical history of atrial fibrillation, prior NSTEMI, hypertension, atherosclerosis, here with transient dizziness.  The patient states that earlier this morning, he developed acute, severe, vertigo when speaking to his wife.  He had felt well prior to the onset of symptoms.  He states he suddenly felt like the room was spinning and like he could not walk.  Denies any other associated symptoms.  No difficulty speaking or swallowing.  No focal numbness or weakness.  Symptoms then resolved but have since come back and resolved several times.  He now feels weak but otherwise back to his baseline.  Patient has a reported history of A-fib, though he is not on anticoagulation.  This reportedly was after cardiac surgery.  He does not take anticoagulants.  Is otherwise been well.  He does state that he had a colonoscopy several days ago, but had otherwise been actually feeling pretty well after the colonoscopy.     Physical Exam   Triage Vital Signs: ED Triage Vitals  Enc Vitals Group     BP 12/20/21 0940 (!) 181/89     Pulse Rate 12/20/21 0940 72     Resp 12/20/21 0940 18     Temp 12/20/21 0940 98.1 F (36.7 C)     Temp Source 12/20/21 0940 Oral     SpO2 12/20/21 0934 100 %     Weight 12/20/21 0938 198 lb (89.8 kg)     Height 12/20/21 0938 '5\' 11"'$  (1.803 m)     Head Circumference --      Peak Flow --      Pain Score 12/20/21 0938 0     Pain Loc --      Pain Edu? --      Excl. in [place]? --     Most recent vital signs: Vitals:   12/20/21 0940 12/20/21 1351  BP: (!) 181/89 (!) 150/94  Pulse: 72 (!) 106  Resp: 18 18  Temp: 98.1 F (36.7 C) 98.3 F (36.8 C)  SpO2: 100% 100%     General: Awake, no distress.  CV:  Good peripheral perfusion.  Regular rate and  rhythm. Resp:  Normal effort.  Lungs clear to auscultation bilaterally. Abd:  No distention.  No tenderness. Other:  Cranial nerves II through XII intact.  Normal strength and sensation bilateral upper extremities.  Some very slight dysmetria noted with finger-to-nose testing on the left.   ED Results / Procedures / Treatments   Labs (all labs ordered are listed, but only abnormal results are displayed) Labs Reviewed  BASIC METABOLIC PANEL - Abnormal; Notable for the following components:      Result Value   Glucose, Bld 181 (*)    Calcium 8.2 (*)    All other components within normal limits  CBC - Abnormal; Notable for the following components:   Platelets 103 (*)    All other components within normal limits  URINALYSIS, ROUTINE W REFLEX MICROSCOPIC - Abnormal; Notable for the following components:   Color, Urine STRAW (*)    APPearance CLEAR (*)    Specific Gravity, Urine 1.004 (*)    Glucose, UA 50 (*)    Hgb urine dipstick SMALL (*)    Leukocytes,Ua SMALL (*)    Bacteria,  UA RARE (*)    All other components within normal limits  HEMOGLOBIN A1C  CBG MONITORING, ED     EKG Normal sinus rhythm, ventricular rate 90.  PR 190, QRS 111, QTc 438.  No acute ST elevations or depressions.   RADIOLOGY CT angio head/neck: Diffuse small atherosclerotic disease   I also independently reviewed and agree with radiologist interpretations.   PROCEDURES:  Critical Care performed: No  .1-3 Lead EKG Interpretation  Performed by: Duffy Bruce, MD Authorized by: Duffy Bruce, MD     Interpretation: non-specific     ECG rate:  80-110   ECG rate assessment: tachycardic     Rhythm: sinus rhythm     Ectopy: none     Conduction: normal   Comments:     Indication: TIA/CVA   MEDICATIONS ORDERED IN ED: Medications  enoxaparin (LOVENOX) injection 40 mg (40 mg Subcutaneous Given 12/20/21 1342)  pantoprazole (PROTONIX) EC tablet 40 mg (40 mg Oral Given 12/20/21 1341)   rosuvastatin (CRESTOR) tablet 5 mg (has no administration in time range)  labetalol (NORMODYNE) injection 5 mg (has no administration in time range)  aspirin EC tablet 81 mg (has no administration in time range)  gabapentin (NEURONTIN) capsule 100 mg (has no administration in time range)  iohexol (OMNIPAQUE) 350 MG/ML injection 75 mL (75 mLs Intravenous Contrast Given 12/20/21 1104)  aspirin chewable tablet 324 mg (324 mg Oral Given 12/20/21 1341)  LORazepam (ATIVAN) injection 0.5 mg (0.5 mg Intravenous Given 12/20/21 1411)     IMPRESSION / MDM / ASSESSMENT AND PLAN / ED COURSE  I reviewed the triage vital signs and the nursing notes.                               The patient is on the cardiac monitor to evaluate for evidence of arrhythmia and/or significant heart rate changes.   Ddx:  Differential includes the following, with pertinent life- or limb-threatening emergencies considered:  TIA, CVA, peripheral vertigo, transient hypoperfusion related to arrhythmia, A-fib  Patient's presentation is most consistent with acute presentation with potential threat to life or bodily function.  MDM:  88 year old male with extensive cardiovascular history as well as history of A-fib here with transient vertigo.  Primary concern is possible TIA versus CVA.  He has a history of A-fib but is not on anticoagulation.  He also recently had a colonoscopy which would place him at increased risk.  Otherwise, patient is stable currently.  No ongoing deficits, so do not feel he meets code stroke criteria.  CT angio shows diffuse small vessel disease but is otherwise fairly unremarkable. lab work shows normal white count.  BMP is unremarkable.  Glucose 181.  Urinalysis shows no significant UTI.  CT scan reviewed, shows small vessel disease, no evidence of LVO.  EKG shows sinus rhythm.  Nonspecific changes noted.  The patient does have intermittent episodes of PACs, though no overt A-fib noted on telemetry.   Discussed with Dr. Quinn Axe of neurology.  Will admit for TIA/CVA work-up.  Patient updated and is in agreement.  Full dose aspirin given.    MEDICATIONS GIVEN IN ED: Medications  enoxaparin (LOVENOX) injection 40 mg (40 mg Subcutaneous Given 12/20/21 1342)  pantoprazole (PROTONIX) EC tablet 40 mg (40 mg Oral Given 12/20/21 1341)  rosuvastatin (CRESTOR) tablet 5 mg (has no administration in time range)  labetalol (NORMODYNE) injection 5 mg (has no administration in time range)  aspirin EC  tablet 81 mg (has no administration in time range)  gabapentin (NEURONTIN) capsule 100 mg (has no administration in time range)  iohexol (OMNIPAQUE) 350 MG/ML injection 75 mL (75 mLs Intravenous Contrast Given 12/20/21 1104)  aspirin chewable tablet 324 mg (324 mg Oral Given 12/20/21 1341)  LORazepam (ATIVAN) injection 0.5 mg (0.5 mg Intravenous Given 12/20/21 1411)     Consults:  Dr. Quinn Axe, neurology   EMR reviewed Dr. Rockey Situ cardiology notes 4/23, no mention of anticoagulant use     FINAL CLINICAL IMPRESSION(S) / ED DIAGNOSES   Final diagnoses:  Stroke-like symptoms     Rx / DC Orders   ED Discharge Orders     None        Note:  This document was prepared using Dragon voice recognition software and may include unintentional dictation errors.   Duffy Bruce, MD 12/20/21 1534

## 2021-12-20 NOTE — H&P (Addendum)
History and Physical  Christopher [name] NGE:[id number] DOB: 1932/02/28 DOA: 12/20/2021  Referring physician: Dr. Ellender Hose, EDP  PCP: Sofie Hartigan, MD  Outpatient Specialists: Cardiology, Dr. Rockey Situ, GI, Dr. Vicente Males. Patient coming from: Home  Chief Complaint: Dizziness.   HPI: Christopher [name] is a 88 y.o. male with medical history significant for paroxysmal A-fib not anticoagulated, coronary artery disease status post CABG and PCI with stent placement, currently on antiplatelet monotherapy aspirin 81 mg daily, recent colonoscopy on 12/17/2021 with polyps removed, treated anal fissure, essential hypertension, hyperlipidemia, type 2 diabetes, diabetic polyneuropathy, GERD, peripheral vascular disease with mild to moderate carotid artery disease who presented to [facility] ED from home due to sudden onset dizziness around 7:30 AM.  No falls.  Dizziness has now resolved in the ED however the patient has left-sided dysarthria on exam.  The patient stopped his home baby aspirin 81 mg daily, 2 days prior to his colonoscopy, restarted it yesterday evening.  EDP discussed the case with neurology who recommended admission for TIA versus stroke work-up.  The patient was admitted by [facility], hospitalist service.  ED Course: Tmax 98.1.  BP 181/89, pulse 72, respiratory rate 18, O2 saturation 100% on room air.  Lab studies remarkable for serum glucose 181.  Platelet count 103, platelet count 196, 5 days ago.  Review of Systems: Review of systems as noted in the HPI. All other systems reviewed and are negative.   Past Medical History:  Diagnosis Date   Carotid artery stenosis    a. RICA 40%   Coronary artery disease    a. PCI/DES to mLAD and dRCA in 2005, residual OM1 50%; b. nuclear stress test 02/2008: no ischemia; c. nuclear stress test 11/2009: mild ischemia in the inferior wall c/w previous stress test; d. cath 09/17/2014: critical ostial/prox LAD estimated at 90-95%, critical prox LCx, RCA w/ mild diff dz, EF >55%, no WMA    GERD (gastroesophageal reflux disease)    Hyperlipidemia    Hypertension    PVD (peripheral vascular disease) ([place])    Past Surgical History:  Procedure Laterality Date   CARDIAC CATHETERIZATION     CHOLECYSTECTOMY  2016   COLONOSCOPY WITH PROPOFOL N/A 12/17/2021   Procedure: COLONOSCOPY WITH PROPOFOL;  Surgeon: [name], MD;  Location: [facility] ENDOSCOPY;  Service: Gastroenterology;  Laterality: N/A;   CORONARY ARTERY BYPASS GRAFT N/A 09/20/2014   Procedure: CORONARY ARTERY BYPASS GRAFTING (CABG) x   three using left internal mammary artery and right leg greater saphenous vein harvested endoscopically;  Surgeon: Melrose Nakayama, MD;  Location: [place];  Service: Open Heart Surgery;  Laterality: N/A;   TEE WITHOUT CARDIOVERSION N/A 09/20/2014   Procedure: TRANSESOPHAGEAL ECHOCARDIOGRAM (TEE);  Surgeon: Melrose Nakayama, MD;  Location: [place];  Service: Open Heart Surgery;  Laterality: N/A;   TOTAL KNEE ARTHROPLASTY     bilateral    Social History:  reports that he quit smoking about 66 years ago. His smoking use included cigarettes. He has never used smokeless tobacco. He reports that he does not drink alcohol and does not use drugs.   No Known Allergies  Family History  Problem Relation Age of Onset   Heart failure Mother    Prostate cancer Neg Hx    Bladder Cancer Neg Hx    Kidney cancer Neg Hx       Prior to Admission medications   Medication Sig Start Date End Date Taking? Authorizing Provider  aspirin 81 MG EC tablet Take 81 mg by mouth  daily.    [provider]  celecoxib (CELEBREX) 200 MG capsule Take 200 mg by mouth daily. Patient not taking: Reported on 12/17/2021 01/19/21   [provider]  cholecalciferol (VITAMIN D3) 25 MCG (1000 UNIT) tablet Take 1,000 Units by mouth daily.    [provider]  GABAPENTIN PO Take by mouth.    [provider]  lidocaine (XYLOCAINE) 2 % solution Use as directed 15 mLs in the mouth or throat as  needed (Anal pain). Patient not taking: Reported on 12/14/2021 12/02/21   Nance Pear, MD  lisinopril (ZESTRIL) 2.5 MG tablet Take 2.5 mg by mouth daily. 12/25/20   [provider]  Magnesium 250 MG TABS Take by mouth daily.    [provider]  metFORMIN (GLUCOPHAGE-XR) 500 MG 24 hr tablet Take 500 mg by mouth in the morning and at bedtime. 02/07/21   [provider]  metoprolol tartrate (LOPRESSOR) 25 MG tablet Take 0.5 tablets (12.5 mg total) by mouth 2 (two) times daily. 04/27/21   Minna Merritts, MD  omeprazole (PRILOSEC) 20 MG capsule Take 1 tablet by mouth daily. 07/03/20   [provider]  rosuvastatin (CRESTOR) 5 MG tablet Take 5 mg by mouth daily. 06/06/20   [provider]    Physical Exam: BP (!) 181/89 (BP Location: Left Arm)   Pulse 72   Temp 98.1 F (36.7 C) (Oral)   Resp 18   Ht '5\' 11"'$  (1.803 m)   Wt 89.8 kg   SpO2 100%   BMI 27.62 kg/m   General: 88 y.o. year-old male well developed well nourished in no acute distress.  Alert and oriented x3. Cardiovascular: Tachycardic with no rubs or gallops.  No thyromegaly or JVD noted.  No lower extremity edema. 2/4 pulses in all 4 extremities. Respiratory: Clear to auscultation with no wheezes or rales. Good inspiratory effort. Abdomen: Soft nontender nondistended with normal bowel sounds x4 quadrants. Muskuloskeletal: No cyanosis, clubbing or edema noted bilaterally Neuro: CN II-XII intact, strength, sensation, reflexes Skin: No ulcerative lesions noted or rashes.  Scarring from knee surgery bilaterally. Psychiatry: Judgement and insight appear normal. Mood is appropriate for condition and setting          Labs on Admission:  Basic Metabolic Panel: Recent Labs  Lab 12/15/21 1257 12/20/21 0941  NA 133* 138  K 4.6 4.0  CL 101 108  CO2 27 24  GLUCOSE 255* 181*  BUN 22 16  CREATININE 1.06 1.05  CALCIUM 8.8* 8.2*   Liver Function Tests: No results for input(s): "AST",  "ALT", "ALKPHOS", "BILITOT", "PROT", "ALBUMIN" in the last 168 hours. No results for input(s): "LIPASE", "AMYLASE" in the last 168 hours. No results for input(s): "AMMONIA" in the last 168 hours. CBC: Recent Labs  Lab 12/15/21 1257 12/20/21 0941  WBC 8.5 7.4  NEUTROABS 6.3  --   HGB 14.7 13.4  HCT 42.0 39.6  MCV 91.9 92.1  PLT 196 103*   Cardiac Enzymes: No results for input(s): "CKTOTAL", "CKMB", "CKMBINDEX", "TROPONINI" in the last 168 hours.  BNP (last 3 results) No results for input(s): "BNP" in the last 8760 hours.  ProBNP (last 3 results) No results for input(s): "PROBNP" in the last 8760 hours.  CBG: No results for input(s): "GLUCAP" in the last 168 hours.  Radiological Exams on Admission: CT ANGIO HEAD NECK W WO CM  Result Date: 12/20/2021 CLINICAL DATA:  Dizziness, persistent/recurrent, cardiac or vascular cause suspected. EXAM: CT ANGIOGRAPHY HEAD AND NECK TECHNIQUE: Multidetector CT  imaging of the head and neck was performed using the standard protocol during bolus administration of intravenous contrast. Multiplanar CT image reconstructions and MIPs were obtained to evaluate the vascular anatomy. Carotid stenosis measurements (when applicable) are obtained utilizing NASCET criteria, using the distal internal carotid diameter as the denominator. RADIATION DOSE REDUCTION: This exam was performed according to the departmental dose-optimization program which includes automated exposure control, adjustment of the mA and/or kV according to patient size and/or use of iterative reconstruction technique. CONTRAST:  85m OMNIPAQUE IOHEXOL 350 MG/ML SOLN COMPARISON:  CT head without contrast 12/08/2007 FINDINGS: CT HEAD FINDINGS Brain: Mild atrophy and white matter disease demonstrates interval progression. No acute infarct, hemorrhage, or mass lesion is present. Basal ganglia are intact. Insular ribbon is normal bilaterally. No acute or porta scratched at no acute or focal cortical  abnormalities are present. The brainstem and cerebellum are within normal limits. Vascular: No hyperdense vessel or unexpected calcification. Skull: Previously described lucencies in the calvarium are stable and benign. No follow-up necessary. No acute abnormality is present. No significant extracranial soft tissue lesion is present. Sinuses: The paranasal sinuses and mastoid air cells are clear. Orbits: The globes and orbits are within normal limits. Review of the MIP images confirms the above findings CTA NECK FINDINGS Aortic arch: Atherosclerotic calcifications present at the aortic arch great vessel origins. No significant stenosis or aneurysm present. Right carotid system: Right common carotid artery demonstrates minimal atherosclerotic change. Atherosclerotic calcifications are present at the bifurcation significant stenosis. Cervical right ICA is otherwise. Left carotid system: The left common carotid artery demonstrates minimal mural calcifications without significant stenosis. Minimal atherosclerotic changes are present the carotid bifurcation. No significant stenosis is present. The cervical left ICA is. Vertebral arteries: The vertebral arteries are codominant. Both vertebral arteries originate the subclavian arteries without significant stenosis. No significant stenosis present either vertebral artery in the neck. Skeleton: Multilevel degenerative changes are present throughout the cervical spine. No focal osseous lesions are present. Other neck: Soft tissues the neck are otherwise unremarkable. Salivary glands are within normal limits. Thyroid is normal. No significant adenopathy is present. No focal mucosal or submucosal lesions are present. Upper chest: None substernal goiter is stable since 2014. Recommend follow-up the lung apices are clear. Thoracic inlet is otherwise within normal limits. Review of the MIP images confirms the above findings CTA HEAD FINDINGS Anterior circulation: Atherosclerotic  changes are present within the cavernous internal carotid arteries bilaterally without significant stenosis through the ICA termini. The left A1 segment is dominant. The A1 and M1 segments are within normal limits. MCA bifurcations are intact. The anterior communicating artery is patent. Moderate attenuation of distal ACA and MCA branch vessels is present without a significant proximal stenosis or occlusion. Posterior circulation: The vertebral arteries are codominant. The right PICA origin is visualized and within normal limits. Left AICA is dominant. The vertebrobasilar junction and basilar artery is normal. Both posterior cerebral arteries originate from basilar tip. Moderate attenuation of distal PCA branch vessels is present without a significant proximal stenosis or occlusion Venous sinuses: The dural sinuses are patent. The straight sinus deep cerebral veins are intact. Cortical veins are within normal limits. No significant vascular malformation is evident. Anatomic variants: None Review of the MIP images confirms the above findings IMPRESSION: 1. Moderate distal small vessel disease without a significant proximal stenosis, aneurysm, or branch vessel occlusion within the Circle of Willis. 2. Atherosclerotic calcifications at the aortic arch and bilateral carotid bifurcations without significant stenosis in the neck 3.  Multilevel spondylosis of the cervical spine. 4. Stable substernal goiter. 5. Aortic Atherosclerosis (ICD10-I70.0). Electronically Signed   By: San Morelle M.D.   On: 12/20/2021 11:41    EKG: I independently viewed the EKG done and my findings are as followed: Sinus rhythm rate of 90.  Nonspecific ST-T changes.  QTc 438.  Assessment/Plan Present on Admission:  Suspected cerebrovascular accident (CVA)  Principal Problem:   Suspected cerebrovascular accident (CVA)  Suspected CVA Sudden onset dizziness around 7:30 AM Left-sided dysarthria in the ED Admitted for TIA/stroke  work-up CT angio head and neck MRI brain, the patient is claustrophobic, added IV Ativan 0.5 mg x 1 Permissive hypertension until acute CVA is ruled out.   For permissive hypertension, treat SBP greater than 220 or DBP greater than 120 with short acting IV labetalol. If acute CVA is ruled out, resume home oral antihypertensives. Fasting lipid panel in the morning, goal LDL less than 70 Hemoglobin A1c, goal A1c less than 7.0 2D echo with bubble study Neurochecks every 4 hours Monitor on telemetry PT/OT/speech therapist assessment Fall/aspiration precautions The patient is on 81 mg aspirin daily and 5 mg Crestor daily, resume. Neurology/stroke team consulted by EDP Defer antiplatelet therapy to neurology.  Paroxysmal atrial fibrillation, not anticoagulated CHA2DS2-VASc score 8 Awaiting MRI brain to rule out stroke prior to initiating anticoagulation. Defer timing of anticoagulation to neurology/stroke team if acute CVA is present. Continue to closely monitor on telemetry Consider cardiology input in the morning Rate controlled on Lopressor, resume when indicated.  Type 2 diabetes with hyperglycemia Last hemoglobin A1c recorded 6.6 in 2016. Obtain hemoglobin A1c, goal less than 7.0 Hold off home oral hypoglycemics Start insulin sliding scale.  Essential hypertension, BP is not at goal, elevated Permissive hypertension until acute CVA is ruled out Treat SBP greater than 220 or DBP greater than 120 IV labetalol as needed with parameters in place  Hyperlipidemia The patient is on Crestor 5 mg daily, resume Fasting lipid panel in the morning Goal LDL less than 70.  GERD Resume home PPI  Diabetic polyneuropathy Resume home regimen  Coronary artery disease status post CABG, PCI with stent placement Peripheral vascular disease with carotid artery disease Resume home aspirin 81 mg daily, Crestor 5 mg daily for now. Follow CTA head and neck results The patient denies any  anginal symptoms.  Post colonoscopy on 12/17/2021 with polyps removed and anal fissure treated No acute issues at this time.  Acute thrombocytopenia, unclear etiology No overt bleeding Platelet count 103, platelet count 196, 5 days ago. Monitor for now    Critical care time: 65 minutes.     DVT prophylaxis: Subcu Lovenox daily  Code Status: Full code.  Family Communication: Daughter and wife at bedside.  Disposition Plan: Admitted to telemetry medical unit  Consults called: Neurology/stroke team consulted by EDP.  Admission status: Inpatient status.   Status is: Inpatient The patient requires at least 2 midnights for further evaluation and treatment of present condition.   Kayleen Memos MD [facility] Pager [phone]  If 7PM-7AM, please contact night-coverage [email or web address] Password [email or web address]  12/20/2021, 1:17 PM

## 2021-12-20 NOTE — ED Triage Notes (Signed)
Patient to ED via [facility] from home for intermittent dizziness that started approx at 0700. Patient states he did have coloscopy on Thursday. Aox4 at this time.

## 2021-12-21 ENCOUNTER — Encounter: Payer: Self-pay | Admitting: Gastroenterology

## 2021-12-21 ENCOUNTER — Inpatient Hospital Stay (HOSPITAL_BASED_OUTPATIENT_CLINIC_OR_DEPARTMENT_OTHER)
Admit: 2021-12-21 | Discharge: 2021-12-21 | Disposition: A | Discharge status: Home / Self Care | Payer: Medicare Other | Attending: Internal Medicine | Admitting: Internal Medicine

## 2021-12-21 ENCOUNTER — Encounter: Payer: Self-pay | Admitting: Internal Medicine

## 2021-12-21 DIAGNOSIS — R42 Dizziness and giddiness: Secondary | ICD-10-CM

## 2021-12-21 DIAGNOSIS — R0989 Other specified symptoms and signs involving the circulatory and respiratory systems: Secondary | ICD-10-CM | POA: Diagnosis not present

## 2021-12-21 DIAGNOSIS — R9431 Abnormal electrocardiogram [ECG] [EKG]: Secondary | ICD-10-CM | POA: Diagnosis not present

## 2021-12-21 DIAGNOSIS — I10 Essential (primary) hypertension: Secondary | ICD-10-CM | POA: Diagnosis not present

## 2021-12-21 LAB — ECHOCARDIOGRAM COMPLETE BUBBLE STUDY
AR max vel: 1.59 cm2
AV Area VTI: 1.68 cm2
AV Area mean vel: 1.51 cm2
AV Mean grad: 4 mmHg
AV Peak grad: 6.7 mmHg
Ao pk vel: 1.29 m/s
Area-P 1/2: 3.7 cm2
S' Lateral: 3.55 cm

## 2021-12-21 LAB — CBC
HCT: 41.6 % (ref 39.0–52.0)
Hemoglobin: 14.1 g/dL (ref 13.0–17.0)
MCH: 30.7 pg (ref 26.0–34.0)
MCHC: 33.9 g/dL (ref 30.0–36.0)
MCV: 90.6 fL (ref 80.0–100.0)
Platelets: 101 10*3/uL — ABNORMAL LOW (ref 150–400)
RBC: 4.59 MIL/uL (ref 4.22–5.81)
RDW: 14 % (ref 11.5–15.5)
WBC: 5.7 10*3/uL (ref 4.0–10.5)
nRBC: 0 % (ref 0.0–0.2)

## 2021-12-21 LAB — BASIC METABOLIC PANEL
Anion gap: 8 (ref 5–15)
BUN: 13 mg/dL (ref 8–23)
CO2: 26 mmol/L (ref 22–32)
Calcium: 9 mg/dL (ref 8.9–10.3)
Chloride: 106 mmol/L (ref 98–111)
Creatinine, Ser: 0.89 mg/dL (ref 0.61–1.24)
GFR, Estimated: >60 mL/min (ref >60)
Glucose, Bld: 194 mg/dL — ABNORMAL HIGH (ref 70–99)
Potassium: 4.2 mmol/L (ref 3.5–5.1)
Sodium: 140 mmol/L (ref 135–145)

## 2021-12-21 LAB — LIPID PANEL
Cholesterol: 130 mg/dL (ref 0–200)
HDL: 42 mg/dL (ref >40)
LDL Cholesterol: 54 mg/dL (ref 0–99)
Total CHOL/HDL Ratio: 3.1 RATIO
Triglycerides: 168 mg/dL — ABNORMAL HIGH (ref <150)
VLDL: 34 mg/dL (ref 0–40)

## 2021-12-21 LAB — MAGNESIUM: Magnesium: 2.2 mg/dL (ref 1.7–2.4)

## 2021-12-21 LAB — PHOSPHORUS: Phosphorus: 3.2 mg/dL (ref 2.5–4.6)

## 2021-12-21 MED ORDER — ROSUVASTATIN CALCIUM 20 MG PO TABS: 20.0000 mg | ORAL_TABLET | Freq: Every day | ORAL | Status: DC

## 2021-12-21 MED ORDER — METFORMIN HCL ER 500 MG PO TB24: 1000.0000 mg | ORAL_TABLET | Freq: Two times a day (BID) | ORAL | 3 refills | Status: AC

## 2021-12-21 MED ORDER — CLOPIDOGREL BISULFATE 75 MG PO TABS: 75.0000 mg | ORAL_TABLET | Freq: Every day | ORAL | 0 refills | Status: DC

## 2021-12-21 MED ORDER — CLOPIDOGREL BISULFATE 75 MG PO TABS
75.0000 mg | ORAL_TABLET | Freq: Every day | ORAL | Status: DC
  Administered 2021-12-21: 75 mg via ORAL
  Filled 2021-12-21: qty 1

## 2021-12-21 MED ORDER — CLOPIDOGREL BISULFATE 75 MG PO TABS: 75.0000 mg | ORAL_TABLET | Freq: Every day | ORAL | 5 refills | Status: DC

## 2021-12-21 MED ORDER — ASPIRIN 81 MG PO TBEC: 81.0000 mg | DELAYED_RELEASE_TABLET | Freq: Every day | ORAL | 0 refills | Status: AC

## 2021-12-21 NOTE — Care Management CC44 (Signed)
Condition Code 44 Documentation Completed  Patient Details  Name: Christopher [name] MRN: [id number] Date of Birth: April 04, 1932   Condition Code 44 given:  Yes Patient signature on Condition Code 44 notice:  Yes Documentation of 2 MD's agreement:  Yes Code 44 added to claim:  Yes    Beverly Sessions, RN 12/21/2021, 4:06 PM

## 2021-12-21 NOTE — Consult Note (Signed)
NEUROLOGY CONSULTATION NOTE   Date of service: December 21, 2021 Patient Name: Christopher [name] MRN:  [id number] DOB:  11-Dec-1931 Reason for consult: transient vertigo Requesting physician: Dr. Irene Pap _ _ _   _ __   _ __ _ _  __ __   _ __   __ _  History of Present Illness   88 yo man with hx CAD, carotid stenosis, HL, HTN, PVD, remote dx a fib favored to be postop not on [facility] but on ASA '81mg'$  daily who presents after transient episode of vertigo/room spinning this AM. Patient stopped his ASA earlier this week prior to colonoscopy and restarted it last night. The vertigo lasted approx one hour and resolved. He had one remote episode that was similar and likewise resolved. MRI brain showed tiny remote R cerebellar infarcts but no e/o acute infarct (personal review). He is f/b Dr. Rockey Situ with cardiology. A fib was noted post-op following CABG in 2016 but he has remained in NSR since that time and anticoagulation was discontinued years ago.   ROS   Per HPI: all other systems reviewed and are negative  Past History   I have reviewed the following:  Past Medical History:  Diagnosis Date   Carotid artery stenosis    a. RICA 40%   Coronary artery disease    a. PCI/DES to mLAD and dRCA in 2005, residual OM1 50%; b. nuclear stress test 02/2008: no ischemia; c. nuclear stress test 11/2009: mild ischemia in the inferior wall c/w previous stress test; d. cath 09/17/2014: critical ostial/prox LAD estimated at 90-95%, critical prox LCx, RCA w/ mild diff dz, EF >55%, no WMA   GERD (gastroesophageal reflux disease)    Hyperlipidemia    Hypertension    PVD (peripheral vascular disease) ([place])    Past Surgical History:  Procedure Laterality Date   CARDIAC CATHETERIZATION     CHOLECYSTECTOMY  2016   COLONOSCOPY WITH PROPOFOL N/A 12/17/2021   Procedure: COLONOSCOPY WITH PROPOFOL;  Surgeon: [name], MD;  Location: [facility] ENDOSCOPY;  Service: Gastroenterology;  Laterality: N/A;   CORONARY ARTERY BYPASS GRAFT  N/A 09/20/2014   Procedure: CORONARY ARTERY BYPASS GRAFTING (CABG) x   three using left internal mammary artery and right leg greater saphenous vein harvested endoscopically;  Surgeon: Melrose Nakayama, MD;  Location: [place];  Service: Open Heart Surgery;  Laterality: N/A;   TEE WITHOUT CARDIOVERSION N/A 09/20/2014   Procedure: TRANSESOPHAGEAL ECHOCARDIOGRAM (TEE);  Surgeon: Melrose Nakayama, MD;  Location: [place];  Service: Open Heart Surgery;  Laterality: N/A;   TOTAL KNEE ARTHROPLASTY     bilateral   Family History  Problem Relation Age of Onset   Heart failure Mother    Prostate cancer Neg Hx    Bladder Cancer Neg Hx    Kidney cancer Neg Hx    Social History   Socioeconomic History   Marital status: Married    Spouse name: Not on file   Number of children: Not on file   Years of education: Not on file   Highest education level: Not on file  Occupational History   Occupation: self employed  Tobacco Use   Smoking status: Former    Types: Cigarettes    Quit date: 09/14/1955    Years since quitting: 66.3   Smokeless tobacco: Never  Vaping Use   Vaping Use: Never used  Substance and Sexual Activity   Alcohol use: No   Drug use: No   Sexual activity: Not on file  Other Topics Concern   Not on file  Social History Narrative   Not on file   Social Determinants of Health   Financial Resource Strain: Not on file  Food Insecurity: Not on file  Transportation Needs: Not on file  Physical Activity: Not on file  Stress: Not on file  Social Connections: Not on file   No Known Allergies  Medications   Medications Prior to Admission  Medication Sig Dispense Refill Last Dose   aspirin 81 MG EC tablet Take 81 mg by mouth daily.   12/19/2021   cholecalciferol (VITAMIN D3) 25 MCG (1000 UNIT) tablet Take 1,000 Units by mouth daily.   12/20/2021   gabapentin (NEURONTIN) 100 MG capsule Take 100 mg by mouth 3 (three) times daily.   12/20/2021   lisinopril (ZESTRIL) 2.5 MG tablet  Take 2.5 mg by mouth daily.   12/19/2021   metFORMIN (GLUCOPHAGE-XR) 500 MG 24 hr tablet Take 1,000 mg by mouth daily with breakfast.   12/19/2021   metoprolol tartrate (LOPRESSOR) 25 MG tablet Take 0.5 tablets (12.5 mg total) by mouth 2 (two) times daily. 90 tablet 1 12/19/2021   omeprazole (PRILOSEC) 20 MG capsule Take 20 mg by mouth daily.   12/19/2021   rosuvastatin (CRESTOR) 5 MG tablet Take 5 mg by mouth daily.   12/19/2021   celecoxib (CELEBREX) 200 MG capsule Take 200 mg by mouth daily. (Patient not taking: Reported on 12/17/2021)      lidocaine (XYLOCAINE) 2 % solution Use as directed 15 mLs in the mouth or throat as needed (Anal pain). (Patient not taking: Reported on 12/14/2021) 15 mL 0    Magnesium 250 MG TABS Take by mouth daily.         Current Facility-Administered Medications:    acetaminophen (TYLENOL) tablet 650 mg, 650 mg, Oral, Q6H PRN, Kayleen Memos, DO   aspirin EC tablet 81 mg, 81 mg, Oral, Daily, Hall, Carole N, DO   enoxaparin (LOVENOX) injection 40 mg, 40 mg, Subcutaneous, Q24H, Hall, Carole N, DO, 40 mg at 12/20/21 1342   gabapentin (NEURONTIN) capsule 100 mg, 100 mg, Oral, TID, Hall, Carole N, DO, 100 mg at 12/20/21 2131   labetalol (NORMODYNE) injection 5 mg, 5 mg, Intravenous, Q2H PRN, Hall, Carole N, DO   melatonin tablet 5 mg, 5 mg, Oral, QHS PRN, [place] Crane, Carole N, DO, 5 mg at 12/20/21 2131   pantoprazole (PROTONIX) EC tablet 40 mg, 40 mg, Oral, Daily, Hall, Carole N, DO, 40 mg at 12/20/21 1341   polyethylene glycol (MIRALAX / GLYCOLAX) packet 17 g, 17 g, Oral, Daily PRN, Hall, Carole N, DO   rosuvastatin (CRESTOR) tablet 5 mg, 5 mg, Oral, Daily, Irene Pap N, DO  Vitals   Vitals:   12/20/21 2100 12/20/21 2102 12/21/21 0010 12/21/21 0422  BP: (!) 129/93 137/89 121/80 (!) 136/92  Pulse: (!) 108 (!) 105 (!) 104 (!) 103  Resp:   16 16  Temp:   97.8 F (36.6 C) 97.9 F (36.6 C)  TempSrc:      SpO2: 100% 99% 98% 98%  Weight:      Height:         Body mass  index is 28.17 kg/m.  Physical Exam   Physical Exam Gen: A&O x4, NAD HEENT: Atraumatic, normocephalic;mucous membranes moist; oropharynx clear, tongue without atrophy or fasciculations. Neck: Supple, trachea midline. Resp: CTAB, no w/r/r CV: RRR, no m/g/r; nml S1 and S2. 2+ symmetric peripheral pulses. Abd: soft/NT/ND; nabs x 4 quad Extrem:  Nml bulk; no cyanosis, clubbing, or edema.  Neuro: *MS: A&O x4. Follows multi-step commands.  *Speech: fluid, nondysarthric, able to name and repeat *CN:    I: Deferred   II,III: PERRLA, VFF by confrontation, optic discs unable to be visualized 2/2 pupillary constriction   III,IV,VI: EOMI w/o nystagmus, no ptosis   V: Sensation intact from V1 to V3 to LT   VII: Eyelid closure was full.  Smile symmetric.   VIII: Hearing intact to voice   IX,X: Voice normal, palate elevates symmetrically    XI: SCM/trap 5/5 bilat   XII: Tongue protrudes midline, no atrophy or fasciculations   *Motor:   Normal bulk.  No tremor, rigidity or bradykinesia. No pronator drift.    Strength: Dlt Bic Tri WrE WrF FgS Gr HF KnF KnE PlF DoF    Left '5 5 5 5 5 5 5 5 5 5 5 5    '$ Right '5 5 5 5 5 5 5 5 5 5 5 5    '$ *Sensory: Intact to light touch, pinprick, temperature vibration throughout. Symmetric. Propioception intact bilat.  No double-simultaneous extinction.  *Coordination:  Finger-to-nose, heel-to-shin, rapid alternating motions were intact. *Reflexes:  2+ and symmetric throughout without clonus; toes down-going bilat *Gait: deferred NIHSS = 0   Premorbid mRS = 0   Labs   CBC:  Recent Labs  Lab 12/15/21 1257 12/20/21 0941 12/21/21 0610  WBC 8.5 7.4 5.7  NEUTROABS 6.3  --   --   HGB 14.7 13.4 14.1  HCT 42.0 39.6 41.6  MCV 91.9 92.1 90.6  PLT 196 103* 101*    Basic Metabolic Panel:  Lab Results  Component Value Date   NA 138 12/20/2021   K 4.0 12/20/2021   CO2 24 12/20/2021   GLUCOSE 181 (H) 12/20/2021   BUN 16 12/20/2021   CREATININE 1.05  12/20/2021   CALCIUM 8.2 (L) 12/20/2021   GFRNONAA >60 12/20/2021   GFRAA 90 (L) 09/25/2014   Lipid Panel:  Lab Results  Component Value Date   LDLCALC 62 09/17/2014   HgbA1c:  Lab Results  Component Value Date   HGBA1C 7.9 (H) 12/20/2021   Urine Drug Screen: No results found for: "LABOPIA", "COCAINSCRNUR", "LABBENZ", "AMPHETMU", "THCU", "LABBARB"  Alcohol Level No results found for: "ETH"  Impression   88 yo man with hx CAD, carotid stenosis, HL, HTN, PVD, remote dx a fib favored to be postop not on AC but on ASA '81mg'$  daily who presents after transient episode of vertigo/room spinning this AM, now resolved. He had one remote episode in the past, potentially at time of his tiny R cerebellar infarcts, although he was not aware that he had a prior stroke. He does have history of a fib that occurred post-operatively after CABG in 2016 but anticoagulation was discontinued yrs ago as he has remained in NSR on intermittent monitoring since that time. Admit for TIA workup and telemetry. While a fib can certainly be transient post-op after CABG, if he were to have occult paroxysmal a fib he should be anticoagulated given his CHADS-VASc of 6. At a minimum would recommend 30 day ambulatory cardiac monitor after discharge and potentially ILR placement. Alternatively patient may be started on empiric anticoagulation. Would recommend touching base with Dr. Rockey Situ his outpatient cardiologist in the AM to discuss.   Recommendations   - Admit for stroke workup - No acute stroke by MRI; goal normotension; avoid hypotension - CTA or MRA H&N - For now plan for ASA '81mg'$  daily +  plavix '75mg'$  daily x21 days f/b plavix '75mg'$  daily monotherapy after that - Reach out to Dr. Rockey Situ in AM regarding possible anticoagulation; see discussion above. If patient is started on therapeutic anticoagulation he would have an indication to continue any antiplatelets from a neuro standpoint, although he may have a cardiac  indication to continue ASA given his hx CAD. If he is not started on anticoagulation at this time, he should undergo at least 30 day amb cardiac monitoring and potentially consideration of ILR placement. - TTE  - Check A1c and LDL + add statin per guidelines - q4 hr neuro checks - STAT head CT for any change in neuro exam - Tele - PT eval - Stroke education - Amb referral to neurology upon discharge   Will continue to follow. ______________________________________________________________________   Thank you for the opportunity to take part in the care of this patient. If you have any further questions, please contact the neurology consultation attending.  Signed,  Su Monks, MD [facility] [phone]  If 7pm- 7am, please page neurology on call as listed in [place].

## 2021-12-21 NOTE — Care Management (Signed)
  Transition of Care [facility]) Screening Note   Patient Details  Name: Christopher [name] Date of Birth: 04/12/1932   Transition of Care [facility]) CM/SW Contact:    Pete Pelt, RN Phone Number: 12/21/2021, 4:19 PM    Transition of Care Department [facility]) has reviewed patient and no TOC needs have been identified at this time. We will continue to monitor patient advancement through interdisciplinary progression rounds. If new patient transition needs arise, please place a TOC consult.

## 2021-12-21 NOTE — Consult Note (Signed)
Cardiology Consult    Patient ID: Christopher [name] MRN: [id number], DOB/AGE: 1931/11/17   Admit date: 12/20/2021 Date of Consult: 12/21/2021  Primary Physician: Christopher Hartigan, MD Primary Cardiologist: Christopher Rogue, MD Requesting Provider: Dr. Sabino [name]  Patient Profile    Christopher [name] is a 88 y.o. male with a history of carotid artery stenosis, CAD s/p PCI/DES to mLAD and dRCA in 2005, CABG x3 in 2016, DM2, HTN, HLD, GERD, PSVT, freq PVCs, periop Afib (2016), and PVD, who presented w/ dizziness concerning for stroke vs TIA, who is being seen today for the evaluation of h/o PAF at the request of Christopher [name].  Past Medical History   Past Medical History:  Diagnosis Date   Atrial fibrillation ([place])    a. 08/2014 in setting of NSTEMI - occurred immediately prior to CABG. Managed w/ short-term amio/OAC. No known recurrence.   Carotid artery stenosis    a. 10/2010 Carotid U/S: 1-49% bilat ICA stenoses.   Coronary artery disease    a. 2005 s/p PCI/DES to mLAD and dRCA; b. 09/17/2014 Cath: critical ostial/prox LAD estimated at 90-95%, critical prox LCx, RCA w/ mild diff dz, EF >55%; c. 08/2014 CABG x 3: LIMA->LAD, VG->OM, VG->RPDA.   Diastolic dysfunction    a. 03/2021 Echo: EF 55%, no rwma, GrII DD. Nl RV fxn. Mildly dil LA. Mild MR; b. 11/2021 Echo: EF 55-60%, no rwma, GrI DD, nl RV size/fxn. Mildly dil LA. AoV sclerosis w/o stenosis.   GERD (gastroesophageal reflux disease)    Hyperlipidemia    Hypertension    PSVT (paroxysmal supraventricular tachycardia) ([place])    a. 07/2020 Zio: Predominantly sinus rhythm. 2 runs NSVT/Atrial Tachycardia.  17 runs SVT, fastest 10 beats @ 150. Longest 12 beats @ 121. Isolated PVCs (20.7% burden). Triggered events = PACs/PVCs.   PVC's (premature ventricular contractions)    a. 07/2020 Zio: [id number] PVCs over 14 days (20.7% burden)->managed w/ low-dose metoprolol.   PVD (peripheral vascular disease) [facility])     Past Surgical History:  Procedure Laterality Date    CARDIAC CATHETERIZATION     CHOLECYSTECTOMY  2016   COLONOSCOPY WITH PROPOFOL N/A 12/17/2021   Procedure: COLONOSCOPY WITH PROPOFOL;  Surgeon: Christopher Bellows, MD;  Location: [facility];  Service: Gastroenterology;  Laterality: N/A;   CORONARY ARTERY BYPASS GRAFT N/A 09/20/2014   Procedure: CORONARY ARTERY BYPASS GRAFTING (CABG) x   three using left internal mammary artery and right leg greater saphenous vein harvested endoscopically;  Surgeon: Christopher Nakayama, MD;  Location: [facility];  Service: Open Heart Surgery;  Laterality: N/A;   TEE WITHOUT CARDIOVERSION N/A 09/20/2014   Procedure: TRANSESOPHAGEAL ECHOCARDIOGRAM (TEE);  Surgeon: Christopher Nakayama, MD;  Location: [facility];  Service: Open Heart Surgery;  Laterality: N/A;   TOTAL KNEE ARTHROPLASTY     bilateral     Allergies  No Known Allergies  History of Present Illness    88 y.o. male with a history of carotid artery stenosis, CAD s/p CABG x3 in 2016, DM2, HTN, HLD, GERD, PSVT, freq PVCs, periop Afib (2016), and PVD.  Cardiac hx dates back to 2005, when he underwent PCI/DES to the LAD and RCA.  In 08/2014, he suffered a NSTEMI and was found to have critical ostial LAD and proximal LCX dzs on cath.  He was planned for CABG and prior to CABG, developed Afib w/ RVR requiring amio.  He underwent successful CABG x 3 and was d/c'd in sinus rhythm.  He was maintained on amio and  oral anticoagulation for a short-course following discharge, and both were subsequently d/c'd.  He has no known recurrence of afib, however, in the setting of dizziness in March 2022, he wore a Zio monitor and it showed 17 brief runs of PSVT as well as a 20.7% PVC burden.  He was placed on metoprolol 12.'5mg'$  BID and an echo was ordered, but pt felt better and didn't carry through w/ echo until 03/2021, which showed an EF of 55% w/ grade II diast dysfxn.   He is followed by Dr. Rockey [name] and was most recently seen in the office in April.  In the setting of 7 year since ACS,  plavix was d/c'd and he was cont on ASA.  Christopher [name] indicates that he had been doing well until the morning of 7/23, when he was at a restaurant eating breakfast and had sudden onset of a throbbing sensation in the back of his head that was immediately assoc w/ dizziness and room spinning.  This lasted about 30 mins.  His wife called EMS and upon their arrival, he still felt lightheaded, but was no longer vertiginous.  He was taken to the [facility] ED where, CTA head/neck showed moderate distal small vessel dzs w/o significant stenosis, aneurysm, or branch vessel occlusion.  Atherosclerotic Ca2+ @ the Ao arch and bilat carotid bifurcations w/o significant stenosis was noted.  MRI of the brain showed minimal chronic small vessel ischemic changes w/in the cerebral white matter w/ tiny chronic infarcts within the R cerebellar hemisphere w/o acute intracranial abnormalities.  He was admitted to tele and seen by neuro w/ rec for ASA/Plavix and outpt event monitoring.  Echo showed nl LV fxn w/ GrI DD, and w/o significant valvular dzs.    Christopher [name] had a more mild and brief episode of lightheadedness this AM that lasted about 15 seconds.  On tele, he has had intermittent junctional tachycardia w/ rates from 100-112.  He has also had intermittent sinus bradycardia w/ baseline rates in the 50's and brief, rare pauses up to 2.6 seconds.  Currently, he has no complaints and is eager to go home.  Inpatient Medications     aspirin EC  81 mg Oral Daily   clopidogrel  75 mg Oral Daily   enoxaparin (LOVENOX) injection  40 mg Subcutaneous Q24H   gabapentin  100 mg Oral TID   pantoprazole  40 mg Oral Daily   [START ON 12/22/2021] rosuvastatin  20 mg Oral Daily    Family History    Family History  Problem Relation Age of Onset   Heart failure Mother    Prostate cancer Neg Hx    Bladder Cancer Neg Hx    Kidney cancer Neg Hx    He indicated that his mother is deceased. He indicated that his father is deceased. He  indicated that the status of his neg hx is unknown.   Social History    Social History   Socioeconomic History   Marital status: Married    Spouse name: Not on file   Number of children: Not on file   Years of education: Not on file   Highest education level: Not on file  Occupational History   Occupation: self employed  Tobacco Use   Smoking status: Former    Types: Cigarettes    Quit date: 09/14/1955    Years since quitting: 66.3   Smokeless tobacco: Never  Vaping Use   Vaping Use: Never used  Substance and Sexual Activity   Alcohol use:  No   Drug use: No   Sexual activity: Not on file  Other Topics Concern   Not on file  Social History Narrative   Lives locally w/ wife.   Social Determinants of Health   Financial Resource Strain: Not on file  Food Insecurity: Not on file  Transportation Needs: Not on file  Physical Activity: Not on file  Stress: Not on file  Social Connections: Not on file  Intimate Partner Violence: Not on file     Review of Systems    General:  No chills, fever, night sweats or weight changes.  Cardiovascular:  No chest pain, dyspnea on exertion, edema, orthopnea, palpitations, paroxysmal nocturnal dyspnea.   +++ lightheadedness. Dermatological: No rash, lesions/masses Respiratory: No cough, dyspnea Urologic: No hematuria, dysuria Abdominal:   No nausea, vomiting, diarrhea, bright red blood per rectum, melena, or hematemesis Neurologic:  +++ vertiginous symptoms/dizziness assoc w/ sensation of room spinning, followed by lightheadedness.  No wkns, changes in mental status. All other systems reviewed and are otherwise negative except as noted above.  Physical Exam    Blood pressure (!) 141/90, pulse 88, temperature 98.4 F (36.9 C), resp. rate 18, height '5\' 11"'$  (1.803 m), weight 91.6 kg, SpO2 98 %.  General: Pleasant, NAD Psych: Normal affect. Neuro: Alert and oriented X 3. Moves all extremities spontaneously. HEENT: Normal  Neck:  Supple without bruits or JVD. Lungs:  Resp regular and unlabored, CTA. Heart: RRR no s3, s4, or murmurs. Abdomen: Soft, non-tender, non-distended, BS + x 4.  Extremities: No clubbing, cyanosis or edema. DP/PT1+, Radials 2+ and equal bilaterally.  Labs    Lab Results  Component Value Date   WBC 5.7 12/21/2021   HGB 14.1 12/21/2021   HCT 41.6 12/21/2021   MCV 90.6 12/21/2021   PLT 101 (L) 12/21/2021    Recent Labs  Lab 12/21/21 0610  NA 140  K 4.2  CL 106  CO2 26  BUN 13  CREATININE 0.89  CALCIUM 9.0  GLUCOSE 194*   Lab Results  Component Value Date   CHOL 130 12/21/2021   HDL 42 12/21/2021   LDLCALC 54 12/21/2021   TRIG 168 (H) 12/21/2021     Radiology Studies    MR BRAIN WO CONTRAST  Result Date: 12/20/2021 CLINICAL DATA:  Provided history: Dizziness, persistent/recurrent, cardiac or vascular cause suspected. EXAM: MRI HEAD WITHOUT CONTRAST TECHNIQUE: Multiplanar, multiecho pulse sequences of the brain and surrounding structures were obtained without intravenous contrast. COMPARISON:  CT angiogram head/neck and non-contrast head CT performed earlier today 12/20/2021. FINDINGS: Brain: Mild generalized cerebral atrophy. Multifocal T2 FLAIR hyperintense signal abnormality within the cerebral white matter, nonspecific but compatible with minimal chronic small vessel ischemic disease. Few scattered punctate supratentorial chronic parenchymal microhemorrhages. Tiny chronic infarcts within the right cerebellar hemisphere. There is no acute infarct. No evidence of an intracranial mass. No extra-axial fluid collection. No midline shift. Vascular: Maintained flow voids within the proximal large arterial vessels. Skull and upper cervical spine: No focal suspicious marrow lesion. Incompletely assessed cervical spondylosis. Sinuses/Orbits: No mass or acute finding within the imaged orbits. Prior bilateral ocular lens replacement. IMPRESSION: No evidence of acute intracranial abnormality.  Minimal chronic small vessel ischemic changes within the cerebral white matter. Tiny chronic infarcts within the right cerebellar hemisphere. Mild generalized cerebral atrophy. Electronically Signed   By: Kellie Simmering D.O.   On: 12/20/2021 15:26   CT ANGIO HEAD NECK W WO CM  Result Date: 12/20/2021 CLINICAL DATA:  Dizziness, persistent/recurrent, cardiac or  vascular cause suspected. EXAM: CT ANGIOGRAPHY HEAD AND NECK TECHNIQUE: Multidetector CT imaging of the head and neck was performed using the standard protocol during bolus administration of intravenous contrast. Multiplanar CT image reconstructions and MIPs were obtained to evaluate the vascular anatomy. Carotid stenosis measurements (when applicable) are obtained utilizing NASCET criteria, using the distal internal carotid diameter as the denominator. RADIATION DOSE REDUCTION: This exam was performed according to the departmental dose-optimization program which includes automated exposure control, adjustment of the mA and/or kV according to patient size and/or use of iterative reconstruction technique. CONTRAST:  65m OMNIPAQUE IOHEXOL 350 MG/ML SOLN COMPARISON:  CT head without contrast 12/08/2007 FINDINGS: CT HEAD FINDINGS Brain: Mild atrophy and white matter disease demonstrates interval progression. No acute infarct, hemorrhage, or mass lesion is present. Basal ganglia are intact. Insular ribbon is normal bilaterally. No acute or porta scratched at no acute or focal cortical abnormalities are present. The brainstem and cerebellum are within normal limits. Vascular: No hyperdense vessel or unexpected calcification. Skull: Previously described lucencies in the calvarium are stable and benign. No follow-up necessary. No acute abnormality is present. No significant extracranial soft tissue lesion is present. Sinuses: The paranasal sinuses and mastoid air cells are clear. Orbits: The globes and orbits are within normal limits. Review of the MIP images  confirms the above findings CTA NECK FINDINGS Aortic arch: Atherosclerotic calcifications present at the aortic arch great vessel origins. No significant stenosis or aneurysm present. Right carotid system: Right common carotid artery demonstrates minimal atherosclerotic change. Atherosclerotic calcifications are present at the bifurcation significant stenosis. Cervical right ICA is otherwise. Left carotid system: The left common carotid artery demonstrates minimal mural calcifications without significant stenosis. Minimal atherosclerotic changes are present the carotid bifurcation. No significant stenosis is present. The cervical left ICA is. Vertebral arteries: The vertebral arteries are codominant. Both vertebral arteries originate the subclavian arteries without significant stenosis. No significant stenosis present either vertebral artery in the neck. Skeleton: Multilevel degenerative changes are present throughout the cervical spine. No focal osseous lesions are present. Other neck: Soft tissues the neck are otherwise unremarkable. Salivary glands are within normal limits. Thyroid is normal. No significant adenopathy is present. No focal mucosal or submucosal lesions are present. Upper chest: None substernal goiter is stable since 2014. Recommend follow-up the lung apices are clear. Thoracic inlet is otherwise within normal limits. Review of the MIP images confirms the above findings CTA HEAD FINDINGS Anterior circulation: Atherosclerotic changes are present within the cavernous internal carotid arteries bilaterally without significant stenosis through the ICA termini. The left A1 segment is dominant. The A1 and M1 segments are within normal limits. MCA bifurcations are intact. The anterior communicating artery is patent. Moderate attenuation of distal ACA and MCA branch vessels is present without a significant proximal stenosis or occlusion. Posterior circulation: The vertebral arteries are codominant. The  right PICA origin is visualized and within normal limits. Left AICA is dominant. The vertebrobasilar junction and basilar artery is normal. Both posterior cerebral arteries originate from basilar tip. Moderate attenuation of distal PCA branch vessels is present without a significant proximal stenosis or occlusion Venous sinuses: The dural sinuses are patent. The straight sinus deep cerebral veins are intact. Cortical veins are within normal limits. No significant vascular malformation is evident. Anatomic variants: None Review of the MIP images confirms the above findings IMPRESSION: 1. Moderate distal small vessel disease without a significant proximal stenosis, aneurysm, or branch vessel occlusion within the Circle of Willis. 2. Atherosclerotic calcifications at the aortic  arch and bilateral carotid bifurcations without significant stenosis in the neck 3. Multilevel spondylosis of the cervical spine. 4. Stable substernal goiter. 5. Aortic Atherosclerosis (ICD10-I70.0). Electronically Signed   By: San Morelle M.D.   On: 12/20/2021 11:41   ECG & Cardiac Imaging    Normal sinus and sinus arrhythmia, Rate of 70 BPM, non-specific t-wave changes - personally reviewed.  Echo 12/21/21:  1. Left ventricular ejection fraction, by estimation, is 55 to 60%. The  left ventricle has normal function. The left ventricle has no regional  wall motion abnormalities. There is mild left ventricular hypertrophy.  Left ventricular diastolic parameters  are consistent with Grade I diastolic dysfunction (impaired relaxation).   2. Right ventricular systolic function is normal. The right ventricular  size is normal. Tricuspid regurgitation signal is inadequate for assessing  PA pressure.   3. Left atrial size was mildly dilated.   4. The mitral valve is normal in structure. No evidence of mitral valve  regurgitation. No evidence of mitral stenosis.   5. The aortic valve is normal in structure. Aortic valve  regurgitation is  not visualized. Aortic valve sclerosis is present, with no evidence of  aortic valve stenosis.   6. The inferior vena cava is normal in size with greater than 50%  respiratory variability, suggesting right atrial pressure of 3 mmHg.   7. Agitated saline contrast bubble study was negative, with no evidence  of any interatrial shunt.   Echo 04/15/21:  1. Left ventricular ejection fraction, by estimation, is 55%. The left  ventricle has normal function. The left ventricle has no regional wall  motion abnormalities. Left ventricular diastolic parameters are consistent  with Grade II diastolic dysfunction  (pseudonormalization).   2. Right ventricular systolic function is normal. The right ventricular  size is mildly enlarged.   3. Left atrial size was mildly dilated.   4. The mitral valve is normal in structure. Mild mitral valve  regurgitation.   5. The aortic valve was not well visualized. Aortic valve regurgitation  is not visualized.   6. The inferior vena cava is normal in size with greater than 50%  respiratory variability, suggesting right atrial pressure of 3 mmHg.   Assessment & Plan    1.  TIA/Vertiginous symptoms:  Pt presented 7/23 following a 30 min episode of vertiginous symptoms that started while eating breakfast.  Neuro w/u notable for small vessel dzs and tiny chronic R cerebellar infarcts, but no acute infarcts.  He does have a h/o periop AFib in the setting of nstemi/CABG in 08/2014, but has had no known recurrence.  On tele, he has had runs of junctional tachycardia as well as periodic bradycardia and brief pauses up to 2.6 seconds, but no symptomatic arrhythmias.  Echo showed nl LVEF w/ GrI DD.  We will arrange for outpt 30 day event monitoring and mail the monitor to Christopher [name].  2.  Junctional Tachycardia:  As above, intermittent jxnl tachy throughout hospitalization.  Rates low 100's to 1-teens.  Asymptomatic.  Cont low dose ? blocker.  3.  Sinus  bradycardia/Intermittent pauses:  Rates sometimes drop into the 50's w/ rare pauses of up to 2.6 seconds.  Asymptomatic.  Monitoring as above to assess for more prolonged pauses or profound bradycardia that might explain presentation.  4.  PAF:  In setting of NSTEMI/CABG in 08/2014.  No known recurrence.  No Afib/flutter on tele thus far.  Monitoring as above.  Cont ? blocker.  5.  Freq PVCs:  20.7%  burden on outpt monitoring in 07/2020 w/ reduced burden after starting low-dose ? blocker.  Nl EF by echo.  No significant PVCs here.  Cont ? blocker.  6.  CAD:  s/p CABG x 3 in 2016.  No chest pain or dyspnea.  Echo w/ nl EF.  Cont asa, ? blocker, and statin rx.  7.  Essential HTN:  Home meds initially held out of concern for stroke.  Rec resumption of home doses of ? blocker and acei @ d/c.  8.  HL:  LDL 54.  Cont statin rx.  9.  DMII:  A1c 7.9.  Per IM.  Signed, Murray Hodgkins, NP 12/21/2021, 3:06 PM  For questions or updates, please contact   Please consult [email or web address] for contact info under Cardiology/STEMI.

## 2021-12-21 NOTE — Progress Notes (Signed)
Neurology Progress Note   S:// Seen and examined   O:// Current vital signs: BP (!) 141/90 (BP Location: Left Arm)   Pulse 88   Temp 98.4 F (36.9 C)   Resp 18   Ht '5\' 11"'$  (1.803 m)   Wt 91.6 kg   SpO2 98%   BMI 28.17 kg/m  Vital signs in last 24 hours: Temp:  [97.8 F (36.6 C)-98.4 F (36.9 C)] 98.4 F (36.9 C) (07/24 1133) Pulse Rate:  [56-113] 88 (07/24 1133) Resp:  [16-18] 18 (07/24 1133) BP: (121-157)/(79-94) 141/90 (07/24 1133) SpO2:  [97 %-100 %] 98 % (07/24 1133) Weight:  [91.6 kg] 91.6 kg (07/23 2059) GENERAL: Awake, alert in NAD HEENT: - Normocephalic and atraumatic, dry mm, no LN++, no Thyromegally LUNGS - Clear to auscultation bilaterally with no wheezes CV - S1S2 RRR, no m/r/g, equal pulses bilaterally. ABDOMEN - Soft, nontender, nondistended with normoactive BS Ext: warm, well perfused, intact peripheral pulses, no edema NEURO:  Mental Status: AA&Ox3  Language: speech is nondysarthric.  Naming, repetition, fluency, and comprehension intact. Cranial Nerves: PERRL EOMI, visual fields full, no facial asymmetry, facial sensation intact, hearing intact, tongue/uvula/soft palate midline, normal sternocleidomastoid and trapezius muscle strength. No evidence of tongue atrophy or fibrillations Motor: No drift in any of the 4 extremities Tone: is normal and bulk is normal Sensation- Intact to light touch bilaterally Coordination: FTN intact bilaterally, no ataxia in BLE. Gait- deferred  NIHSS-0  Medications  Current Facility-Administered Medications:    acetaminophen (TYLENOL) tablet 650 mg, 650 mg, Oral, Q6H PRN, Kayleen Memos, DO, 650 mg at 12/21/21 0805   aspirin EC tablet 81 mg, 81 mg, Oral, Daily, Kayleen Memos, DO, 81 mg at 12/21/21 0805   enoxaparin (LOVENOX) injection 40 mg, 40 mg, Subcutaneous, Q24H, Hall, Carole N, DO, 40 mg at 12/20/21 1342   gabapentin (NEURONTIN) capsule 100 mg, 100 mg, Oral, TID, Hall, Carole N, DO, 100 mg at 12/21/21 0805    labetalol (NORMODYNE) injection 5 mg, 5 mg, Intravenous, Q2H PRN, Hall, Carole N, DO   melatonin tablet 5 mg, 5 mg, Oral, QHS PRN, Irene Pap N, DO, 5 mg at 12/20/21 2131   pantoprazole (PROTONIX) EC tablet 40 mg, 40 mg, Oral, Daily, Irene Pap N, DO, 40 mg at 12/21/21 0805   polyethylene glycol (MIRALAX / GLYCOLAX) packet 17 g, 17 g, Oral, Daily PRN, Kayleen Memos, DO   [START ON 12/22/2021] rosuvastatin (CRESTOR) tablet 20 mg, 20 mg, Oral, Daily, Dwyane Dee, MD Labs CBC    Component Value Date/Time   WBC 5.7 12/21/2021 0610   RBC 4.59 12/21/2021 0610   HGB 14.1 12/21/2021 0610   HGB 12.8 (L) 09/17/2014 0442   HCT 41.6 12/21/2021 0610   HCT 37.8 (L) 09/17/2014 0442   PLT 101 (L) 12/21/2021 0610   PLT 143 (L) 09/17/2014 0442   MCV 90.6 12/21/2021 0610   MCV 88 09/17/2014 0442   MCH 30.7 12/21/2021 0610   MCHC 33.9 12/21/2021 0610   RDW 14.0 12/21/2021 0610   RDW 15.7 (H) 09/17/2014 0442   LYMPHSABS 1.5 12/15/2021 1257   LYMPHSABS 1.6 09/17/2014 0442   MONOABS 0.4 12/15/2021 1257   MONOABS 0.7 09/17/2014 0442   EOSABS 0.1 12/15/2021 1257   EOSABS 0.1 09/17/2014 0442   BASOSABS 0.1 12/15/2021 1257   BASOSABS 0.1 09/17/2014 0442    CMP     Component Value Date/Time   NA 140 12/21/2021 0610   NA 133 (L) 09/17/2014 1191  K 4.2 12/21/2021 0610   K 4.0 09/17/2014 0442   CL 106 12/21/2021 0610   CL 106 09/17/2014 0442   CO2 26 12/21/2021 0610   CO2 27 09/17/2014 0442   GLUCOSE 194 (H) 12/21/2021 0610   GLUCOSE 160 (H) 09/17/2014 0442   BUN 13 12/21/2021 0610   BUN 18 09/17/2014 0442   CREATININE 0.89 12/21/2021 0610   CREATININE 0.86 09/17/2014 0442   CALCIUM 9.0 12/21/2021 0610   CALCIUM 8.2 (L) 09/17/2014 0442   PROT 7.3 12/02/2021 1059   PROT 6.2 (L) 07/08/2014 0435   ALBUMIN 4.1 12/02/2021 1059   ALBUMIN 2.8 (L) 07/08/2014 0435   AST 20 12/02/2021 1059   AST 71 (H) 07/08/2014 0435   ALT 23 12/02/2021 1059   ALT 164 (H) 07/08/2014 0435   ALKPHOS 64  12/02/2021 1059   ALKPHOS 268 (H) 07/08/2014 0435   BILITOT 1.2 12/02/2021 1059   BILITOT 5.7 (H) 07/08/2014 0435   GFRNONAA >60 12/21/2021 0610   GFRNONAA >60 09/17/2014 0442   GFRAA 90 (L) 09/25/2014 0510   GFRAA >60 09/17/2014 0442    glycosylated hemoglobin-7.9  Lipid Panel     Component Value Date/Time   CHOL 130 12/21/2021 0610   CHOL 121 09/17/2014 0442   TRIG 168 (H) 12/21/2021 0610   TRIG 129 09/17/2014 0442   TRIG 165 04/17/2008 0000   HDL 42 12/21/2021 0610   HDL 33 (L) 09/17/2014 0442   CHOLHDL 3.1 12/21/2021 0610   VLDL 34 12/21/2021 0610   VLDL 26 09/17/2014 0442   LDLCALC 54 12/21/2021 0610   LDLCALC 62 09/17/2014 0442   2D echo-pending  Imaging I have reviewed images in epic and the results pertinent to this consultation are:  CT-scan of the brain-no acute changes CT angiography head and neck-moderate distal small vessel disease without a significant proximal stenosis.  No branch vessel occlusion within the circle of Willis.  No aneurysm. Atherosclerotic calcifications of the aortic arch and bilateral carotid bifurcations without significant stenosis in the neck. Stable substernal goiter MRI examination of the brain-no new stroke.  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  Assessment:  88 year old man prior history of coronary artery disease, carotid stenosis, hyperlipidemia, hypertension, peripheral vascular disease, remote diagnosis of A-fib failure to be postop-not on anticoagulation presenting for transient episode of vertigo/room spinning with complete resolution.  No acute stroke on MRI but remote right cerebellar infarct seen. Reports another episode of vertiginous symptoms this morning completely resolved. Follows with cardiology-Dr. Rockey Situ Current symptomatology-posterior circulation TIA versus peripheral cause for vertigo.  Impression: Posterior circulation TIA versus peripheral cause for vertiginous  symptoms  Recommendations: Continue aspirin for now.  Add Plavix 75-continue dual antiplatelet for 3 weeks followed by aspirin only. I would recommend 30-day heart monitor to evaluate for any evidence of paroxysmal A-fib because that would change his management-will need anticoagulation and not antiplatelets at that time. No need for statin-LDL at goal. His A1c is 7.9.  He will need management of his blood sugars for goal A1c of less than 7 Okay to normalize blood pressure. [place] cardiology consultation and outpatient cardiac monitoring. Follow-up on echocardiogram-please call with questions. Follow-up with outpatient neurology clinic in 4 to 6 weeks. D/w Dr Sabino Gasser   -- Amie Portland, MD Neurologist [facility] Pager: [phone]

## 2021-12-21 NOTE — Progress Notes (Addendum)
SLP Cancellation Note  Patient Details Name: Christopher [name] MRN: [id number] DOB: [birth year]   Cancelled treatment:       Reason Eval/Treat Not Completed: SLP screened, no needs identified, will sign off   Chart review completed. MRI, 12/20/21, "No evidence of acute intracranial abnormality. Minimal chronic small vessel ischemic changes within the cerebral white matter. Tiny chronic infarcts within the right cerebellar hemisphere. Mild generalized cerebral atrophy." Consulted with RN. RN noted pt taking pills whole with water without difficulty. Spoke with pt. Pt denies s/sx dysphagia. Pt consumed 100% of breakfast. Pt A&Ox4. Speech is fluent, appropriate, and without s/sx anomia or dysarthria. NIH stroke scale = 0. SLP to sign off as pt has no acute SLP needs. Pt and RN aware and in agreement with SLP POC.   Cherrie Gauze, M.S., [facility] [phone] ([place])  Quintella Baton 12/21/2021, 9:22 AM

## 2021-12-21 NOTE — Discharge Summary (Signed)
Physician Discharge Summary   Christopher [name]:[id number] DOB: 1932-05-06 DOA: 12/20/2021  PCP: Sofie Hartigan, MD  Admit date: 12/20/2021 Discharge date:  12/21/2021  Admitted From: Home Disposition:  Home Discharging physician: Dwyane Dee, MD  Recommendations for Outpatient Follow-up:  Follow up with neurology  Home Health:  Equipment/Devices:   Discharge Condition: stable CODE STATUS: Full Diet recommendation:  Diet Orders (From admission, onward)     Start     Ordered   12/21/21 0000  Diet - low sodium heart healthy        12/21/21 1535   12/21/21 0000  Diet Carb Modified        12/21/21 1535   12/20/21 2118  Diet heart healthy/carb modified Room service appropriate? Yes; Fluid consistency: Thin  Diet effective now       Question Answer Comment  Diet-HS Snack? Nothing   Room service appropriate? Yes   Fluid consistency: Thin      12/20/21 2117            Hospital Course: Christopher [name] is a 88 y.o. male with medical history significant for paroxysmal A-fib not anticoagulated as it was felt to be a transient episode post op, CAD s/p CABG, colonic polyps, recent anal fissure on colonoscopy 12/17/21, HTN, HLD, DMII, PVD, diabetic polyneuropathy, GERD who presented initially to the ER with sudden onset dizziness.  There was also some reported associated dysarthria. He has been compliant with his chronic aspirin but recently had stopped it for the colonoscopy and had just resumed it on 12/19/2021. He was admitted for stroke work-up.  MRI brain was negative for acute abnormalities.  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  CT angio head/neck showed calcifications along the aortic arch and bilateral carotid bifurcations without significant stenosis in the neck.  Moderate distal small vessel disease without significant proximal stenosis.  Stable substernal goiter.  A1c returned at 7.9% and his metformin was also increased to 1000 mg twice  daily.  He was evaluated by neurology and started on aspirin and Plavix for 21 days followed by Plavix monotherapy. He also was evaluated by cardiology and recommended to be placed on a heart monitor as well which will be mailed to the patient after discharge.  The patient's chronic medical conditions were treated accordingly per the patient's home medication regimen except as noted.  On day of discharge, patient was felt deemed stable for discharge. Patient/family member advised to call PCP or come back to ER if needed.   Principal Diagnosis: Dizziness  Discharge Diagnoses: Active Hospital Problems   Diagnosis Date Noted   Dizziness 12/21/2021    Resolved Hospital Problems  No resolved problems to display.     Discharge Instructions     Diet - low sodium heart healthy   Complete by: As directed    Diet Carb Modified   Complete by: As directed    Increase activity slowly   Complete by: As directed       Allergies as of 12/21/2021   No Known Allergies      Medication List     STOP taking these medications    celecoxib 200 MG capsule Commonly known as: CELEBREX   lidocaine 2 % solution Commonly known as: XYLOCAINE   omeprazole 20 MG capsule Commonly known as: PRILOSEC       TAKE these medications    aspirin EC 81 MG tablet Take 1 tablet (81 mg total) by mouth daily for 21 days.  cholecalciferol 25 MCG (1000 UNIT) tablet Commonly known as: VITAMIN D3 Take 1,000 Units by mouth daily.   clopidogrel 75 MG tablet Commonly known as: PLAVIX Take 1 tablet (75 mg total) by mouth daily. Start taking on: December 22, 2021   gabapentin 100 MG capsule Commonly known as: NEURONTIN Take 100 mg by mouth 3 (three) times daily.   lisinopril 2.5 MG tablet Commonly known as: ZESTRIL Take 2.5 mg by mouth daily.   Magnesium 250 MG Tabs Take by mouth daily.   metFORMIN 500 MG 24 hr tablet Commonly known as: GLUCOPHAGE-XR Take 2 tablets (1,000 mg total) by mouth 2 (two)  times daily with a meal. What changed: when to take this   metoprolol tartrate 25 MG tablet Commonly known as: LOPRESSOR Take 0.5 tablets (12.5 mg total) by mouth 2 (two) times daily.   rosuvastatin 5 MG tablet Commonly known as: CRESTOR Take 5 mg by mouth daily.        Follow-up Information     Garvin Fila, MD Follow up in 4 week(s).   Specialties: Neurology, Radiology Contact information: [address] [place] [address] [phone]         Theora Gianotti, NP Follow up on 02/03/2022.   Specialties: Nurse Practitioner, Cardiology, Radiology Why: 8 AM (Dr. Donivan Scull Nurse Practitioner)  **We will be mailing you a heart monitor to wear** Contact information: [place] [address] [phone]         Sofie Hartigan, MD .   Specialty: Family Medicine Contact information: [place] [address] [phone]         Alric Ran, MD .   Specialty: Neurology Contact information: [place] [address] [phone]                No Known Allergies  Consultations: Neurology Cardiology  Procedures:   Discharge Exam: BP (!) 142/84 (BP Location: Left Arm)   Pulse (!) 110   Temp 98.8 F (37.1 C)   Resp 18   Ht '5\' 11"'$  (1.803 m)   Wt 91.6 kg   SpO2 98%   BMI 28.17 kg/m  Physical Exam Constitutional:      General: He is not in acute distress.    Appearance: Normal appearance.  HENT:     Head: Normocephalic and atraumatic.     Mouth/Throat:     Mouth: Mucous membranes are moist.  Eyes:     Extraocular Movements: Extraocular movements intact.  Cardiovascular:     Rate and Rhythm: Normal rate and regular rhythm.     Heart sounds: Normal heart sounds.  Pulmonary:     Effort: Pulmonary effort is normal. No respiratory distress.     Breath sounds: Normal breath sounds. No wheezing.  Abdominal:     General: Bowel sounds are normal. There is  no distension.     Palpations: Abdomen is soft.     Tenderness: There is no abdominal tenderness.  Musculoskeletal:        General: Normal range of motion.     Cervical back: Normal range of motion and neck supple.  Skin:    General: Skin is warm and dry.  Neurological:     General: No focal deficit present.     Mental Status: He is alert.  Psychiatric:        Mood and Affect: Mood normal.        Behavior: Behavior normal.  The results of significant diagnostics from this hospitalization (including imaging, microbiology, ancillary and laboratory) are listed below for reference.   Microbiology: No results found for this or any previous visit (from the past 240 hour(s)).   Labs: BNP (last 3 results) No results for input(s): "BNP" in the last 8760 hours. Basic Metabolic Panel: Recent Labs  Lab 12/15/21 1257 12/20/21 0941 12/21/21 0610  NA 133* 138 140  K 4.6 4.0 4.2  CL 101 108 106  CO2 '27 24 26  '$ GLUCOSE 255* 181* 194*  BUN '22 16 13  '$ CREATININE 1.06 1.05 0.89  CALCIUM 8.8* 8.2* 9.0  MG  --   --  2.2  PHOS  --   --  3.2   Liver Function Tests: No results for input(s): "AST", "ALT", "ALKPHOS", "BILITOT", "PROT", "ALBUMIN" in the last 168 hours. No results for input(s): "LIPASE", "AMYLASE" in the last 168 hours. No results for input(s): "AMMONIA" in the last 168 hours. CBC: Recent Labs  Lab 12/15/21 1257 12/20/21 0941 12/21/21 0610  WBC 8.5 7.4 5.7  NEUTROABS 6.3  --   --   HGB 14.7 13.4 14.1  HCT 42.0 39.6 41.6  MCV 91.9 92.1 90.6  PLT 196 103* 101*   Cardiac Enzymes: No results for input(s): "CKTOTAL", "CKMB", "CKMBINDEX", "TROPONINI" in the last 168 hours. BNP: Invalid input(s): "POCBNP" CBG: No results for input(s): "GLUCAP" in the last 168 hours. D-Dimer No results for input(s): "DDIMER" in the last 72 hours. Hgb A1c Recent Labs    12/20/21 0941  HGBA1C 7.9*   Lipid Profile Recent Labs    12/21/21 0610  CHOL 130  HDL 42  LDLCALC 54   TRIG 168*  CHOLHDL 3.1   Thyroid function studies No results for input(s): "TSH", "T4TOTAL", "T3FREE", "THYROIDAB" in the last 72 hours.  Invalid input(s): "FREET3" Anemia work up No results for input(s): "VITAMINB12", "FOLATE", "FERRITIN", "TIBC", "IRON", "RETICCTPCT" in the last 72 hours. Urinalysis    Component Value Date/Time   COLORURINE STRAW (A) 12/20/2021 0941   APPEARANCEUR CLEAR (A) 12/20/2021 0941   APPEARANCEUR Clear 05/30/2013 1402   LABSPEC 1.004 (L) 12/20/2021 0941   LABSPEC 1.027 05/30/2013 1402   PHURINE 6.0 12/20/2021 0941   GLUCOSEU 50 (A) 12/20/2021 0941   GLUCOSEU >=500 05/30/2013 1402   HGBUR SMALL (A) 12/20/2021 0941   BILIRUBINUR NEGATIVE 12/20/2021 0941   BILIRUBINUR Negative 05/30/2013 1402   KETONESUR NEGATIVE 12/20/2021 0941   PROTEINUR NEGATIVE 12/20/2021 0941   UROBILINOGEN 1.0 09/19/2014 1337   NITRITE NEGATIVE 12/20/2021 0941   LEUKOCYTESUR SMALL (A) 12/20/2021 0941   LEUKOCYTESUR Negative 05/30/2013 1402   Sepsis Labs Recent Labs  Lab 12/15/21 1257 12/20/21 0941 12/21/21 0610  WBC 8.5 7.4 5.7   Microbiology No results found for this or any previous visit (from the past 240 hour(s)).  Procedures/Studies: ECHOCARDIOGRAM COMPLETE BUBBLE STUDY  Result Date: 12/21/2021    ECHOCARDIOGRAM REPORT   Patient Name:   Christopher [name] Date of Exam: 12/21/2021 Medical Rec #:  [id number]    Height:       71.0 in Accession #:    [id number]   Weight:       201.9 lb Date of Birth:  02-12-1932    BSA:          2.117 m Patient Age:    62 years     BP:           157/79 mmHg Patient Gender: M  HR:           66 bpm. Exam Location:  [facility] Procedure: 2D Echo, Cardiac Doppler, Color Doppler and Saline Contrast Bubble            Study Indications:     I63.9 Stroke  History:         Patient has prior history of Echocardiogram examinations, most                  recent 04/15/2021. CAD, Prior CABG, Arrythmias:Atrial                  Fibrillation; Risk  Factors:Former Smoker, Dyslipidemia and                  Hypertension.  Sonographer:     Rosalia Hammers Referring Phys:  [id number] [place] Diagnosing Phys: Kathlyn Sacramento MD IMPRESSIONS  1. Left ventricular ejection fraction, by estimation, is 55 to 60%. The left ventricle has normal function. The left ventricle has no regional wall motion abnormalities. There is mild left ventricular hypertrophy. Left ventricular diastolic parameters are consistent with Grade I diastolic dysfunction (impaired relaxation).  2. Right ventricular systolic function is normal. The right ventricular size is normal. Tricuspid regurgitation signal is inadequate for assessing PA pressure.  3. Left atrial size was mildly dilated.  4. The mitral valve is normal in structure. No evidence of mitral valve regurgitation. No evidence of mitral stenosis.  5. The aortic valve is normal in structure. Aortic valve regurgitation is not visualized. Aortic valve sclerosis is present, with no evidence of aortic valve stenosis.  6. The inferior vena cava is normal in size with greater than 50% respiratory variability, suggesting right atrial pressure of 3 mmHg.  7. Agitated saline contrast bubble study was negative, with no evidence of any interatrial shunt. FINDINGS  Left Ventricle: Left ventricular ejection fraction, by estimation, is 55 to 60%. The left ventricle has normal function. The left ventricle has no regional wall motion abnormalities. The left ventricular internal cavity size was normal in size. There is  mild left ventricular hypertrophy. Left ventricular diastolic parameters are consistent with Grade I diastolic dysfunction (impaired relaxation). Right Ventricle: The right ventricular size is normal. No increase in right ventricular wall thickness. Right ventricular systolic function is normal. Tricuspid regurgitation signal is inadequate for assessing PA pressure. Left Atrium: Left atrial size was mildly dilated. Right Atrium: Right  atrial size was normal in size. Pericardium: There is no evidence of pericardial effusion. Mitral Valve: The mitral valve is normal in structure. No evidence of mitral valve regurgitation. No evidence of mitral valve stenosis. Tricuspid Valve: The tricuspid valve is normal in structure. Tricuspid valve regurgitation is trivial. No evidence of tricuspid stenosis. Aortic Valve: The aortic valve is normal in structure. Aortic valve regurgitation is not visualized. Aortic valve sclerosis is present, with no evidence of aortic valve stenosis. Aortic valve mean gradient measures 4.0 mmHg. Aortic valve peak gradient measures 6.7 mmHg. Aortic valve area, by VTI measures 1.68 cm. Pulmonic Valve: The pulmonic valve was normal in structure. Pulmonic valve regurgitation is not visualized. No evidence of pulmonic stenosis. Aorta: The aortic root is normal in size and structure. Venous: The inferior vena cava is normal in size with greater than 50% respiratory variability, suggesting right atrial pressure of 3 mmHg. IAS/Shunts: No atrial level shunt detected by color flow Doppler. Agitated saline contrast was given intravenously to evaluate for intracardiac shunting. Agitated saline contrast bubble study was negative, with no  evidence of any interatrial shunt.  LEFT VENTRICLE PLAX 2D LVIDd:         4.98 cm   Diastology LVIDs:         3.55 cm   LV e' medial:    4.64 cm/s LV PW:         1.46 cm   LV E/e' medial:  13.9 LV IVS:        1.10 cm   LV e' lateral:   5.66 cm/s LVOT diam:     1.90 cm   LV E/e' lateral: 11.4 LV SV:         43 LV SV Index:   20 LVOT Area:     2.84 cm  RIGHT VENTRICLE RV Basal diam:  3.29 cm RV S prime:     9.02 cm/s TAPSE (M-mode): 1.4 cm LEFT ATRIUM             Index        RIGHT ATRIUM           Index LA diam:        4.20 cm 1.98 cm/m   RA Area:     14.70 cm LA Vol (A2C):   58.5 ml 27.63 ml/m  RA Volume:   33.20 ml  15.68 ml/m LA Vol (A4C):   58.7 ml 27.72 ml/m LA Biplane Vol: 59.8 ml 28.24 ml/m   AORTIC VALVE AV Area (Vmax):    1.59 cm AV Area (Vmean):   1.51 cm AV Area (VTI):     1.68 cm AV Vmax:           129.00 cm/s AV Vmean:          93.100 cm/s AV VTI:            0.258 m AV Peak Grad:      6.7 mmHg AV Mean Grad:      4.0 mmHg LVOT Vmax:         72.50 cm/s LVOT Vmean:        49.600 cm/s LVOT VTI:          0.153 m LVOT/AV VTI ratio: 0.59  AORTA Ao Root diam: 3.40 cm MITRAL VALVE MV Area (PHT): 3.70 cm     SHUNTS MV Decel Time: 205 msec     Systemic VTI:  0.15 m MV E velocity: 64.30 cm/s   Systemic Diam: 1.90 cm MV A velocity: 101.00 cm/s MV E/A ratio:  0.64 Kathlyn Sacramento MD Electronically signed by Kathlyn Sacramento MD Signature Date/Time: 12/21/2021/12:39:47 PM    Final    MR BRAIN WO CONTRAST  Result Date: 12/20/2021 CLINICAL DATA:  Provided history: Dizziness, persistent/recurrent, cardiac or vascular cause suspected. EXAM: MRI HEAD WITHOUT CONTRAST TECHNIQUE: Multiplanar, multiecho pulse sequences of the brain and surrounding structures were obtained without intravenous contrast. COMPARISON:  CT angiogram head/neck and non-contrast head CT performed earlier today 12/20/2021. FINDINGS: Brain: Mild generalized cerebral atrophy. Multifocal T2 FLAIR hyperintense signal abnormality within the cerebral white matter, nonspecific but compatible with minimal chronic small vessel ischemic disease. Few scattered punctate supratentorial chronic parenchymal microhemorrhages. Tiny chronic infarcts within the right cerebellar hemisphere. There is no acute infarct. No evidence of an intracranial mass. No extra-axial fluid collection. No midline shift. Vascular: Maintained flow voids within the proximal large arterial vessels. Skull and upper cervical spine: No focal suspicious marrow lesion. Incompletely assessed cervical spondylosis. Sinuses/Orbits: No mass or acute finding within the imaged orbits. Prior bilateral ocular lens replacement. IMPRESSION: No evidence of acute  intracranial abnormality. Minimal  chronic small vessel ischemic changes within the cerebral white matter. Tiny chronic infarcts within the right cerebellar hemisphere. Mild generalized cerebral atrophy. Electronically Signed   By: Kellie Simmering D.O.   On: 12/20/2021 15:26   CT ANGIO HEAD NECK W WO CM  Result Date: 12/20/2021 CLINICAL DATA:  Dizziness, persistent/recurrent, cardiac or vascular cause suspected. EXAM: CT ANGIOGRAPHY HEAD AND NECK TECHNIQUE: Multidetector CT imaging of the head and neck was performed using the standard protocol during bolus administration of intravenous contrast. Multiplanar CT image reconstructions and MIPs were obtained to evaluate the vascular anatomy. Carotid stenosis measurements (when applicable) are obtained utilizing NASCET criteria, using the distal internal carotid diameter as the denominator. RADIATION DOSE REDUCTION: This exam was performed according to the departmental dose-optimization program which includes automated exposure control, adjustment of the mA and/or kV according to patient size and/or use of iterative reconstruction technique. CONTRAST:  68m OMNIPAQUE IOHEXOL 350 MG/ML SOLN COMPARISON:  CT head without contrast 12/08/2007 FINDINGS: CT HEAD FINDINGS Brain: Mild atrophy and white matter disease demonstrates interval progression. No acute infarct, hemorrhage, or mass lesion is present. Basal ganglia are intact. Insular ribbon is normal bilaterally. No acute or porta scratched at no acute or focal cortical abnormalities are present. The brainstem and cerebellum are within normal limits. Vascular: No hyperdense vessel or unexpected calcification. Skull: Previously described lucencies in the calvarium are stable and benign. No follow-up necessary. No acute abnormality is present. No significant extracranial soft tissue lesion is present. Sinuses: The paranasal sinuses and mastoid air cells are clear. Orbits: The globes and orbits are within normal limits. Review of the MIP images confirms the  above findings CTA NECK FINDINGS Aortic arch: Atherosclerotic calcifications present at the aortic arch great vessel origins. No significant stenosis or aneurysm present. Right carotid system: Right common carotid artery demonstrates minimal atherosclerotic change. Atherosclerotic calcifications are present at the bifurcation significant stenosis. Cervical right ICA is otherwise. Left carotid system: The left common carotid artery demonstrates minimal mural calcifications without significant stenosis. Minimal atherosclerotic changes are present the carotid bifurcation. No significant stenosis is present. The cervical left ICA is. Vertebral arteries: The vertebral arteries are codominant. Both vertebral arteries originate the subclavian arteries without significant stenosis. No significant stenosis present either vertebral artery in the neck. Skeleton: Multilevel degenerative changes are present throughout the cervical spine. No focal osseous lesions are present. Other neck: Soft tissues the neck are otherwise unremarkable. Salivary glands are within normal limits. Thyroid is normal. No significant adenopathy is present. No focal mucosal or submucosal lesions are present. Upper chest: None substernal goiter is stable since 2014. Recommend follow-up the lung apices are clear. Thoracic inlet is otherwise within normal limits. Review of the MIP images confirms the above findings CTA HEAD FINDINGS Anterior circulation: Atherosclerotic changes are present within the cavernous internal carotid arteries bilaterally without significant stenosis through the ICA termini. The left A1 segment is dominant. The A1 and M1 segments are within normal limits. MCA bifurcations are intact. The anterior communicating artery is patent. Moderate attenuation of distal ACA and MCA branch vessels is present without a significant proximal stenosis or occlusion. Posterior circulation: The vertebral arteries are codominant. The right PICA origin  is visualized and within normal limits. Left AICA is dominant. The vertebrobasilar junction and basilar artery is normal. Both posterior cerebral arteries originate from basilar tip. Moderate attenuation of distal PCA branch vessels is present without a significant proximal stenosis or occlusion Venous sinuses: The dural sinuses are patent.  The straight sinus deep cerebral veins are intact. Cortical veins are within normal limits. No significant vascular malformation is evident. Anatomic variants: None Review of the MIP images confirms the above findings IMPRESSION: 1. Moderate distal small vessel disease without a significant proximal stenosis, aneurysm, or branch vessel occlusion within the Circle of Willis. 2. Atherosclerotic calcifications at the aortic arch and bilateral carotid bifurcations without significant stenosis in the neck 3. Multilevel spondylosis of the cervical spine. 4. Stable substernal goiter. 5. Aortic Atherosclerosis (ICD10-I70.0). Electronically Signed   By: San Morelle M.D.   On: 12/20/2021 11:41   CT PELVIS W CONTRAST  Result Date: 11/30/2021 CLINICAL DATA:  Prostatitis EXAM: CT PELVIS WITH CONTRAST TECHNIQUE: Multidetector CT imaging of the pelvis was performed using the standard protocol following the bolus administration of intravenous contrast. RADIATION DOSE REDUCTION: This exam was performed according to the departmental dose-optimization program which includes automated exposure control, adjustment of the mA and/or kV according to patient size and/or use of iterative reconstruction technique. CONTRAST:  145m OMNIPAQUE IOHEXOL 300 MG/ML  SOLN COMPARISON:  CT abdomen and pelvis dated July 04, 2014 FINDINGS: Urinary Tract:  No abnormality visualized. Bowel: Diverticulosis. No wall thickening, inflammatory change or dilation of the visualized bowel. Vascular/Lymphatic: No pathologically enlarged lymph nodes. Aortoiliac atherosclerotic disease. Reproductive: Prostatomegaly,  measuring up to 6.2 cm. No evidence of prostatic abscess. Other: Small fat containing left inguinal hernia. No pelvic ascites. Musculoskeletal: No suspicious bone lesions identified. IMPRESSION: 1. Prostatomegaly with no CT evidence of prostate abscess. 2.  Aortic Atherosclerosis (ICD10-I70.0). Electronically Signed   By: LYetta GlassmanM.D.   On: 11/30/2021 10:27     Time coordinating discharge: Over 30 minutes    DDwyane Dee MD  [facility] 12/21/2021, 4:18 PM

## 2021-12-21 NOTE — Progress Notes (Signed)
OT Cancellation Note  Patient Details Name: Christopher [name] MRN: [id number] DOB: [birth year]   Cancelled Treatment:    Reason Eval/Treat Not Completed: OT screened, no needs identified, will sign off. Pt able to complete bed mobility, dressing, toileting INDly. Reports being at baseline level of fxl mobility. Provided educ re: stroke awareness, importance of rapid medical attention with onset of symptoms; pt and family verbalized understanding. No OT needs at present. Will sign off.  Josiah Lobo 12/21/2021, 10:26 AM

## 2021-12-21 NOTE — Care Management Obs Status (Signed)
[facility] NOTIFICATION   Patient Details  Name: Christopher [name] MRN: [id number] Date of Birth: 05/14/1932   Medicare Observation Status Notification Given:  Yes    Beverly Sessions, RN 12/21/2021, 4:06 PM

## 2021-12-21 NOTE — Evaluation (Signed)
Physical Therapy Evaluation Patient Details Name: Christopher [name] MRN: [id number] DOB: 1931-10-16 Today's Date: 12/21/2021  History of Present Illness  Pt is an 88 y.o. male presenting to hospital 7/23 with c/o transient dizziness.  Pt admitted with suspected CVA.  L sided dysarthria noted in ED.  PMH includes a-fib, NSTEMI, htn, atherosclerosis, CABG with stent placement, diabetic polyneuroapathy, PVD, and B TKA.  Recent colonoscopy 12/17/21.  Clinical Impression  Prior to hospital admission, pt was independent with functional mobility and active; lives with his wife in 1 level home.  Currently pt is independent with bed mobility, transfers, and ambulation 400 feet (no AD use), pt modified independent navigating 6 steps with UE support.  No loss of balance noted with functional mobility and/or dynamic standing activities.  Pt's HR 80-103 bpm (a-fib rhythm noted on monitor) during functional mobility.  Pt appears to be at baseline level of functional mobility (per pt report); no acute PT needs identified; will sign off (pt in agreement).   Recommendations for follow up therapy are one component of a multi-disciplinary discharge planning process, led by the attending physician.  Recommendations may be updated based on patient status, additional functional criteria and insurance authorization.  Follow Up Recommendations No PT follow up      Assistance Recommended at Discharge None  Patient can return home with the following       Equipment Recommendations None recommended by PT  Recommendations for Other Services       Functional Status Assessment Patient has not had a recent decline in their functional status     Precautions / Restrictions Precautions Precautions: None Restrictions Weight Bearing Restrictions: No      Mobility  Bed Mobility Overal bed mobility: Independent             General bed mobility comments: no difficulties noted supine to/from sitting     Transfers Overall transfer level: Independent Equipment used: None               General transfer comment: steady safe transfers noted    Ambulation/Gait Ambulation/Gait assistance: Independent Gait Distance (Feet): 400 Feet Assistive device: None Gait Pattern/deviations: WFL(Within Functional Limits)       General Gait Details: steady step through gait pattern  Stairs Stairs: Yes Stairs assistance: Modified independent (Device/Increase time) Stair Management: One rail Right, Alternating pattern, Forwards Number of Stairs: 6 General stair comments: steady safe stairs navigation  Wheelchair Mobility    Modified Rankin (Stroke Patients Only)       Balance Overall balance assessment: Independent Sitting-balance support: No upper extremity supported, Feet supported Sitting balance-Leahy Scale: Normal Sitting balance - Comments: steady sitting reaching outside BOS   Standing balance support: No upper extremity supported, During functional activity Standing balance-Leahy Scale: Normal Standing balance comment: steady ambulating in hallway               High Level Balance Comments: No loss of balance with ambulation (with head turns R/L/up/down, increasing/decreasing speed, and turning 180 degrees and stopping)             Pertinent Vitals/Pain Pain Assessment Pain Assessment: No/denies pain    Home Living Family/patient expects to be discharged to:: Private residence Living Arrangements: Spouse/significant other Available Help at Discharge: Family Type of Home: House Home Access: Stairs to enter;Ramped entrance Entrance Stairs-Rails: Right;Left;Can reach both Entrance Stairs-Number of Steps: 3 (from garage)   Home Layout: One level Home Equipment: None      Prior Function Prior Level of  Function : Independent/Modified Independent             Mobility Comments[place]  Likes to travel.       Hand Dominance         Extremity/Trunk Assessment   Upper Extremity Assessment Upper Extremity Assessment: Overall WFL for tasks assessed    Lower Extremity Assessment Lower Extremity Assessment:  (B LE strength hip flexion, knee flexion/extension, and DF 5/5.  Intact B LE proprioception, tone, heel to shin coordination, and light touch sensation.)    Cervical / Trunk Assessment Cervical / Trunk Assessment: Normal  Communication   Communication: HOH  Cognition Arousal/Alertness: Awake/alert Behavior During Therapy: WFL for tasks assessed/performed Overall Cognitive Status: Within Functional Limits for tasks assessed                                          General Comments  Nursing cleared pt for participation in physical therapy.  Pt agreeable to PT session.  Pt's daughter and pt's wife present during session.    Exercises     Assessment/Plan    PT Assessment Patient does not need any further PT services  PT Problem List         PT Treatment Interventions      PT Goals (Current goals can be found in the Care Plan section)  Acute Rehab PT Goals Patient Stated Goal: to go home PT Goal Formulation: With patient Time For Goal Achievement: 01/04/22 Potential to Achieve Goals: Good    Frequency       Co-evaluation               AM-PAC PT "6 Clicks" Mobility  Outcome Measure Help needed turning from your back to your side while in a flat bed without using bedrails?: None Help needed moving from lying on your back to sitting on the side of a flat bed without using bedrails?: None Help needed moving to and from a bed to a chair (including a wheelchair)?: None Help needed standing up from a chair using your arms (e.g., wheelchair or bedside chair)?: None Help needed to walk in hospital room?: None Help needed climbing 3-5 steps with a railing? : None 6 Click Score: 24    End of Session   Activity Tolerance: Patient tolerated treatment well Patient left: in  bed;with call bell/phone within reach;with family/visitor present Nurse Communication: Mobility status;Precautions PT Visit Diagnosis: Difficulty in walking, not elsewhere classified (R26.2)    Time: 9390-3009 PT Time Calculation (min) (ACUTE ONLY): 19 min   Charges:   PT Evaluation $PT Eval Low Complexity: 1 Low         Braxtyn Bojarski, PT 12/21/21, 12:41 PM

## 2021-12-21 NOTE — Hospital Course (Addendum)
Christopher [name] is a 88 y.o. male with medical history significant for paroxysmal A-fib not anticoagulated as it was felt to be a transient episode post op, CAD s/p CABG, colonic polyps, recent anal fissure on colonoscopy 12/17/21, HTN, HLD, DMII, PVD, diabetic polyneuropathy, GERD who presented initially to the ER with sudden onset dizziness.  There was also some reported associated dysarthria. He has been compliant with his chronic aspirin but recently had stopped it for the colonoscopy and had just resumed it on 12/19/2021. He was admitted for stroke work-up.  MRI brain was negative for acute abnormalities.  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  CT angio head/neck showed calcifications along the aortic arch and bilateral carotid bifurcations without significant stenosis in the neck.  Moderate distal small vessel disease without significant proximal stenosis.  Stable substernal goiter.  A1c returned at 7.9% and his metformin was also increased to 1000 mg twice daily.  He was evaluated by neurology and started on aspirin and Plavix for 21 days followed by Plavix monotherapy. He also was evaluated by cardiology and recommended to be placed on a heart monitor as well which will be mailed to the patient after discharge.

## 2021-12-21 NOTE — Discharge Instructions (Addendum)
Metformin has been increased to 1000 mg twice a day to help aid with lowering your A1c further.  Cardiology will have the monitor mailed to you.  Continue aspirin and Plavix for 3 weeks then continue on plavix alone.  Follow-up with stroke clinic in 4 to 6 weeks

## 2021-12-21 NOTE — Progress Notes (Signed)
Patient being discharged home. IV removed. Went over discharge instructions with patient and patients family as well as medications. All questions answered and they all stated they understood. Patient going home POV with family.

## 2021-12-21 NOTE — Progress Notes (Signed)
SLP Cancellation Note  Patient Details Name: Christopher [name] MRN: [id number] DOB: 07/25/1931   Cancelled treatment:       Reason Eval/Treat Not Completed: Medical issues which prohibited therapy;Patient at procedure or test/unavailable. Clinical swallowing evaluation deferred at this time as pt currently undergoing echocardiogram. Will continue efforts as appropriate.  Cherrie Gauze, M.S., [facility] [phone] ([place])  Quintella Baton 12/21/2021, 8:22 AM

## 2021-12-22 ENCOUNTER — Encounter: Payer: Self-pay | Admitting: *Deleted

## 2021-12-22 ENCOUNTER — Telehealth: Payer: Self-pay | Admitting: Nurse Practitioner

## 2021-12-22 DIAGNOSIS — R55 Syncope and collapse: Secondary | ICD-10-CM

## 2021-12-22 DIAGNOSIS — G459 Transient cerebral ischemic attack, unspecified: Secondary | ICD-10-CM

## 2021-12-22 NOTE — Progress Notes (Signed)
Patient ID: Christopher [name], male   DOB: 03-09-1932, 88 y.o.   MRN: [id number] Patient enrolled for [employer] to ship a 30 day cardiac event monitor to his home.  Letter with instructions mailed to patient.

## 2021-12-22 NOTE — Telephone Encounter (Signed)
Clarification from provider and [employer] monitor ordered. Spoke with Darrick Penna and she advised to select [address] location.

## 2021-12-22 NOTE — Telephone Encounter (Signed)
Request for monitor per secure chat with Murray Hodgkins NP. Waiting on confirmation with provider regarding regular Zio XT or Zio AT  Christopher [name]! This guy is probably going home from [facility] today after TIA.  Neuro would like a 30 day event monitor.  I'm fine w/ 2 zio's or [employer] (or whatever we're using these days).  Annia Belt won't be able to place [place] here.  Would you pls help to get him set up for one to be mailed to him?  Indication is TIA r/o Afib.

## 2021-12-22 NOTE — Telephone Encounter (Signed)
Prescription was sent to [facility] and patient picked it up on 12/17/2021.

## 2021-12-22 NOTE — Telephone Encounter (Signed)
Left voicemail message to call back for review of recommendations.  

## 2021-12-23 ENCOUNTER — Other Ambulatory Visit: Payer: Self-pay

## 2021-12-23 ENCOUNTER — Encounter: Payer: Self-pay | Admitting: Emergency Medicine

## 2021-12-23 ENCOUNTER — Emergency Department
Admission: EM | Admit: 2021-12-23 | Discharge: 2021-12-23 | Disposition: A | Discharge status: Home / Self Care | Payer: Medicare Other | Attending: Emergency Medicine | Admitting: Emergency Medicine

## 2021-12-23 DIAGNOSIS — R319 Hematuria, unspecified: Secondary | ICD-10-CM | POA: Diagnosis present

## 2021-12-23 DIAGNOSIS — R310 Gross hematuria: Secondary | ICD-10-CM

## 2021-12-23 DIAGNOSIS — Z951 Presence of aortocoronary bypass graft: Secondary | ICD-10-CM | POA: Insufficient documentation

## 2021-12-23 DIAGNOSIS — Z7901 Long term (current) use of anticoagulants: Secondary | ICD-10-CM | POA: Insufficient documentation

## 2021-12-23 DIAGNOSIS — R300 Dysuria: Secondary | ICD-10-CM | POA: Diagnosis not present

## 2021-12-23 DIAGNOSIS — Z7982 Long term (current) use of aspirin: Secondary | ICD-10-CM | POA: Insufficient documentation

## 2021-12-23 DIAGNOSIS — I2510 Atherosclerotic heart disease of native coronary artery without angina pectoris: Secondary | ICD-10-CM | POA: Diagnosis not present

## 2021-12-23 DIAGNOSIS — Z7902 Long term (current) use of antithrombotics/antiplatelets: Secondary | ICD-10-CM | POA: Diagnosis not present

## 2021-12-23 LAB — COMPREHENSIVE METABOLIC PANEL
ALT: 19 U/L (ref 0–44)
AST: 18 U/L (ref 15–41)
Albumin: 4 g/dL (ref 3.5–5.0)
Alkaline Phosphatase: 48 U/L (ref 38–126)
Anion gap: 8 (ref 5–15)
BUN: 20 mg/dL (ref 8–23)
CO2: 26 mmol/L (ref 22–32)
Calcium: 8.9 mg/dL (ref 8.9–10.3)
Chloride: 102 mmol/L (ref 98–111)
Creatinine, Ser: 1.17 mg/dL (ref 0.61–1.24)
GFR, Estimated: >60 mL/min (ref >60)
Glucose, Bld: 181 mg/dL — ABNORMAL HIGH (ref 70–99)
Potassium: 4.2 mmol/L (ref 3.5–5.1)
Sodium: 136 mmol/L (ref 135–145)
Total Bilirubin: 0.6 mg/dL (ref 0.3–1.2)
Total Protein: 6.8 g/dL (ref 6.5–8.1)

## 2021-12-23 LAB — URINALYSIS, ROUTINE W REFLEX MICROSCOPIC
Bacteria, UA: NONE SEEN
RBC / HPF: >50 RBC/hpf — ABNORMAL HIGH (ref 0–5)
Specific Gravity, Urine: 1.012 (ref 1.005–1.030)
Squamous Epithelial / HPF: NONE SEEN (ref 0–5)
WBC, UA: >50 WBC/hpf — ABNORMAL HIGH (ref 0–5)

## 2021-12-23 LAB — TYPE AND SCREEN
ABO/RH(D): B NEG
Antibody Screen: NEGATIVE

## 2021-12-23 LAB — CBC
HCT: 43.5 % (ref 39.0–52.0)
Hemoglobin: 14.7 g/dL (ref 13.0–17.0)
MCH: 31 pg (ref 26.0–34.0)
MCHC: 33.8 g/dL (ref 30.0–36.0)
MCV: 91.8 fL (ref 80.0–100.0)
Platelets: 103 10*3/uL — ABNORMAL LOW (ref 150–400)
RBC: 4.74 MIL/uL (ref 4.22–5.81)
RDW: 14.1 % (ref 11.5–15.5)
WBC: 9.7 10*3/uL (ref 4.0–10.5)
nRBC: 0 % (ref 0.0–0.2)

## 2021-12-23 MED ORDER — CEFDINIR 300 MG PO CAPS
300.0000 mg | ORAL_CAPSULE | Freq: Once | ORAL | Status: AC
  Administered 2021-12-23: 300 mg via ORAL
  Filled 2021-12-23: qty 1

## 2021-12-23 MED ORDER — CEFDINIR 300 MG PO CAPS: 300.0000 mg | ORAL_CAPSULE | Freq: Two times a day (BID) | ORAL | 0 refills | Status: AC

## 2021-12-23 NOTE — Telephone Encounter (Signed)
Called patient back and informed him that a [employer] monitor will be mailed out and he should receive it in 3-5 days. I reviewed the instructions with him that are in the letter tab (letter was sent out to patient yesterday) and gave him the number to [employer].   Patient also informed me that this morning he had some blood in his urine, and had some burning. He was restarted on Plavix during hospital stay. He did go to the ER, labs looked WNL, they wanted to set him up with a 3-way foley, but patient opted to return home and increase fluids. Plavix was continued for now. Patient did report that as soon as he got home he urinated without burning and urine was "crystal clear". Patient is aware to continue his Aspirin and Plavix, and to report any more bleeding episodes.   Patient verbalized understanding and agreed with plan.  Copied from ER note this morning:  88 year old male presents to the ED with gross hematuria in the setting of new dysuria and recently starting Plavix, concerning for possibly hemorrhagic cystitis and ultimately suitable for outpatient management with close urology follow-up.  He look systemically well.  Mild abdominal tenderness on exam.  No signs of sepsis or SIRS criteria.  Normal CBC and CMP.  Urine with gross hematuria.  Considering his coexisting acute cystitis and suprapubic tenderness, concerned about acute cystitis.  We discussed possibly placing a three-way Foley for irrigation but he declines which I think is reasonable considering his lack of retention.  Bladder scan without significant urinary retention.  We discussed urine culture, few days of antibiotics and following up with urology as an outpatient.  Discussed return precautions.  Patient suitable for outpatient management. Less likely ureteral stone considering his lack of history of this, lack of abdominal pain.

## 2021-12-23 NOTE — ED Triage Notes (Signed)
Pt with family who report pt was recently d/c from hospital on Monday for TIA's and was started on Plavix. Pt woke this AM with hematuria, groin pain, urinary burning and passing of blood clots in urine.

## 2021-12-23 NOTE — ED Provider Notes (Signed)
[facility] Provider Note    Event Date/Time   First MD Initiated Contact with Patient 12/23/21 0601     (approximate)   History   Hematuria   HPI  Christopher [name] is a 88 y.o. male who presents to the ED for evaluation of Hematuria   I review DC summary from 7/24.  Admitted for possible stroke work-up in setting of sudden onset dizziness.  Negative MRI.  He has a history of paroxysmal A-fib not on anticoagulation as it was felt to be a transient postop episode.  His history of CAD s/p CABG.  While admitted they started him on DAPT, adding Plavix to his typical aspirin regimen.  Plan was to follow with Plavix monotherapy.   Patient presents to the ED with his wife and daughter for evaluation of dysuria, gross hematuria and passing blood clots in his urine for the past day.  Denies fevers or systemic symptoms.  Denies abdominal pain, emesis, diarrhea.  Denies any other bleeding symptoms such as epistaxis, hematochezia, bleeding gums.   Physical Exam   Triage Vital Signs: ED Triage Vitals [12/23/21 0554]  Enc Vitals Group     BP (!) 158/82     Pulse Rate 94     Resp 16     Temp 97.6 F (36.4 C)     Temp Source Oral     SpO2 96 %     Weight      Height      Head Circumference      Peak Flow      Pain Score      Pain Loc      Pain Edu?      Excl. in [place]?     Most recent vital signs: Vitals:   12/23/21 0554 12/23/21 0600  BP: (!) 158/82 (!) 163/101  Pulse: 94 71  Resp: 16 (!) 0  Temp: 97.6 F (36.4 C)   SpO2: 96% 93%    General: Awake, no distress.  CV:  Good peripheral perfusion.  Resp:  Normal effort.  Abd:  No distention.  Mild suprapubic tenderness, no peritoneal features.  Abdomen otherwise benign.  No CVA tenderness. MSK:  No deformity noted.  Neuro:  No focal deficits appreciated. Other:     ED Results / Procedures / Treatments   Labs (all labs ordered are listed, but only abnormal results are displayed) Labs Reviewed   URINALYSIS, ROUTINE W REFLEX MICROSCOPIC - Abnormal; Notable for the following components:      Result Value   Color, Urine RED (*)    APPearance BLOODY (*)    Glucose, UA   (*)    Value: TEST NOT REPORTED DUE TO COLOR INTERFERENCE OF URINE PIGMENT   Hgb urine dipstick   (*)    Value: TEST NOT REPORTED DUE TO COLOR INTERFERENCE OF URINE PIGMENT   Bilirubin Urine   (*)    Value: TEST NOT REPORTED DUE TO COLOR INTERFERENCE OF URINE PIGMENT   Ketones, ur   (*)    Value: TEST NOT REPORTED DUE TO COLOR INTERFERENCE OF URINE PIGMENT   Protein, ur   (*)    Value: TEST NOT REPORTED DUE TO COLOR INTERFERENCE OF URINE PIGMENT   Nitrite   (*)    Value: TEST NOT REPORTED DUE TO COLOR INTERFERENCE OF URINE PIGMENT   Leukocytes,Ua   (*)    Value: TEST NOT REPORTED DUE TO COLOR INTERFERENCE OF URINE PIGMENT   RBC / HPF >50 (*)  WBC, UA >50 (*)    All other components within normal limits  CBC - Abnormal; Notable for the following components:   Platelets 103 (*)    All other components within normal limits  COMPREHENSIVE METABOLIC PANEL - Abnormal; Notable for the following components:   Glucose, Bld 181 (*)    All other components within normal limits  URINE CULTURE  TYPE AND SCREEN    EKG   RADIOLOGY   Official radiology report(s): No results found.  PROCEDURES and INTERVENTIONS:  Procedures  Medications  cefdinir (OMNICEF) capsule 300 mg (has no administration in time range)     IMPRESSION / MDM / ASSESSMENT AND PLAN / ED COURSE  I reviewed the triage vital signs and the nursing notes.  Differential diagnosis includes, but is not limited to, acute cystitis, hemorrhagic cystitis, bladder cancer, ureteral stone, sepsis, coagulopathy  {Patient presents with symptoms of an acute illness or injury that is potentially life-threatening.  88 year old male presents to the ED with gross hematuria in the setting of new dysuria and recently starting Plavix, concerning for possibly  hemorrhagic cystitis and ultimately suitable for outpatient management with close urology follow-up.  He look systemically well.  Mild abdominal tenderness on exam.  No signs of sepsis or SIRS criteria.  Normal CBC and CMP.  Urine with gross hematuria.  Considering his coexisting acute cystitis and suprapubic tenderness, concerned about acute cystitis.  We discussed possibly placing a three-way Foley for irrigation but he declines which I think is reasonable considering his lack of retention.  Bladder scan without significant urinary retention.  We discussed urine culture, few days of antibiotics and following up with urology as an outpatient.  Discussed return precautions.  Patient suitable for outpatient management. Less likely ureteral stone considering his lack of history of this, lack of abdominal pain.  Clinical Course as of 12/23/21 0716  Wed Dec 23, 2021  0650 Reassessed.  Discussed my concerns for gross hematuria and my recommendations for three-way catheter placement for bladder irrigation prior to expected discharge and following up with urology.  Patient begins crying and says he does not want to do this.  He has been through so much this past month with medical illnesses, admissions for possible stroke and so many changes that he feels this would be overwhelming to him.  He is apologetic and agrees that he will call Dr. Erlene Quan, urology, later this morning to be seen soon as possible.  We discussed what to look out for at home and close return precautions for the ED. [DS]    Clinical Course User Index [DS] Vladimir Crofts, MD     FINAL CLINICAL IMPRESSION(S) / ED DIAGNOSES   Final diagnoses:  Gross hematuria     Rx / DC Orders   ED Discharge Orders          Ordered    cefdinir (OMNICEF) 300 MG capsule  2 times daily        12/23/21 0704             Note:  This document was prepared using Dragon voice recognition software and may include unintentional dictation errors.    Vladimir Crofts, MD 12/23/21 [phone]

## 2021-12-23 NOTE — Telephone Encounter (Signed)
Patient was returning call. Please advise ?

## 2021-12-23 NOTE — ED Notes (Signed)
Pt voided 150 ml of bloody urine, no clots noted. Bladder scan shows 25m post urination.

## 2021-12-23 NOTE — Discharge Instructions (Addendum)
Follow up with Dr. Erlene Quan. Return to the ED if you can't pee.

## 2021-12-23 NOTE — ED Notes (Signed)
Pt states he woke up with morning and noted large amount of blood in urine along with clots. Pt states he has burning on urination and frequency this am. Denies any fever or retention. Pt was recently admitted for TIA and put on plavix. Pt denies any pain.

## 2021-12-24 ENCOUNTER — Ambulatory Visit: Payer: Medicare Other | Attending: Nurse Practitioner

## 2021-12-24 DIAGNOSIS — G459 Transient cerebral ischemic attack, unspecified: Secondary | ICD-10-CM | POA: Diagnosis not present

## 2021-12-24 DIAGNOSIS — R55 Syncope and collapse: Secondary | ICD-10-CM | POA: Diagnosis not present

## 2021-12-24 LAB — URINE CULTURE: Culture: NO GROWTH

## 2022-01-04 ENCOUNTER — Telehealth: Payer: Self-pay | Admitting: Cardiovascular Disease

## 2022-01-04 NOTE — Telephone Encounter (Signed)
LMVM- Recommended patient call [employer] directly to troubleshoot monitor problems.  There number is [phone].  They will also be able to send you replacement parts if needed.

## 2022-01-04 NOTE — Telephone Encounter (Signed)
Pt is having trouble with his heart monitor. He says that it continues to tell him that it is not connected. Requesting call back.

## 2022-01-19 ENCOUNTER — Encounter: Payer: Self-pay | Admitting: Neurology

## 2022-01-19 ENCOUNTER — Ambulatory Visit (INDEPENDENT_AMBULATORY_CARE_PROVIDER_SITE_OTHER): Payer: Medicare Other | Admitting: Neurology

## 2022-01-19 VITALS — BP 123/70 | HR 64 | Ht 71.0 in | Wt 205.0 lb

## 2022-01-19 DIAGNOSIS — G459 Transient cerebral ischemic attack, unspecified: Secondary | ICD-10-CM | POA: Diagnosis not present

## 2022-01-19 NOTE — Patient Instructions (Signed)
Discontinue Aspirin and continue with Plavix  Continue your other medications  Follow up with cardiologist  Return in a year

## 2022-01-19 NOTE — Progress Notes (Signed)
[facility]  PATIENT: Christopher [name] DOB: 1932/05/23  REQUESTING CLINICIAN: Sofie Hartigan, MD HISTORY FROM: Patient, wife and chart review  REASON FOR VISIT: TIA    HISTORICAL  CHIEF COMPLAINT:  Chief Complaint  Patient presents with   New Patient (Initial Visit)    Room 48, with wife  ED referral for CVA,  States he is doing well, no new symptoms     HISTORY OF PRESENT ILLNESS:  This is a 88 year old gentleman with vascular risk factors including paroxysmal atrial fibrillation not on anticoagulation, hypertension, hyperlipidemia, diabetes mellitus type 2, coronary artery disease who was admitted to the hospital on July 23 for new onset dizziness.  At that time he was also reporting dysarthria.  His brain MRI was negative for any acute stroke and his CT angiogram head and neck showed calcification along the aortic arch and bilateral carotid bifurcation without significant stenosis in the neck.  His hemoglobin A1c was 7.9, and his LDL was 54. Patient reports symptoms improved, he was discharged on dual antiplatelet therapy aspirin Plavix for total of 21 days and plan was patient to continue with Plavix monotherapy.  He was placed on cardiac monitor but reported that since being home, cardiac monitor has not been working therefore he to return it to the cardiologist.  Since being home he has been back to his normal self, denies any episode of dizziness, denies any episode of dysarthria.  Currently has no complaints.  He is compliant with his medications.   OTHER MEDICAL CONDITIONS: Atrial fibrillation not on anticoagulation, CAD status post CABG x3, hypertension, hyperlipidemia, diabetes, peripheral vascular disease, diabetic neuropathy   REVIEW OF SYSTEMS: Full 14 system review of systems performed and negative with exception of: As noted in the HPI   ALLERGIES: No Known Allergies  HOME MEDICATIONS: Outpatient Medications Prior to Visit  Medication Sig  Dispense Refill   cholecalciferol (VITAMIN D3) 25 MCG (1000 UNIT) tablet Take 1,000 Units by mouth daily.     clopidogrel (PLAVIX) 75 MG tablet Take 1 tablet (75 mg total) by mouth daily. 30 tablet 5   gabapentin (NEURONTIN) 100 MG capsule Take 100 mg by mouth 3 (three) times daily.     lisinopril (ZESTRIL) 2.5 MG tablet Take 2.5 mg by mouth daily.     Magnesium 250 MG TABS Take by mouth daily.     metFORMIN (GLUCOPHAGE-XR) 500 MG 24 hr tablet Take 2 tablets (1,000 mg total) by mouth 2 (two) times daily with a meal. 60 tablet 3   metoprolol tartrate (LOPRESSOR) 25 MG tablet Take 0.5 tablets (12.5 mg total) by mouth 2 (two) times daily. 90 tablet 1   rosuvastatin (CRESTOR) 5 MG tablet Take 5 mg by mouth daily.     No facility-administered medications prior to visit.    PAST MEDICAL HISTORY: Past Medical History:  Diagnosis Date   Atrial fibrillation ([place])    a. 08/2014 in setting of NSTEMI - occurred immediately prior to CABG. Managed w/ short-term amio/OAC. No known recurrence.   Carotid artery stenosis    a. 10/2010 Carotid U/S: 1-49% bilat ICA stenoses.   Coronary artery disease    a. 2005 s/p PCI/DES to mLAD and dRCA; b. 09/17/2014 Cath: critical ostial/prox LAD estimated at 90-95%, critical prox LCx, RCA w/ mild diff dz, EF >55%; c. 08/2014 CABG x 3: LIMA->LAD, VG->OM, VG->RPDA.   Diastolic dysfunction    a. 03/2021 Echo: EF 55%, no rwma, GrII DD. Nl RV fxn. Mildly dil LA. Mild  MR; b. 11/2021 Echo: EF 55-60%, no rwma, GrI DD, nl RV size/fxn. Mildly dil LA. AoV sclerosis w/o stenosis.   GERD (gastroesophageal reflux disease)    Hyperlipidemia    Hypertension    PSVT (paroxysmal supraventricular tachycardia) ([place])    a. 07/2020 Zio: Predominantly sinus rhythm. 2 runs NSVT/Atrial Tachycardia.  17 runs SVT, fastest 10 beats @ 150. Longest 12 beats @ 121. Isolated PVCs (20.7% burden). Triggered events = PACs/PVCs.   PVC's (premature ventricular contractions)    a. 07/2020 Zio: [id number] PVCs over  14 days (20.7% burden)->managed w/ low-dose metoprolol.   PVD (peripheral vascular disease) ([place])     PAST SURGICAL HISTORY: Past Surgical History:  Procedure Laterality Date   CARDIAC CATHETERIZATION     CHOLECYSTECTOMY  2016   COLONOSCOPY WITH PROPOFOL N/A 12/17/2021   Procedure: COLONOSCOPY WITH PROPOFOL;  Surgeon: [name], MD;  Location: [facility] ENDOSCOPY;  Service: Gastroenterology;  Laterality: N/A;   CORONARY ARTERY BYPASS GRAFT N/A 09/20/2014   Procedure: CORONARY ARTERY BYPASS GRAFTING (CABG) x   three using left internal mammary artery and right leg greater saphenous vein harvested endoscopically;  Surgeon: Melrose Nakayama, MD;  Location: [place];  Service: Open Heart Surgery;  Laterality: N/A;   TEE WITHOUT CARDIOVERSION N/A 09/20/2014   Procedure: TRANSESOPHAGEAL ECHOCARDIOGRAM (TEE);  Surgeon: Melrose Nakayama, MD;  Location: [place];  Service: Open Heart Surgery;  Laterality: N/A;   TOTAL KNEE ARTHROPLASTY     bilateral    FAMILY HISTORY: Family History  Problem Relation Age of Onset   Heart failure Mother    Prostate cancer Neg Hx    Bladder Cancer Neg Hx    Kidney cancer Neg Hx     SOCIAL HISTORY: Social History   Socioeconomic History   Marital status: Married    Spouse name: Not on file   Number of children: Not on file   Years of education: Not on file   Highest education level: Not on file  Occupational History   Occupation: self employed  Tobacco Use   Smoking status: Former    Types: Cigarettes    Quit date: 09/14/1955    Years since quitting: 66.3   Smokeless tobacco: Never  Vaping Use   Vaping Use: Never used  Substance and Sexual Activity   Alcohol use: No   Drug use: No   Sexual activity: Not on file  Other Topics Concern   Not on file  Social History Narrative   Lives locally w/ wife.   Social Determinants of Health   Financial Resource Strain: Not on file  Food Insecurity: Not on file  Transportation Needs: Not on file   Physical Activity: Not on file  Stress: Not on file  Social Connections: Not on file  Intimate Partner Violence: Not on file    PHYSICAL EXAM  GENERAL EXAM/CONSTITUTIONAL: Vitals:  Vitals:   01/19/22 1418  BP: 123/70  Pulse: 64  Weight: 205 lb (93 kg)  Height: '5\' 11"'$  (1.803 m)   Body mass index is 28.59 kg/m. Wt Readings from Last 3 Encounters:  01/19/22 205 lb (93 kg)  12/23/21 200 lb (90.7 kg)  12/20/21 201 lb 15.1 oz (91.6 kg)   Patient is in no distress; well developed, nourished and groomed; neck is supple  EYES: Pupils round and reactive to light, Visual fields full to confrontation, Extraocular movements intacts,   MUSCULOSKELETAL: Gait, strength, tone, movements noted in Neurologic exam below  NEUROLOGIC: MENTAL STATUS:      No  data to display         awake, alert, oriented to person, place and time recent and remote memory intact normal attention and concentration language fluent, comprehension intact, naming intact fund of knowledge appropriate  CRANIAL NERVE:  2nd, 3rd, 4th, 6th - pupils equal and reactive to light, visual fields full to confrontation, extraocular muscles intact, no nystagmus 5th - facial sensation symmetric 7th - facial strength symmetric 8th - hearing intact 9th - palate elevates symmetrically, uvula midline 11th - shoulder shrug symmetric 12th - tongue protrusion midline  MOTOR:  normal bulk and tone, full strength in the BUE, BLE  SENSORY:  normal and symmetric to light touch  COORDINATION:  finger-nose-finger, fine finger movements normal  REFLEXES:  deep tendon reflexes present and symmetric  GAIT/STATION:  normal  DIAGNOSTIC DATA (LABS, IMAGING, TESTING) - I reviewed patient records, labs, notes, testing and imaging myself where available.  Lab Results  Component Value Date   WBC 9.7 12/23/2021   HGB 14.7 12/23/2021   HCT 43.5 12/23/2021   MCV 91.8 12/23/2021   PLT 103 (L) 12/23/2021      Component  Value Date/Time   NA 136 12/23/2021 0607   NA 133 (L) 09/17/2014 0442   K 4.2 12/23/2021 0607   K 4.0 09/17/2014 0442   CL 102 12/23/2021 0607   CL 106 09/17/2014 0442   CO2 26 12/23/2021 0607   CO2 27 09/17/2014 0442   GLUCOSE 181 (H) 12/23/2021 0607   GLUCOSE 160 (H) 09/17/2014 0442   BUN 20 12/23/2021 0607   BUN 18 09/17/2014 0442   CREATININE 1.17 12/23/2021 0607   CREATININE 0.86 09/17/2014 0442   CALCIUM 8.9 12/23/2021 0607   CALCIUM 8.2 (L) 09/17/2014 0442   PROT 6.8 12/23/2021 0607   PROT 6.2 (L) 07/08/2014 0435   ALBUMIN 4.0 12/23/2021 0607   ALBUMIN 2.8 (L) 07/08/2014 0435   AST 18 12/23/2021 0607   AST 71 (H) 07/08/2014 0435   ALT 19 12/23/2021 0607   ALT 164 (H) 07/08/2014 0435   ALKPHOS 48 12/23/2021 0607   ALKPHOS 268 (H) 07/08/2014 0435   BILITOT 0.6 12/23/2021 0607   BILITOT 5.7 (H) 07/08/2014 0435   GFRNONAA >60 12/23/2021 0607   GFRNONAA >60 09/17/2014 0442   GFRAA 90 (L) 09/25/2014 0510   GFRAA >60 09/17/2014 0442   Lab Results  Component Value Date   CHOL 130 12/21/2021   HDL 42 12/21/2021   LDLCALC 54 12/21/2021   TRIG 168 (H) 12/21/2021   CHOLHDL 3.1 12/21/2021   Lab Results  Component Value Date   HGBA1C 7.9 (H) 12/20/2021   No results found for: "VITAMINB12" Lab Results  Component Value Date   TSH 0.046 (L) 09/18/2014    MRI Brain 12/20/21 No evidence of acute intracranial abnormality. Minimal chronic small vessel ischemic changes within the cerebral white matter. Tiny chronic infarcts within the right cerebellar hemisphere. Mild generalized cerebral atrophy.   CTA Head and Neck 12/20/21 1. Moderate distal small vessel disease without a significant proximal stenosis, aneurysm, or branch vessel occlusion within the Circle of Willis. 2. Atherosclerotic calcifications at the aortic arch and bilateral carotid bifurcations without significant stenosis in the neck 3. Multilevel spondylosis of the cervical spine. 4. Stable substernal  goiter. 5. Aortic Atherosclerosis (ICD10-I70.0).   Echo  1. Left ventricular ejection fraction, by estimation, is 55 to 60%. The left ventricle has normal function. The left ventricle has no regional wall motion abnormalities. There is mild left ventricular hypertrophy.  Left ventricular diastolic parameters are consistent with Grade I diastolic dysfunction (impaired relaxation).  2. Right ventricular systolic function is normal. The right ventricular size is normal. Tricuspid regurgitation signal is inadequate for assessing PA pressure.  3. Left atrial size was mildly dilated.  4. The mitral valve is normal in structure. No evidence of mitral valve regurgitation. No evidence of mitral stenosis.  5. The aortic valve is normal in structure. Aortic valve regurgitation is not visualized. Aortic valve sclerosis is present, with no evidence of aortic valve stenosis.  6. The inferior vena cava is normal in size with greater than 50%  respiratory variability, suggesting right atrial pressure of 3 mmHg.  7. Agitated saline contrast bubble study was negative, with no evidence  of any interatrial shunt.     ASSESSMENT AND PLAN  88 y.o. year old male with with vascular risk factors including paroxysmal atrial fibrillation not on anticoagulation, hypertension, hyperlipidemia, diabetes mellitus type 2, coronary artery disease who was admitted to the hospital on July 23 for new onset dizziness and dysarthria with a negative MRI for stroke.  Patient likely had a TIA.  He has completed 21 days of DAPT, aspirin and Plavix and now continue on Plavix monotherapy.  He is home cardiac monitor did not work at home therefore he returned to his cardiologist.  He is pending additional follow-up with cardiologist, he will most most likely need additional monitoring.  At this time I will recommend patient to continue with Plavix monotherapy, continue with his other medication, follow-up with cardiologist and return in a  year.    1. TIA (transient ischemic attack)     Patient Instructions  Discontinue Aspirin and continue with Plavix  Continue your other medications  Follow up with cardiologist  Return in a year   No orders of the defined types were placed in this encounter.   No orders of the defined types were placed in this encounter.   Return in about 1 year (around 01/20/2023).  I have spent a total of 50 minutes dedicated to this patient today, preparing to see patient, performing a medically appropriate examination and evaluation, ordering tests and/or medications and procedures, and counseling and educating the patient/family/caregiver; independently interpreting result and communicating results to the family/patient/caregiver; and documenting clinical information in the electronic medical record.   Christopher Ran, MD 01/19/2022, 5:41 PM  [facility] [address] [place], [place] [address] [phone]

## 2022-01-21 ENCOUNTER — Ambulatory Visit: Payer: Medicare Other | Admitting: Gastroenterology

## 2022-02-03 ENCOUNTER — Ambulatory Visit (INDEPENDENT_AMBULATORY_CARE_PROVIDER_SITE_OTHER): Payer: Medicare Other

## 2022-02-03 ENCOUNTER — Ambulatory Visit: Payer: Medicare Other | Attending: Nurse Practitioner | Admitting: Nurse Practitioner

## 2022-02-03 ENCOUNTER — Encounter: Payer: Self-pay | Admitting: Nurse Practitioner

## 2022-02-03 VITALS — BP 138/66 | HR 69 | Ht 71.0 in | Wt 207.4 lb

## 2022-02-03 DIAGNOSIS — G459 Transient cerebral ischemic attack, unspecified: Secondary | ICD-10-CM | POA: Insufficient documentation

## 2022-02-03 DIAGNOSIS — I2510 Atherosclerotic heart disease of native coronary artery without angina pectoris: Secondary | ICD-10-CM | POA: Insufficient documentation

## 2022-02-03 DIAGNOSIS — I10 Essential (primary) hypertension: Secondary | ICD-10-CM | POA: Insufficient documentation

## 2022-02-03 DIAGNOSIS — E782 Mixed hyperlipidemia: Secondary | ICD-10-CM | POA: Insufficient documentation

## 2022-02-03 NOTE — Patient Instructions (Signed)
Medication Instructions:  - Your physician recommends that you continue on your current medications as directed. Please refer to the Current Medication list given to you today.  *If you need a refill on your cardiac medications before your next appointment, please call your pharmacy*   Lab Work: - none ordered  If you have labs (blood work) drawn today and your tests are completely normal, you will receive your results only by: [place] (if you have MyChart) OR A paper copy in the mail If you have any lab test that is abnormal or we need to change your treatment, we will call you to review the results.   Testing/Procedures: 1) Heart Monitor:  Length of Wear: 14 days  Your monitor will be mailed to your home address within 3-5 business days. However, if you have not received your monitor after 5 business days please send [place] a MyChart message or call the office at [phone], so we may follow up on this for you.   Your physician has recommended that you wear a Zio XT (heart) monitor.   This monitor is a medical device that records the heart's electrical activity. Doctors most often use these monitors to diagnose arrhythmias. Arrhythmias are problems with the speed or rhythm of the heartbeat. The monitor is a small device applied to your chest. You can wear one while you do your normal daily activities. While wearing this monitor if you have any symptoms to push the button and record what you felt. Once you have worn this monitor for the period of time provider prescribed (Usually 14 days), you will return the monitor device in the postage paid box. Once it is returned they will download the data collected and provide [place] with a report which the provider will then review and we will call you with those results. Important tips:  Avoid showering during the first 24 hours of wearing the monitor. Avoid excessive sweating to help maximize wear time. Do not submerge the device, no hot  tubs, and no swimming pools. Keep any lotions or oils away from the patch. After 24 hours you may shower with the patch on. Take brief showers with your back facing the shower head.  Do not remove patch once it has been placed because that will interrupt data and decrease adhesive wear time. Push the button when you have any symptoms and write down what you were feeling. Once you have completed wearing your monitor, remove and place into box which has postage paid and place in your outgoing mailbox.  If for some reason you have misplaced your box then call our office and we can provide another box and/or mail it off for you.       Follow-Up: At [facility], you and your health needs are our priority.  As part of our continuing mission to provide you with exceptional heart care, we have created designated Provider Care Teams.  These Care Teams include your primary Cardiologist (physician) and Advanced Practice Providers (APPs -  Physician Assistants and Nurse Practitioners) who all work together to provide you with the care you need, when you need it.  We recommend signing up for the patient portal called "MyChart".  Sign up information is provided on this After Visit Summary.  MyChart is used to connect with patients for Virtual Visits (Telemedicine).  Patients are able to view lab/test results, encounter notes, upcoming appointments, etc.  Non-urgent messages can be sent to your provider as well.   To  learn more about what you can do with MyChart, go to [email or web address].    Your next appointment:   2 month(s)  The format for your next appointment:   In Person  Provider:   You may see Ida Rogue, MD or one of the following Advanced Practice Providers on your designated Care Team:   Murray Hodgkins, NP    Other Instructions N/a  Important Information About [place]

## 2022-02-03 NOTE — Progress Notes (Signed)
Office Visit    Patient Name: Christopher [name] Date of Encounter: 02/03/2022  Primary Care Provider:  Sofie Hartigan, MD Primary Cardiologist:  Ida Rogue, MD  Chief Complaint    88 year old male with a history of carotid artery stenosis, CAD s/p PCI/DES to mLAD and dRCA in 2005, CABG x3 in 2016, DM2, HTN, HLD, GERD, PSVT, freq PVCs, periop Afib (2016), and PVD, Presenting today for follow up visit related to hospital admission for dizziness concerning for TIA on 12/20/21.   Past Medical History    Past Medical History:  Diagnosis Date   Atrial fibrillation ([place])    a. 08/2014 in setting of NSTEMI - occurred immediately prior to CABG. Managed w/ short-term amio/OAC. No known recurrence.   Carotid artery stenosis    a. 10/2010 Carotid U/S: 1-49% bilat ICA stenoses.   Coronary artery disease    a. 2005 s/p PCI/DES to mLAD and dRCA; b. 09/17/2014 Cath: critical ostial/prox LAD estimated at 90-95%, critical prox LCx, RCA w/ mild diff dz, EF >55%; c. 08/2014 CABG x 3: LIMA->LAD, VG->OM, VG->RPDA.   Diastolic dysfunction    a. 03/2021 Echo: EF 55%, no rwma, GrII DD. Nl RV fxn. Mildly dil LA. Mild MR; b. 11/2021 Echo: EF 55-60%, no rwma, GrI DD, nl RV size/fxn. Mildly dil LA. AoV sclerosis w/o stenosis.   GERD (gastroesophageal reflux disease)    Hyperlipidemia    Hypertension    PSVT (paroxysmal supraventricular tachycardia) ([place])    a. 07/2020 Zio: Predominantly sinus rhythm. 2 runs NSVT/Atrial Tachycardia.  17 runs SVT, fastest 10 beats @ 150. Longest 12 beats @ 121. Isolated PVCs (20.7% burden). Triggered events = PACs/PVCs.   PVC's (premature ventricular contractions)    a. 07/2020 Zio: [id number] PVCs over 14 days (20.7% burden)->managed w/ low-dose metoprolol.   PVD (peripheral vascular disease) [facility])    Past Surgical History:  Procedure Laterality Date   CARDIAC CATHETERIZATION     CHOLECYSTECTOMY  2016   COLONOSCOPY WITH PROPOFOL N/A 12/17/2021   Procedure: COLONOSCOPY WITH  PROPOFOL;  Surgeon: [name], MD;  Location: [facility] ENDOSCOPY;  Service: Gastroenterology;  Laterality: N/A;   CORONARY ARTERY BYPASS GRAFT N/A 09/20/2014   Procedure: CORONARY ARTERY BYPASS GRAFTING (CABG) x   three using left internal mammary artery and right leg greater saphenous vein harvested endoscopically;  Surgeon: Melrose Nakayama, MD;  Location: [place];  Service: Open Heart Surgery;  Laterality: N/A;   TEE WITHOUT CARDIOVERSION N/A 09/20/2014   Procedure: TRANSESOPHAGEAL ECHOCARDIOGRAM (TEE);  Surgeon: Melrose Nakayama, MD;  Location: [place];  Service: Open Heart Surgery;  Laterality: N/A;   TOTAL KNEE ARTHROPLASTY     bilateral    Allergies  No Known Allergies  History of Present Illness    88 y.o. male with a history of carotid artery stenosis, CAD s/p CABG x3 in 2016, DM2, HTN, HLD, GERD, PSVT, freq PVCs, periop Afib (2016), and PVD.  Cardiac hx dates back to 2005, when he underwent PCI/DES to the LAD and RCA.  In 08/2014, he suffered a NSTEMI and was found to have critical ostial LAD and proximal LCX dzs on cath.  He was planned for CABG and prior to CABG, developed Afib w/ RVR requiring amio.  He underwent successful CABG x 3 and was d/c'd in sinus rhythm.  He was maintained on amio and oral anticoagulation for a short-course following discharge, and both were subsequently d/c'd.  He has no known recurrence of afib, however, in the setting  of dizziness in March 2022, he wore a Zio monitor and it showed 17 brief runs of PSVT as well as a 20.7% PVC burden.  He was placed on metoprolol 12.'5mg'$  BID and an echo was ordered, but pt felt better and didn't carry through w/ echo until 03/2021, which showed an EF of 55% w/ grade II diast dysfxn.    He is followed by Dr. Rockey Situ and was most recently seen in the office in April.  In the setting of 7 year since ACS, plavix was d/c'd and he was cont on ASA.  [name] presented to [facility] on December 20, 2021 with headache and vertiginous  symptoms that had lasted for 30 minutes. CTA head/neck showed moderate distal small vessel dzs w/o significant stenosis, aneurysm, or branch vessel occlusion.  Atherosclerotic Ca2+ @ the Ao arch and bilat carotid bifurcations w/o significant stenosis was noted.  MRI of the brain showed minimal chronic small vessel ischemic changes w/in the cerebral white matter w/ tiny chronic infarcts within the R cerebellar hemisphere w/o acute intracranial abnormalities.  He was admitted to tele and seen by neuro w/ rec for ASA/Plavix and outpt event monitoring.  Echo showed nl LV fxn w/ GrI DD, and w/o significant valvular dzs. He was seen by neurology on 8/22 and continued on Plavix monotherapy.  He was mailed a 30 day event monitor following discharge.    Since being home, he states that his outpt event monitor did not work.  The monitor would have episodes of "no skin contact" that ultimately led to [name] sending it back after 15 days of intermittently wearing it.  Patient states that since being discharged, he has had no further episodes of dizziness or head throbbing.  He denies chest pain, palpitations, dyspnea, pnd, orthopnea, n, v, dizziness, syncope, edema, weight gain, or early satiety.   Home Medications    Current Outpatient Medications  Medication Sig Dispense Refill   cholecalciferol (VITAMIN D3) 25 MCG (1000 UNIT) tablet Take 1,000 Units by mouth daily.     clopidogrel (PLAVIX) 75 MG tablet Take 1 tablet (75 mg total) by mouth daily. 30 tablet 5   gabapentin (NEURONTIN) 100 MG capsule Take 100 mg by mouth 3 (three) times daily.     lisinopril (ZESTRIL) 2.5 MG tablet Take 2.5 mg by mouth daily.     Magnesium 250 MG TABS Take by mouth daily.     metFORMIN (GLUCOPHAGE-XR) 500 MG 24 hr tablet Take 2 tablets (1,000 mg total) by mouth 2 (two) times daily with a meal. 60 tablet 3   metoprolol tartrate (LOPRESSOR) 25 MG tablet Take 0.5 tablets (12.5 mg total) by mouth 2 (two) times daily. 90 tablet 1    rosuvastatin (CRESTOR) 5 MG tablet Take 5 mg by mouth daily.     No current facility-administered medications for this visit.     Review of Systems   General: no fever, chills, changes in weight, or night sweats. Cardiovascular: no chest pain, palpitations, dyspnea on exertion, orthopnea, paroxysmal nocturnal dyspnea. Neurological: no dizziness, lightheadedness, weakness, changes in mental status.  Respiratory: no shortness of breath, cough Abdominal: no nausea, vomiting, diarrhea, blood in stool  All other systems reviewed and are otherwise negative except as noted above.    Physical Exam    VS:  BP 138/66   Pulse 69   Ht '5\' 11"'$  (1.803 m)   Wt 207 lb 6.4 oz (94.1 kg)   SpO2 98%   BMI 28.93 kg/m  ,  BMI Body mass index is 28.93 kg/m.     Vitals:   02/03/22 0808 02/03/22 0831  BP: (!) 150/70 138/66  Pulse: 69   SpO2: 98%     GEN: Well nourished, well developed, in no acute distress. HEENT: normal. Neck: Supple, no JVD, carotid bruits, or masses. Cardiac: RRR, no murmurs, rubs, or gallops. No clubbing, cyanosis, Radials/PT 2+ and equal bilaterally.  Trace edema bilateral lower extremities.  Respiratory:  Respirations regular and unlabored, clear to auscultation bilaterally. GI: Soft, nontender, nondistended, BS + x 4. MS: no deformity or atrophy. Skin: warm and dry, no rash. Neuro:  Strength and sensation are intact. Psych: Normal affect.  Accessory Clinical Findings    ECG personally reviewed by me today  - normal sinus rhythm , 69, nonspecific T abnormalities- no acute changes.  Lab Results  Component Value Date   WBC 9.7 12/23/2021   HGB 14.7 12/23/2021   HCT 43.5 12/23/2021   MCV 91.8 12/23/2021   PLT 103 (L) 12/23/2021   Lab Results  Component Value Date   CREATININE 1.17 12/23/2021   BUN 20 12/23/2021   NA 136 12/23/2021   K 4.2 12/23/2021   CL 102 12/23/2021   CO2 26 12/23/2021   Lab Results  Component Value Date   ALT 19 12/23/2021   AST 18  12/23/2021   ALKPHOS 48 12/23/2021   BILITOT 0.6 12/23/2021   Lab Results  Component Value Date   CHOL 130 12/21/2021   HDL 42 12/21/2021   LDLCALC 54 12/21/2021   TRIG 168 (H) 12/21/2021   CHOLHDL 3.1 12/21/2021    Lab Results  Component Value Date   HGBA1C 7.9 (H) 12/20/2021    Assessment & Plan    1.  TIA/Vertiginous symptoms:  Pt presented 7/23 following a 30 min episode of vertiginous symptoms that started while eating breakfast.  Neuro w/u notable for small vessel dzs and tiny chronic R cerebellar infarcts, but no acute infarcts.  He does have a h/o periop AFib in the setting of nstemi/CABG in 08/2014, but has had no known recurrence.  On tele during hospital admission 11/2021 had runs of junctional tachycardia as well as periodic bradycardia and brief pauses up to 2.6 seconds, but no symptomatic arrhythmias.  Echo showed nl LVEF w/ GrI DD. Discharged with outpt monitor but was unable to complete 30 days d/t signal/skin contact loss, though it seemed to work better when he was closer to town, so there's a question if his cellular signal was the issue.  We will arrange for outpt 14 day event monitoring w/o live telemetry.  Continue Plavix as per neurology.   2. PAF:  In setting of NSTEMI/CABG in 08/2014.  No known recurrence.  No chest pain, palpitations or fluttering in chest.  Plan for new outpt event monitoring.  Continue beta blocker.  3. CAD:  s/p CABG x 3 in 2016.  No chest pain or dyspnea.  Echo w/ nl EF.  Continue beta blocker and statin.  Plavix as per neurology.   4. Essential HTN: Blood pressure mildly elevated on exam today.  Continue beta blocker and acei.  Continue blood pressure monitoring at home.   5. Hyperlipidemia: LDL 54, cont statin.   6. Disposition: follow-up in 2 months or sooner if necessary.    Murray Hodgkins, NP 02/03/2022, 5:30 PM

## 2022-02-05 DIAGNOSIS — G459 Transient cerebral ischemic attack, unspecified: Secondary | ICD-10-CM | POA: Diagnosis not present

## 2022-03-08 ENCOUNTER — Telehealth: Payer: Self-pay | Admitting: *Deleted

## 2022-03-08 NOTE — Telephone Encounter (Signed)
Spoke w/ pt.  Reviewed Chris's recommendation w/ him. He verbalizes understanding and is appreciative of the call.

## 2022-03-08 NOTE — Telephone Encounter (Signed)
Patient is returning call.  °

## 2022-03-08 NOTE — Telephone Encounter (Signed)
-----   Message from Theora Gianotti, NP sent at 03/08/2022  8:10 AM EDT ----- Predominantly normal heart rhythm at an average rate of 65 beats per minute.  3 brief runs of fast heart beats from the bottom chambers.  27 brief runs of fast heart beats from the top chambers.  Occasional isolated extra beats from both top and bottom chambers.  Triggered events were associated with normal rhythm.  No Afib/flutter to account for previous neurologic symptoms.  Continue current dose of metoprolol.

## 2022-03-08 NOTE — Telephone Encounter (Signed)
Left voicemail message to call back for review of results.  

## 2022-03-16 ENCOUNTER — Ambulatory Visit
Admission: EM | Admit: 2022-03-16 | Discharge: 2022-03-16 | Disposition: A | Discharge status: Home / Self Care | Payer: Medicare Other

## 2022-03-16 DIAGNOSIS — S29019A Strain of muscle and tendon of unspecified wall of thorax, initial encounter: Secondary | ICD-10-CM | POA: Diagnosis not present

## 2022-03-16 MED ORDER — BACLOFEN 10 MG PO TABS: 10.0000 mg | ORAL_TABLET | Freq: Three times a day (TID) | ORAL | 0 refills | Status: DC

## 2022-03-16 MED ORDER — DEXAMETHASONE SODIUM PHOSPHATE 10 MG/ML IJ SOLN
10.0000 mg | Freq: Once | INTRAMUSCULAR | Status: AC
  Administered 2022-03-16: 10 mg via INTRAMUSCULAR

## 2022-03-16 MED ORDER — PREDNISONE 10 MG (21) PO TBPK: ORAL_TABLET | ORAL | 0 refills | Status: DC

## 2022-03-16 NOTE — ED Triage Notes (Signed)
Patient reports that he lifted something very heavy yesterday and now his back is hurting.   Patient reports left side back pain.

## 2022-03-16 NOTE — Discharge Instructions (Addendum)
Take the prednisone daily at breakfast starting tomorrow morning.  Take the baclofen, 10 mg every 8 hours, on a schedule for the next 48 hours and then as needed.  Apply moist heat to your back for 30 minutes at a time 2-3 times a day to improve blood flow to the area and help remove the lactic acid causing the spasm.  Follow the back exercises given at discharge.  Return for reevaluation for any new or worsening symptoms.

## 2022-03-16 NOTE — ED Provider Notes (Signed)
[facility]    CSN: [id number] Arrival date & time: 03/16/22  0802      History   Chief Complaint Chief Complaint  Patient presents with   Back Pain    HPI Christopher [name] is a 88 y.o. male.   HPI  88 year old male here for evaluation of back pain.  The patient reports that he was lifting a 5 gallon cans of diesel fuel to fill his tractor yesterday and began having left-sided mid back pain soon after.  The pain increases with movement and deep breathing.  It does not radiate anywhere.  Patient denies any painful urination or urinary urgency or frequency.  He has used topical emollients without much improvement of symptoms as well as ice.  Past Medical History:  Diagnosis Date   Atrial fibrillation ([place])    a. 08/2014 in setting of NSTEMI - occurred immediately prior to CABG. Managed w/ short-term amio/OAC. No known recurrence.   Carotid artery stenosis    a. 10/2010 Carotid U/S: 1-49% bilat ICA stenoses.   Coronary artery disease    a. 2005 s/p PCI/DES to mLAD and dRCA; b. 09/17/2014 Cath: critical ostial/prox LAD estimated at 90-95%, critical prox LCx, RCA w/ mild diff dz, EF >55%; c. 08/2014 CABG x 3: LIMA->LAD, VG->OM, VG->RPDA.   Diastolic dysfunction    a. 03/2021 Echo: EF 55%, no rwma, GrII DD. Nl RV fxn. Mildly dil LA. Mild MR; b. 11/2021 Echo: EF 55-60%, no rwma, GrI DD, nl RV size/fxn. Mildly dil LA. AoV sclerosis w/o stenosis.   GERD (gastroesophageal reflux disease)    Hyperlipidemia    Hypertension    PSVT (paroxysmal supraventricular tachycardia)    a. 07/2020 Zio: Predominantly sinus rhythm. 2 runs NSVT/Atrial Tachycardia.  17 runs SVT, fastest 10 beats @ 150. Longest 12 beats @ 121. Isolated PVCs (20.7% burden). Triggered events = PACs/PVCs.   PVC's (premature ventricular contractions)    a. 07/2020 Zio: [id number] PVCs over 14 days (20.7% burden)->managed w/ low-dose metoprolol.   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   PVD (peripheral vascular disease) ([place]) 05/19/2011   Coronary atherosclerosis 12/18/2009   Mixed hyperlipidemia 06/19/2009    Past Surgical History:  Procedure Laterality Date   CARDIAC CATHETERIZATION     CHOLECYSTECTOMY  2016   COLONOSCOPY WITH PROPOFOL N/A 12/17/2021   Procedure: COLONOSCOPY WITH PROPOFOL;  Surgeon: [name], MD;  Location: [facility] ENDOSCOPY;  Service: Gastroenterology;  Laterality: N/A;   CORONARY ARTERY BYPASS GRAFT N/A 09/20/2014   Procedure: CORONARY ARTERY BYPASS GRAFTING (CABG) x   three using left internal mammary artery and right leg greater saphenous vein harvested endoscopically;  Surgeon: Melrose Nakayama, MD;  Location: [place];  Service: Open Heart Surgery;  Laterality: N/A;   TEE WITHOUT CARDIOVERSION N/A 09/20/2014   Procedure: TRANSESOPHAGEAL ECHOCARDIOGRAM (TEE);  Surgeon: Melrose Nakayama, MD;  Location: [place];  Service: Open Heart Surgery;  Laterality: N/A;   TOTAL KNEE ARTHROPLASTY     bilateral  Home Medications    Prior to Admission medications   Medication Sig Start Date End Date Taking? Authorizing Provider  baclofen (LIORESAL) 10 MG tablet Take 1 tablet (10 mg total) by mouth 3 (three) times daily. 03/16/22  Yes Margarette Canada, NP  cholecalciferol (VITAMIN D3) 25 MCG (1000 UNIT) tablet Take 1,000 Units by mouth daily.   Yes [provider]  clopidogrel (PLAVIX) 75 MG tablet Take 1 tablet (75 mg total) by mouth daily.  12/22/21  Yes Dwyane Dee, MD  gabapentin (NEURONTIN) 100 MG capsule Take 100 mg by mouth 3 (three) times daily.   Yes [provider]  lisinopril (ZESTRIL) 2.5 MG tablet Take 2.5 mg by mouth daily. 12/25/20  Yes [provider]  Magnesium 250 MG TABS Take by mouth daily.   Yes [provider]  metFORMIN (GLUCOPHAGE-XR) 500 MG 24 hr tablet Take 2 tablets (1,000 mg total) by mouth 2 (two) times daily with a meal. 12/21/21  Yes Dwyane Dee, MD  metoprolol tartrate (LOPRESSOR) 25 MG tablet Take 0.5 tablets (12.5 mg total) by mouth 2 (two) times daily. 04/27/21  Yes Gollan, Kathlene November, MD  omeprazole (PRILOSEC) 20 MG capsule Take 20 mg by mouth daily. 01/17/22  Yes [provider]  predniSONE (STERAPRED UNI-PAK 21 TAB) 10 MG (21) TBPK tablet Take 6 tablets on day 1, 5 tablets day 2, 4 tablets day 3, 3 tablets day 4, 2 tablets day 5, 1 tablet day 6 03/16/22  Yes Margarette Canada, NP  rosuvastatin (CRESTOR) 5 MG tablet Take 5 mg by mouth daily. 06/06/20  Yes [provider]    Family History Family History  Problem Relation Age of Onset   Heart failure Mother    Prostate cancer Neg Hx    Bladder Cancer Neg Hx    Kidney cancer Neg Hx     Social History Social History   Tobacco Use   Smoking status: Former    Types: Cigarettes    Quit date: 09/14/1955    Years since quitting: 66.5   Smokeless tobacco: Never  Vaping Use   Vaping Use: Never used  Substance Use Topics   Alcohol use: No   Drug use: No     Allergies   Patient has no known allergies.   Review of Systems Review of Systems  Genitourinary:  Negative for dysuria, flank pain, frequency and urgency.  Musculoskeletal:  Positive for back pain.       Left-sided nonradiating mid back pain.     Physical Exam Triage Vital Signs ED Triage Vitals  Enc Vitals Group     BP 03/16/22 0809 (!) 194/90     Pulse Rate 03/16/22 0809 99     Resp --      Temp 03/16/22 0809 97.8 F (36.6 C)      Temp Source 03/16/22 0809 Oral     SpO2 03/16/22 0809 97 %     Weight 03/16/22 0807 207 lb 7.3 oz (94.1 kg)     Height 03/16/22 0807 '5\' 11"'$  (1.803 m)     Head Circumference --      Peak Flow --      Pain Score 03/16/22 0807 10     Pain Loc --      Pain Edu? --      Excl. in [place]? --    No data found.  Updated Vital Signs BP (!) 194/90 (BP Location: Left Arm)   Pulse 99   Temp 97.8 F (36.6 C) (Oral)  Ht '5\' 11"'$  (1.803 m)   Wt 207 lb 7.3 oz (94.1 kg)   SpO2 97%   BMI 28.93 kg/m   Visual Acuity Right Eye Distance:   Left Eye Distance:   Bilateral Distance:    Right Eye Near:   Left Eye Near:    Bilateral Near:     Physical Exam Vitals and nursing note reviewed.  Constitutional:      Appearance: Normal appearance. He is not ill-appearing.  HENT:     Head: Normocephalic and atraumatic.  Cardiovascular:     Rate and Rhythm: Normal rate and regular rhythm.     Pulses: Normal pulses.     Heart sounds: Normal heart sounds. No murmur heard.    No friction rub. No gallop.  Pulmonary:     Effort: Pulmonary effort is normal.     Breath sounds: Normal breath sounds. No wheezing, rhonchi or rales.  Musculoskeletal:        General: Tenderness present. No swelling, deformity or signs of injury.     Comments: There is mild spasm and tenderness to the lower thoracic left paraspinous muscle group.  Skin:    General: Skin is warm and dry.     Capillary Refill: Capillary refill takes less than 2 seconds.  Neurological:     General: No focal deficit present.     Mental Status: He is alert and oriented to person, place, and time.  Psychiatric:        Mood and Affect: Mood normal.        Behavior: Behavior normal.        Thought Content: Thought content normal.        Judgment: Judgment normal.      UC Treatments / Results  Labs (all labs ordered are listed, but only abnormal results are displayed) Labs Reviewed - No data to display  EKG   Radiology No results  found.  Procedures Procedures (including critical care time)  Medications Ordered in UC Medications  dexamethasone (DECADRON) injection 10 mg (10 mg Intramuscular Given 03/16/22 0826)    Initial Impression / Assessment and Plan / UC Course  I have reviewed the triage vital signs and the nursing notes.  Pertinent labs & imaging results that were available during my care of the patient were reviewed by me and considered in my medical decision making (see chart for details).   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  He states the pain has been constant and increases with movement and deep breathing.  He has never injured his back in the past and reports that he is done this exact activity 100s of times without issue.  He has tried topical [place] and ice without improvement of symptoms.  His exam does reveal spasm and tenderness to the left lower thoracic paraspinous region.  He has no midline spinous process tenderness.  He denies any urinary symptoms.  Patient also has a history of prediabetes and is on Glucophage.  His cardiac and hypertensive history I am reluctant to use NSAIDs.  I will treat the patient with a prednisone pack and baclofen for inflammation and muscle spasm.  I will give him a shot of Decadron 10 mg in clinic and have him  start the prednisone in the morning.  I have also given him home physical therapy exercises to do and I have suggested he use moist heat instead of ice to improve blood flow and aid in healing.  Return precautions reviewed.   Final Clinical Impressions(s) / UC Diagnoses   Final diagnoses:  Thoracic myofascial strain, initial  encounter     Discharge Instructions      Take the prednisone daily at breakfast starting tomorrow morning.  Take the baclofen, 10 mg every 8 hours, on a schedule for the next 48 hours and then as needed.  Apply moist heat to your back for 30 minutes at a time 2-3 times a day to improve blood flow to the area and help remove the lactic acid causing the spasm.  Follow the back exercises given at discharge.  Return for reevaluation for any new or worsening symptoms.      ED Prescriptions     Medication Sig Dispense Auth. Provider   predniSONE (STERAPRED UNI-PAK 21 TAB) 10 MG (21) TBPK tablet Take 6 tablets on day 1, 5 tablets day 2, 4 tablets day 3, 3 tablets day 4, 2 tablets day 5, 1 tablet day 6 21 tablet Margarette Canada, NP   baclofen (LIORESAL) 10 MG tablet Take 1 tablet (10 mg total) by mouth 3 (three) times daily. 29 each Margarette Canada, NP      PDMP not reviewed this encounter.   Margarette Canada, NP 03/16/22 [phone]
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
## 2022-04-05 ENCOUNTER — Encounter: Payer: Self-pay | Admitting: Cardiovascular Disease

## 2022-04-05 ENCOUNTER — Ambulatory Visit: Payer: Medicare Other | Attending: Cardiovascular Disease | Admitting: Cardiovascular Disease

## 2022-04-05 VITALS — BP 122/70 | HR 76 | Wt 208.4 lb

## 2022-04-05 DIAGNOSIS — I739 Peripheral vascular disease, unspecified: Secondary | ICD-10-CM | POA: Diagnosis not present

## 2022-04-05 DIAGNOSIS — I2510 Atherosclerotic heart disease of native coronary artery without angina pectoris: Secondary | ICD-10-CM | POA: Diagnosis not present

## 2022-04-05 DIAGNOSIS — I493 Ventricular premature depolarization: Secondary | ICD-10-CM | POA: Diagnosis present

## 2022-04-05 DIAGNOSIS — I4891 Unspecified atrial fibrillation: Secondary | ICD-10-CM

## 2022-04-05 DIAGNOSIS — Z951 Presence of aortocoronary bypass graft: Secondary | ICD-10-CM | POA: Diagnosis not present

## 2022-04-05 DIAGNOSIS — G459 Transient cerebral ischemic attack, unspecified: Secondary | ICD-10-CM

## 2022-04-05 DIAGNOSIS — I10 Essential (primary) hypertension: Secondary | ICD-10-CM

## 2022-04-05 DIAGNOSIS — E782 Mixed hyperlipidemia: Secondary | ICD-10-CM

## 2022-04-05 NOTE — Patient Instructions (Signed)
Medication Instructions:  No changes  If you need a refill on your cardiac medications before your next appointment, please call your pharmacy.   Lab work: No new labs needed  Testing/Procedures: No new testing needed  Follow-Up: At [facility], you and your health needs are our priority.  As part of our continuing mission to provide you with exceptional heart care, we have created designated Provider Care Teams.  These Care Teams include your primary Cardiologist (physician) and Advanced Practice Providers (APPs -  Physician Assistants and Nurse Practitioners) who all work together to provide you with the care you need, when you need it.  You will need a follow up appointment in 12 months  Providers on your designated Care Team:   Christopher Berge, NP Ryan Dunn, PA-C Cadence Furth, PA-C  COVID-19 Vaccine Information can be found at: [email or web address] For questions related to vaccine distribution or appointments, please email [email or web address] or call [phone].   

## 2022-06-21 ENCOUNTER — Other Ambulatory Visit: Payer: Self-pay

## 2022-06-21 MED ORDER — CLOPIDOGREL BISULFATE 75 MG PO TABS: 75.0000 mg | ORAL_TABLET | Freq: Every day | ORAL | 11 refills | Status: DC

## 2022-08-10 ENCOUNTER — Emergency Department
Admission: EM | Admit: 2022-08-10 | Discharge: 2022-08-10 | Disposition: A | Discharge status: Home / Self Care | Payer: Medicare Other | Attending: Emergency Medicine | Admitting: Emergency Medicine

## 2022-08-10 DIAGNOSIS — I2510 Atherosclerotic heart disease of native coronary artery without angina pectoris: Secondary | ICD-10-CM | POA: Insufficient documentation

## 2022-08-10 DIAGNOSIS — R55 Syncope and collapse: Secondary | ICD-10-CM | POA: Insufficient documentation

## 2022-08-10 DIAGNOSIS — I10 Essential (primary) hypertension: Secondary | ICD-10-CM | POA: Insufficient documentation

## 2022-08-10 DIAGNOSIS — E119 Type 2 diabetes mellitus without complications: Secondary | ICD-10-CM | POA: Diagnosis not present

## 2022-08-10 LAB — BASIC METABOLIC PANEL
Anion gap: 9 (ref 5–15)
BUN: 15 mg/dL (ref 8–23)
CO2: 26 mmol/L (ref 22–32)
Calcium: 9.1 mg/dL (ref 8.9–10.3)
Chloride: 98 mmol/L (ref 98–111)
Creatinine, Ser: 1.14 mg/dL (ref 0.61–1.24)
GFR, Estimated: >60 mL/min (ref >60)
Glucose, Bld: 314 mg/dL — ABNORMAL HIGH (ref 70–99)
Potassium: 4.2 mmol/L (ref 3.5–5.1)
Sodium: 133 mmol/L — ABNORMAL LOW (ref 135–145)

## 2022-08-10 LAB — CBC
HCT: 44.7 % (ref 39.0–52.0)
Hemoglobin: 15.6 g/dL (ref 13.0–17.0)
MCH: 31.4 pg (ref 26.0–34.0)
MCHC: 34.9 g/dL (ref 30.0–36.0)
MCV: 89.9 fL (ref 80.0–100.0)
Platelets: 127 10*3/uL — ABNORMAL LOW (ref 150–400)
RBC: 4.97 MIL/uL (ref 4.22–5.81)
RDW: 13.9 % (ref 11.5–15.5)
WBC: 6.8 10*3/uL (ref 4.0–10.5)
nRBC: 0 % (ref 0.0–0.2)

## 2022-08-10 LAB — URINALYSIS, ROUTINE W REFLEX MICROSCOPIC
Bacteria, UA: NONE SEEN
Bilirubin Urine: NEGATIVE
Glucose, UA: >=500 mg/dL — AB
Ketones, ur: NEGATIVE mg/dL
Leukocytes,Ua: NEGATIVE
Nitrite: NEGATIVE
Protein, ur: NEGATIVE mg/dL
Specific Gravity, Urine: 1.02 (ref 1.005–1.030)
pH: 6 (ref 5.0–8.0)

## 2022-08-10 NOTE — ED Triage Notes (Signed)
Pt presents to the ED due to near-syncope episode. Pt states he was feeling fine, got up to walk to the deck and "almost passed out". Pt denies LOC and falling. Pt A&Ox4. Pt NAD

## 2022-08-10 NOTE — ED Provider Notes (Signed)
[facility], [place] Provider Note    Event Date/Time   First MD Initiated Contact with Patient 08/10/22 1211     (approximate)   History   Chief Complaint: Near Syncope   HPI  Christopher [name] is a 89 y.o. male with a history of hypertension, paroxysmal SVT, CAD, diabetes who comes the ED due to an episode of near syncope.  Patient states he was in his usual state of health at home, at pancakes with syrup, and then when he stood up to walk out to his deck and got lightheaded.  Denies loss of consciousness or fall.  No trauma.  Quickly recovered back to normal.  Denies any preceding or current pain or other acute complaints.  No recent illness. No headache, vision change, paresthesia, or motor weakness.  Has had episodes like this in the past with extensive unrevealing workup.     Physical Exam   Triage Vital Signs: ED Triage Vitals  Enc Vitals Group     BP 08/10/22 1128 (!) 171/76     Pulse Rate 08/10/22 1128 65     Resp 08/10/22 1128 18     Temp 08/10/22 1128 (!) 97.5 F (36.4 C)     Temp Source 08/10/22 1128 Oral     SpO2 08/10/22 1128 96 %     Weight 08/10/22 1135 208 lb 5.4 oz (94.5 kg)     Height 08/10/22 1135 '5\' 11"'$  (1.803 m)     Head Circumference --      Peak Flow --      Pain Score 08/10/22 1135 0     Pain Loc --      Pain Edu? --      Excl. in [place]? --     Most recent vital signs: Vitals:   08/10/22 1128 08/10/22 1239  BP: (!) 171/76 (!) 144/73  Pulse: 65 71  Resp: 18 13  Temp: (!) 97.5 F (36.4 C)   SpO2: 96% 99%    General: Awake, no distress.  CV:  Good peripheral perfusion.  Regular rate rhythm Resp:  Normal effort.  Clear to auscultation bilaterally Abd:  No distention.  Soft nontender Other:  Moist oral mucosa   ED Results / Procedures / Treatments   Labs (all labs ordered are listed, but only abnormal results are displayed) Labs Reviewed  BASIC METABOLIC PANEL - Abnormal; Notable for the following components:      Result  Value   Sodium 133 (*)    Glucose, Bld 314 (*)    All other components within normal limits  CBC - Abnormal; Notable for the following components:   Platelets 127 (*)    All other components within normal limits  URINALYSIS, ROUTINE W REFLEX MICROSCOPIC - Abnormal; Notable for the following components:   Color, Urine YELLOW (*)    APPearance CLEAR (*)    Glucose, UA >=500 (*)    Hgb urine dipstick SMALL (*)    All other components within normal limits  CBG MONITORING, ED     EKG Interpreted by me Sinus rhythm, rate of 64.  Normal axis, normal intervals.  Normal QRS ST segments and T waves.   RADIOLOGY    PROCEDURES:  Procedures   MEDICATIONS ORDERED IN ED: Medications - No data to display   IMPRESSION / MDM / [place] / ED COURSE  I reviewed the triage vital signs and the nursing notes.  DDx: Autonomic insufficiency, dehydration, AKI, electrolyte abnormality, anemia, UTI  Patient's  presentation is most consistent with acute presentation with potential threat to life or bodily function.  Patient presents with a brief episode of lightheadedness after first standing up.  No acute complaints.  Back to normal now.  Labs are all normal.  Doubt ACS PE dissection stroke intracranial hemorrhage or infection.  Stable for discharge home and outpatient follow-up.       FINAL CLINICAL IMPRESSION(S) / ED DIAGNOSES   Final diagnoses:  Near syncope     Rx / DC Orders   ED Discharge Orders     None        Note:  This document was prepared using Dragon voice recognition software and may include unintentional dictation errors.   Carrie Mew, MD 08/10/22 1339

## 2022-08-10 NOTE — ED Notes (Addendum)
First Nurse Note: Pt to ED via [facility] from home for near syncopal episode. Pt reports that he was out in the yard and started feeling dizzy. Family reported that when patient came inside he was diaphoretic. Pt has hx/o CVA and HTN.   HR 60 BP 180/90 CBG 326 SpO2 98 Pt has 20 G IV in the Left [facility]

## 2022-08-10 NOTE — ED Notes (Addendum)
Pt informed RN that last time he had an episode like this, he had really sharp pain in his head and his cardiologist told him they believed he had a stroke in the back of his neck. He states this time it fell more like a numbness in his head rather than pain. Provider informed MD, Joni Fears. No new orders at this time

## 2023-01-20 ENCOUNTER — Ambulatory Visit: Payer: Medicare Other | Admitting: Neurology

## 2023-03-09 ENCOUNTER — Ambulatory Visit
Admission: EM | Admit: 2023-03-09 | Discharge: 2023-03-09 | Disposition: A | Discharge status: Home / Self Care | Payer: Medicare Other

## 2023-03-09 ENCOUNTER — Encounter: Payer: Self-pay | Admitting: Emergency Medicine

## 2023-03-09 ENCOUNTER — Ambulatory Visit (INDEPENDENT_AMBULATORY_CARE_PROVIDER_SITE_OTHER): Payer: Medicare Other

## 2023-03-09 DIAGNOSIS — R0782 Intercostal pain: Secondary | ICD-10-CM

## 2023-03-09 DIAGNOSIS — R0781 Pleurodynia: Secondary | ICD-10-CM

## 2023-03-09 DIAGNOSIS — M546 Pain in thoracic spine: Secondary | ICD-10-CM

## 2023-03-09 DIAGNOSIS — M7918 Myalgia, other site: Secondary | ICD-10-CM

## 2023-03-09 MED ORDER — BACLOFEN 5 MG PO TABS: 5.0000 mg | ORAL_TABLET | Freq: Two times a day (BID) | ORAL | 0 refills | Status: AC | PRN

## 2023-03-09 MED ORDER — LIDOCAINE 5 % EX PTCH: 1.0000 | MEDICATED_PATCH | CUTANEOUS | 0 refills | Status: AC

## 2023-03-09 NOTE — ED Triage Notes (Signed)
Pt presents with a sharp pain in the middle of his lower back x 2 weeks. Pt states he has had the pain for awhile but it has been more consistent. He has not tried anything to relieve the pain.

## 2023-03-09 NOTE — ED Provider Notes (Signed)
[facility]    CSN: [id number] Arrival date & time: 03/09/23  1300      History   Chief Complaint Chief Complaint  Patient presents with   Back Pain    HPI EMIR NACK is a 89 y.o. male.   Patient presents today with a prolonged history of right thoracic back pain that has worsened significantly in the past 3 weeks.  He was seen by our clinic 03/16/2022 for similar symptoms and given baclofen and prednisone.  He reports symptoms improved and would occur intermittently but have become persistent and more bothersome recently.  He denies any recent trauma or fall.  Denies previous injury or surgery involving his back.  He reports there is a specific lump in the back over his rib that is the site of pain.  He has tried massage as well as topical medications without improvement of symptoms.  Denies any associated fever, rash, chest pain, shortness of breath, cough, nausea, vomiting.  During episodes pain is rated 10 on a 0-10 pain scale, lasting for several minutes, no radiation or spread of pain, no alleviating factors identified.    Past Medical History:  Diagnosis Date   Atrial fibrillation (HCC)    a. 08/2014 in setting of NSTEMI - occurred immediately prior to CABG. Managed w/ short-term amio/OAC. No known recurrence.   Carotid artery stenosis    a. 10/2010 Carotid U/S: 1-49% bilat ICA stenoses.   Coronary artery disease    a. 2005 s/p PCI/DES to mLAD and dRCA; b. 09/17/2014 Cath: critical ostial/prox LAD estimated at 90-95%, critical prox LCx, RCA w/ mild diff dz, EF >55%; c. 08/2014 CABG x 3: LIMA->LAD, VG->OM, VG->RPDA.   Diastolic dysfunction    a. 03/2021 Echo: EF 55%, no rwma, GrII DD. Nl RV fxn. Mildly dil LA. Mild MR; b. 11/2021 Echo: EF 55-60%, no rwma, GrI DD, nl RV size/fxn. Mildly dil LA. AoV sclerosis w/o stenosis.   GERD (gastroesophageal reflux disease)    Hyperlipidemia    Hypertension    PSVT (paroxysmal supraventricular tachycardia) (HCC)    a. 07/2020  Zio: Predominantly sinus rhythm. 2 runs NSVT/Atrial Tachycardia.  17 runs SVT, fastest 10 beats @ 150. Longest 12 beats @ 121. Isolated PVCs (20.7% burden). Triggered events = PACs/PVCs.   PVC's (premature ventricular contractions)    a. 07/2020 Zio: [id number] PVCs over 14 days (20.7% burden)->managed w/ low-dose metoprolol.   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   GERD (gastroesophageal reflux disease) 01/30/2020   Thyroid nodule 01/30/2020   S/P CABG x 3 09/20/2014   Atrial fibrillation with RVR (HCC) 09/19/2014   NSTEMI (non-ST elevated myocardial infarction) (HCC) 09/19/2014   Borderline diabetes 06/12/2014   HTN (hypertension), benign 02/28/2014   Hepatotoxicity due to statin drug 11/02/2012   PVD (peripheral vascular disease) (HCC) 05/19/2011   Coronary atherosclerosis 12/18/2009   Mixed hyperlipidemia 06/19/2009    Past Surgical History:  Procedure Laterality Date   CARDIAC CATHETERIZATION     CHOLECYSTECTOMY  2016   COLONOSCOPY WITH PROPOFOL N/A 12/17/2021   Procedure: COLONOSCOPY WITH PROPOFOL;  Surgeon: Wyline Mood, MD;  Location: [facility] ENDOSCOPY;  Service: Gastroenterology;  Laterality: N/A;   CORONARY ARTERY BYPASS GRAFT N/A 09/20/2014   Procedure: CORONARY ARTERY BYPASS GRAFTING (CABG) x   three using left internal mammary artery and  right leg greater saphenous vein harvested endoscopically;  Surgeon: Loreli Slot, MD;  Location: [facility] OR;  Service: Open Heart Surgery;  Laterality: N/A;   TEE WITHOUT CARDIOVERSION N/A 09/20/2014   Procedure: TRANSESOPHAGEAL ECHOCARDIOGRAM (TEE);  Surgeon: Loreli Slot, MD;  Location: [facility] OR;  Service: Open Heart Surgery;  Laterality: N/A;   TOTAL KNEE ARTHROPLASTY     bilateral       Home Medications    Prior to Admission  medications   Medication Sig Start Date End Date Taking? Authorizing Provider  Baclofen 5 MG TABS Take 1 tablet (5 mg total) by mouth 2 (two) times daily as needed. 03/09/23  Yes Island Dohmen, Denny Peon K, PA-C  dapagliflozin propanediol (FARXIGA) 5 MG TABS tablet Take by mouth. 12/22/22 12/22/23 Yes [provider]  lidocaine (LIDODERM) 5 % Place 1 patch onto the skin daily. Remove & Discard patch within 12 hours or as directed by MD 03/09/23  Yes Kerwin Augustus, Noberto Retort, PA-C  cholecalciferol (VITAMIN D3) 25 MCG (1000 UNIT) tablet Take 1,000 Units by mouth daily.    [provider]  clopidogrel (PLAVIX) 75 MG tablet Take 1 tablet (75 mg total) by mouth daily. 06/21/22   Antonieta Iba, MD  gabapentin (NEURONTIN) 100 MG capsule Take 100 mg by mouth 3 (three) times daily.    [provider]  lisinopril (ZESTRIL) 2.5 MG tablet Take 2.5 mg by mouth daily. 12/25/20   [provider]  Magnesium 250 MG TABS Take by mouth daily.    [provider]  metFORMIN (GLUCOPHAGE-XR) 500 MG 24 hr tablet Take 2 tablets (1,000 mg total) by mouth 2 (two) times daily with a meal. 12/21/21   Lewie Chamber, MD  metoprolol tartrate (LOPRESSOR) 25 MG tablet Take 0.5 tablets (12.5 mg total) by mouth 2 (two) times daily. 04/27/21   Antonieta Iba, MD  omeprazole (PRILOSEC) 20 MG capsule Take 20 mg by mouth daily. 01/17/22   [provider]  predniSONE (STERAPRED UNI-PAK 21 TAB) 10 MG (21) TBPK tablet Take 6 tablets on day 1, 5 tablets day 2, 4 tablets day 3, 3 tablets day 4, 2 tablets day 5, 1 tablet day 6 Patient not taking: Reported on 04/05/2022 03/16/22   Becky Augusta, NP  rosuvastatin (CRESTOR) 5 MG tablet Take 5 mg by mouth daily. 06/06/20   [provider]    Family History Family History  Problem Relation Age of Onset   Heart failure Mother    Prostate cancer Neg Hx    Bladder Cancer Neg Hx    Kidney cancer Neg Hx     Social History Social History   Tobacco Use    Smoking status: Former    Current packs/day: 0.00    Types: Cigarettes    Quit date: 09/14/1955    Years since quitting: 67.5   Smokeless tobacco: Never  Vaping Use   Vaping status: Never Used  Substance Use Topics   Alcohol use: No   Drug use: No     Allergies   Patient has no known allergies.   Review of Systems Review of Systems  Constitutional:  Positive for activity change. Negative for appetite change, fatigue and fever.  Respiratory:  Negative for cough and shortness of breath.   Cardiovascular:  Negative for chest pain.  Gastrointestinal:  Negative for abdominal pain, diarrhea, nausea and vomiting.  Genitourinary:  Negative for dysuria, frequency and urgency.  Musculoskeletal:  Positive for back pain. Negative for arthralgias and myalgias.  Skin:  Negative for rash.  Neurological:  Negative for weakness and numbness.     Physical Exam Triage Vital Signs ED Triage Vitals  Encounter Vitals Group     BP 03/09/23 1351 131/69     Systolic BP Percentile --      Diastolic BP Percentile --      Pulse Rate 03/09/23 1350 69     Resp 03/09/23 1350 16     Temp 03/09/23 1350 98.1 F (36.7 C)     Temp Source 03/09/23 1350 Oral     SpO2 03/09/23 1350 98 %     Weight --      Height --      Head Circumference --      Peak Flow --      Pain Score 03/09/23 1349 10     Pain Loc --      Pain Education --      Exclude from Growth Chart --    No data found.  Updated Vital Signs BP 131/69   Pulse 69   Temp 98.1 F (36.7 C) (Oral)   Resp 16   SpO2 98%   Visual Acuity Right Eye Distance:   Left Eye Distance:   Bilateral Distance:    Right Eye Near:   Left Eye Near:    Bilateral Near:     Physical Exam Vitals reviewed.  Constitutional:      General: He is awake.     Appearance: Normal appearance. He is well-developed. He is not ill-appearing.     Comments: Very pleasant male appears stated age in no acute distress sitting comfortably in exam room  HENT:      Head: Normocephalic and atraumatic.     Mouth/Throat:     Pharynx: No oropharyngeal exudate, posterior oropharyngeal erythema or uvula swelling.  Cardiovascular:     Rate and Rhythm: Normal rate and regular rhythm.     Heart sounds: Normal heart sounds, S1 normal and S2 normal. No murmur heard. Pulmonary:     Effort: Pulmonary effort is normal.     Breath sounds: Normal breath sounds. No stridor. No wheezing, rhonchi or rales.     Comments: Clear to auscultation bilaterally Abdominal:     General: Bowel sounds are normal.     Palpations: Abdomen is soft.     Tenderness: There is no abdominal tenderness. There is no right CVA tenderness, left CVA tenderness, guarding or rebound.  Musculoskeletal:     Cervical back: No tenderness or bony tenderness.     Thoracic back: Tenderness and bony tenderness present.     Lumbar back: No tenderness or bony tenderness.       Back:     Comments: Back: No pain percussion of vertebrae.  Normal active range of motion.  Tenderness over right thoracic paraspinal muscles.  Fixed nodule noted in the area of 10th posterior rib that is tender to palpation and site of patient's pain.  No overlying erythema or rash.  Neurological:     Mental Status: He is alert.  Psychiatric:        Behavior: Behavior is cooperative.      UC Treatments / Results  Labs (all labs ordered are listed, but only abnormal results are displayed) Labs Reviewed - No data to display  EKG   Radiology No results found.  Procedures Procedures (including critical care time)  Medications Ordered in UC Medications - No data to display  Initial Impression / Assessment and Plan / UC Course  I  have reviewed the triage vital signs and the nursing notes.  Pertinent labs & imaging results that were available during my care of the patient were reviewed by me and considered in my medical decision making (see chart for details).     Patient is well-appearing, afebrile, nontoxic,  nontachycardic.  X-ray was obtained given his tenderness over rib cage and my review of x-ray showed no acute osseous abnormality.  At the time of discharge we were waiting for radiologist over read and we will contact him if this is abnormal and changes our treatment plan.  We discussed symptoms are most likely related to myofascial pain.  He is limited in the medications that can be used given his cardiovascular history.  He can continue Tylenol over-the-counter as previously recommended.  Prescription for baclofen was sent to pharmacy and we discussed that this can be sedating and increases the risk of falls so he should limit use to before he is going to lay down.  He is not to drive or drink alcohol taking this medication.  He was also given prescription for lidocaine patches with instruction how to properly use these medicines.  Recommend close follow-up with primary care as he may benefit from seeing sport medicine.  Discussed if anything worsens or changes he is to return for reevaluation.  Strict return precautions given.  All questions answered patient satisfaction.  Final Clinical Impressions(s) / UC Diagnoses   Final diagnoses:  Myofascial pain  Acute right-sided thoracic back pain  Rib pain on right side     Discharge Instructions      I will contact you if your x-ray is abnormal and we need to change our treatment plan.  As we discussed, I believe that you have a muscle injury.  Take baclofen twice daily.  This will make you sleepy so do not drive or drink alcohol taking it and make sure to take it right before you are going to lay down so that you do not fall.  He can continue over-the-counter medications such as Tylenol for pain relief.  Apply lidocaine patch during the day and then remove this at night.  Use only 1 patch per 24 hours.  Follow-up with your primary care.  If anything worsens or changes please return for reevaluation including increasing pain, numbness or tingling, rash,  fever, chest pain, shortness of breath, worsening cough.     ED Prescriptions     Medication Sig Dispense Auth. Provider   lidocaine (LIDODERM) 5 % Place 1 patch onto the skin daily. Remove & Discard patch within 12 hours or as directed by MD 14 patch Jakson Delpilar K, PA-C   Baclofen 5 MG TABS Take 1 tablet (5 mg total) by mouth 2 (two) times daily as needed. 14 tablet Jocelyne Reinertsen, Noberto Retort, PA-C      PDMP not reviewed this encounter.   Jeani Hawking, PA-C 03/09/23 1456

## 2023-03-09 NOTE — Discharge Instructions (Signed)
I will contact you if your x-ray is abnormal and we need to change our treatment plan.  As we discussed, I believe that you have a muscle injury.  Take baclofen twice daily.  This will make you sleepy so do not drive or drink alcohol taking it and make sure to take it right before you are going to lay down so that you do not fall.  He can continue over-the-counter medications such as Tylenol for pain relief.  Apply lidocaine patch during the day and then remove this at night.  Use only 1 patch per 24 hours.  Follow-up with your primary care.  If anything worsens or changes please return for reevaluation including increasing pain, numbness or tingling, rash, fever, chest pain, shortness of breath, worsening cough.

## 2023-05-27 ENCOUNTER — Other Ambulatory Visit: Payer: Self-pay | Admitting: Cardiovascular Disease

## 2023-05-27 NOTE — Telephone Encounter (Signed)
Can you please reach out and Schedule 12 month follow up.   Thanks!

## 2023-05-27 NOTE — Telephone Encounter (Signed)
Pt is scheduled on 1.17

## 2023-06-17 ENCOUNTER — Encounter: Payer: Self-pay | Admitting: Nurse Practitioner

## 2023-06-17 ENCOUNTER — Ambulatory Visit: Payer: Medicare Other | Attending: Nurse Practitioner | Admitting: Nurse Practitioner

## 2023-06-17 VITALS — BP 124/64 | HR 65 | Ht 71.0 in | Wt 205.4 lb

## 2023-06-17 DIAGNOSIS — I10 Essential (primary) hypertension: Secondary | ICD-10-CM | POA: Insufficient documentation

## 2023-06-17 DIAGNOSIS — I200 Unstable angina: Secondary | ICD-10-CM | POA: Diagnosis present

## 2023-06-17 DIAGNOSIS — I480 Paroxysmal atrial fibrillation: Secondary | ICD-10-CM | POA: Insufficient documentation

## 2023-06-17 DIAGNOSIS — E785 Hyperlipidemia, unspecified: Secondary | ICD-10-CM | POA: Diagnosis present

## 2023-06-17 DIAGNOSIS — I2510 Atherosclerotic heart disease of native coronary artery without angina pectoris: Secondary | ICD-10-CM | POA: Insufficient documentation

## 2023-06-17 DIAGNOSIS — I25118 Atherosclerotic heart disease of native coronary artery with other forms of angina pectoris: Secondary | ICD-10-CM

## 2023-06-17 MED ORDER — NITROGLYCERIN 0.4 MG SL SUBL: 0.4000 mg | SUBLINGUAL_TABLET | SUBLINGUAL | 3 refills | Status: AC | PRN

## 2023-06-17 NOTE — Progress Notes (Signed)
Office Visit    Patient Name: Christopher [name] Date of Encounter: 06/17/2023  Primary Care Provider:  Marina Goodell, MD Primary Cardiologist:  Julien Nordmann, MD  Chief Complaint    89 y.o. male  with a history of carotid artery stenosis, CAD s/p PCI/DES to mLAD and dRCA in 2005, CABG x3 in 2016, DM2, HTN, HLD, GERD, PSVT, freq PVCs, periop Afib (2016), and PVD, presenting for CAD f/u.  Past Medical History  Subjective   Past Medical History:  Diagnosis Date   Atrial fibrillation (HCC)    a. 08/2014 in setting of NSTEMI - occurred immediately prior to CABG. Managed w/ short-term amio/OAC. No known recurrence.   Carotid artery stenosis    a. 10/2010 Carotid U/S: 1-49% bilat ICA stenoses.   Coronary artery disease    a. 2005 s/p PCI/DES to mLAD and dRCA; b. 09/17/2014 Cath: critical ostial/prox LAD estimated at 90-95%, critical prox LCx, RCA w/ mild diff dz, EF >55%; c. 08/2014 CABG x 3: LIMA->LAD, VG->OM, VG->RPDA.   Diastolic dysfunction    a. 03/2021 Echo: EF 55%, no rwma, GrII DD. Nl RV fxn. Mildly dil LA. Mild MR; b. 11/2021 Echo: EF 55-60%, no rwma, GrI DD, nl RV size/fxn. Mildly dil LA. AoV sclerosis w/o stenosis.   GERD (gastroesophageal reflux disease)    Hyperlipidemia    Hypertension    PSVT (paroxysmal supraventricular tachycardia) (HCC)    a. 07/2020 Zio: Predominantly sinus rhythm. 2 runs NSVT/Atrial Tachycardia.  17 runs SVT, fastest 10 beats @ 150. Longest 12 beats @ 121. Isolated PVCs (20.7% burden). Triggered events = PACs/PVCs.   PVC's (premature ventricular contractions)    a. 07/2020 Zio: [id number] PVCs over 14 days (20.7% burden)->managed w/ low-dose metoprolol.   PVD (peripheral vascular disease) [facility])    Past Surgical History:  Procedure Laterality Date   CARDIAC CATHETERIZATION     CHOLECYSTECTOMY  2016   COLONOSCOPY WITH PROPOFOL N/A 12/17/2021   Procedure: COLONOSCOPY WITH PROPOFOL;  Surgeon: Wyline Mood, MD;  Location: [facility] ENDOSCOPY;  Service:  Gastroenterology;  Laterality: N/A;   CORONARY ARTERY BYPASS GRAFT N/A 09/20/2014   Procedure: CORONARY ARTERY BYPASS GRAFTING (CABG) x   three using left internal mammary artery and right leg greater saphenous vein harvested endoscopically;  Surgeon: Loreli Slot, MD;  Location: [facility] OR;  Service: Open Heart Surgery;  Laterality: N/A;   TEE WITHOUT CARDIOVERSION N/A 09/20/2014   Procedure: TRANSESOPHAGEAL ECHOCARDIOGRAM (TEE);  Surgeon: Loreli Slot, MD;  Location: [facility] OR;  Service: Open Heart Surgery;  Laterality: N/A;   TOTAL KNEE ARTHROPLASTY     bilateral    Allergies  No Known Allergies    History of Present Illness      89 y.o. y/o male with a history of carotid artery stenosis, CAD s/p CABG x3 in 2016, DM2, HTN, HLD, GERD, PSVT, freq PVCs, periop Afib (2016), and PVD.  Cardiac hx dates back to 2005, when he underwent PCI/DES to the LAD and RCA.  In 08/2014, he suffered a NSTEMI and was found to have critical ostial LAD and proximal LCX dzs on cath.  He was planned for CABG and prior to CABG, developed Afib w/ RVR requiring amio.  He underwent successful CABG x 3 and was d/c'd in sinus rhythm.  He was maintained on amio and oral anticoagulation for a short-course following discharge, and both were subsequently d/c'd.  He has no known recurrence of afib, however, in the setting of dizziness in March 2022, he  wore a Zio monitor and it showed 17 brief runs of PSVT as well as a 20.7% PVC burden.  He was placed on metoprolol 12.5mg  BID and an echo was ordered, but pt felt better and didn't carry through w/ echo until 03/2021, which showed an EF of 55% w/ grade II diast dysfxn.   In July 2023, Christopher [name] was admitted with vertiginous symptoms.  MRI of the brain showed minimal chronic small vessel ischemic changes within the cerebral white matter with tiny chronic infarcts within the right cerebellar hemisphere without acute intracranial abnormalities.  He was placed on aspirin and Plavix  per neurology.  Echocardiogram showed normal LV function with grade 1 diastolic dysfunction and without significant valvular disease.  Aspirin subsequently discontinued by neurology in the outpatient setting.  Subsequent event monitoring showed predominantly sinus rhythm with 27 brief runs of SVT and occasional PACs and PVCs.  Triggered events were associated with sinus rhythm.  No A-fib or flutter was noted.    Christopher [name] was last seen in cardiology clinic in November 2023 at which time he was doing well.  For the most part, he had continued to do well until about a week ago, when he went outside to walk in the cold and developed a substernal chest pressure and dyspnea after walking for about 5 minutes.  He went back inside and rested and symptoms resolved within 5 minutes.  Since then, he has had similar symptoms every day with walking outside.  There is an incline to his driveway, which contributes to this.  When he walks in his home, and a flat surface and normal temperature, he has not had chest pain.  He denies palpitations, PND, orthopnea, dizziness, syncope, edema, or early satiety. Objective  Home Medications    Current Outpatient Medications  Medication Sig Dispense Refill   Baclofen 5 MG TABS Take 1 tablet (5 mg total) by mouth 2 (two) times daily as needed. (Patient not taking: Reported on 06/17/2023) 14 tablet 0   cholecalciferol (VITAMIN D3) 25 MCG (1000 UNIT) tablet Take 1,000 Units by mouth daily.     clopidogrel (PLAVIX) 75 MG tablet TAKE 1 TABLET BY MOUTH EVERY DAY 30 tablet 0   dapagliflozin propanediol (FARXIGA) 5 MG TABS tablet Take by mouth.     gabapentin (NEURONTIN) 100 MG capsule Take 100 mg by mouth 3 (three) times daily.     lidocaine (LIDODERM) 5 % Place 1 patch onto the skin daily. Remove & Discard patch within 12 hours or as directed by MD 14 patch 0   lisinopril (ZESTRIL) 2.5 MG tablet Take 2.5 mg by mouth daily.     Magnesium 250 MG TABS Take by mouth daily.      metFORMIN (GLUCOPHAGE-XR) 500 MG 24 hr tablet Take 2 tablets (1,000 mg total) by mouth 2 (two) times daily with a meal. 60 tablet 3   metoprolol tartrate (LOPRESSOR) 25 MG tablet Take 0.5 tablets (12.5 mg total) by mouth 2 (two) times daily. 90 tablet 1   omeprazole (PRILOSEC) 20 MG capsule Take 20 mg by mouth daily.     predniSONE (STERAPRED UNI-PAK 21 TAB) 10 MG (21) TBPK tablet Take 6 tablets on day 1, 5 tablets day 2, 4 tablets day 3, 3 tablets day 4, 2 tablets day 5, 1 tablet day 6 (Patient not taking: Reported on 06/17/2023) 21 tablet 0   rosuvastatin (CRESTOR) 5 MG tablet Take 5 mg by mouth daily.     No current facility-administered medications for this  visit.     Physical Exam    VS:  BP (!) 142/62   Pulse 65   Ht 5\' 11"  (1.803 m)   Wt 205 lb 6.4 oz (93.2 kg)   SpO2 99%   BMI 28.65 kg/m  , BMI Body mass index is 28.65 kg/m.     Vitals:   06/17/23 0813 06/17/23 1342  BP: (!) 142/62 124/64  Pulse: 65   SpO2: 99%       GEN: Well nourished, well developed, in no acute distress. HEENT: normal. Neck: Supple, no JVD, carotid bruits, or masses. Cardiac: RRR, no murmurs, rubs, or gallops. No clubbing, cyanosis, edema.  Radials 2+/PT 2+ and equal bilaterally.  Respiratory:  Respirations regular and unlabored, clear to auscultation bilaterally. GI: Soft, nontender, nondistended, BS + x 4. MS: no deformity or atrophy. Skin: warm and dry, no rash. Neuro:  Strength and sensation are intact. Psych: Normal affect.  Accessory Clinical Findings    ECG personally reviewed by me today - EKG Interpretation Date/Time:  Friday June 17 2023 08:18:54 EST Ventricular Rate:  65 PR Interval:  164 QRS Duration:  90 QT Interval:  374 QTC Calculation: 388 R Axis:   50  Text Interpretation: Normal sinus rhythm Nonspecific T wave abnormality Confirmed by Nicolasa Ducking [phone]) on 06/17/2023 8:56:16 AM  - no acute changes.  Labs from Care Everywhere dated July 2024:  Total  cholesterol 115, triglycerides 136, HDL 41, LDL 47 Sodium 135, potassium 4.5, chloride 99, CO2 31.1, BUN 17, creatinine 1.1, glucose 189 Total bilirubin 0.8, alkaline phosphatase 57, AST 14, ALT 14 Calcium 9.2, total protein 6.6, albumin 4.5 Hemoglobin A1c 8.3    Assessment & Plan    1.  Unstable angina/coronary artery disease: Status post CABG in 2016.  Was doing well until about a week ago, when he started having exertional substernal chest pressure with dyspnea, lasting about 5 minutes, resolving with rest.  He is not sure if this is similar to prior angina.  ECG unchanged today.  We will pursue a Lexiscan PET/CT to rule out ischemia.  He remains on beta-blocker, statin, ACE inhibitor, and Plavix.  Will send in a prescription for sublingual nitroglycerin.  Follow-up in clinic in 3 weeks. Informed Consent   Shared Decision Making/Informed Consent The risks [chest pain, shortness of breath, cardiac arrhythmias, dizziness, blood pressure fluctuations, myocardial infarction, stroke/transient ischemic attack, nausea, vomiting, allergic reaction, radiation exposure, metallic taste sensation and life-threatening complications (estimated to be 1 in 10,000)], benefits (risk stratification, diagnosing coronary artery disease, treatment guidance) and alternatives of a cardiac PET stress test were discussed in detail with Christopher [name] and he agrees to proceed.      2.  Primary hypertension: Blood pressure initially elevated at 142/62 but came down to 124/64 on follow-up.  Continue current doses of beta-blocker and ACE inhibitor.  3.  Hyperlipidemia: LDL of 47 with normal LFTs in July 2024.  Continue statin therapy.  4.  Paroxysmal atrial fibrillation: In the setting of non-STEMI and CABG in 2016.  No known recurrence-not on anticoagulation.  Continue beta-blocker.  5.  History of TIA: Prior event monitoring did not show any A-fib or a flutter.  No recurrent TIAs.  He is on chronic clopidogrel.  6.  Type 2  diabetes mellitus: A1c of 8.3 in July 2024.  He is on metformin, ACE inhibitor, and statin.  Followed by primary care.  7.  Disposition: Follow-up Lexiscan PET/CT to rule out ischemia.  Follow-up in clinic in 3  weeks or sooner if necessary.   Nicolasa Ducking, NP 06/17/2023, 1:35 PM

## 2023-06-17 NOTE — Patient Instructions (Signed)
Medication Instructions:  - Your physician recommends that you continue on your current medications as directed. Please refer to the Current Medication list given to you today.  *If you need a refill on your cardiac medications before your next appointment, please call your pharmacy*   Lab Work: - none ordered  If you have labs (blood work) drawn today and your tests are completely normal, you will receive your results only by: MyChart Message (if you have MyChart) OR A paper copy in the mail If you have any lab test that is abnormal or we need to change your treatment, we will call you to review the results.   Testing/Procedures:  1) Cardiac PET (Perfusion) Study:   Please report to Radiology at [facility] Main Entrance, [facility], 30 mins prior to your test.  [address]  [place], [place]  [phone]  How to Prepare for Your Cardiac PET/CT Stress Test:  Nothing to eat or drink, except water, 3 hours prior to arrival time.  NO caffeine/decaffeinated products, or chocolate 12 hours prior to arrival. (Please note decaffeinated beverages (teas/coffees) still contain caffeine).  If you have caffeine within 12 hours prior, the test will need to be rescheduled.  Medication instructions: You may take your medications with water the morning of your test, unless unable to eat prior to the 3 hour fasting window (see below regarding diabetic meds)  Diabetic Preparation: If able to eat breakfast prior to 3 hour fasting, you may take all medications, including your insulin. Do not worry if you miss your breakfast dose of insulin - start at your next meal. If you do not eat prior to 3 hour fast-Hold all diabetes (oral and insulin) medications. Patients who wear a continuous glucose monitor MUST remove the device prior to scanning.    NO perfume, cologne or lotion on chest or abdomen area.   Total time is 1 to 2 hours; you may want to bring reading  material for the waiting time.  IF YOU THINK YOU MAY BE PREGNANT, OR ARE NURSING PLEASE INFORM THE TECHNOLOGIST.  In preparation for your appointment, medication and supplies will be purchased.  Appointment availability is limited, so if you need to cancel or reschedule, please call the Radiology Department at [phone] [facility]) OR [phone] [facility]) 24 hours in advance to avoid a cancellation fee of $100.00  What to Expect When you Arrive:  Once you arrive and check in for your appointment, you will be taken to a preparation room within the Radiology Department.  A technologist or Nurse will obtain your medical history, verify that you are correctly prepped for the exam, and explain the procedure.  Afterwards, an IV will be started in your arm and electrodes will be placed on your skin for EKG monitoring during the stress portion of the exam. Then you will be escorted to the PET/CT scanner.  There, staff will get you positioned on the scanner and obtain a blood pressure and EKG.  During the exam, you will continue to be connected to the EKG and blood pressure machines.  A small, safe amount of a radioactive tracer will be injected in your IV to obtain a series of pictures of your heart along with an injection of a stress agent.    After your Exam:  It is recommended that you eat a meal and drink a caffeinated beverage to counter act any effects of the stress agent.  Drink plenty of fluids for the remainder of the  day and urinate frequently for the first couple of hours after the exam.  Your doctor will inform you of your test results within 7-10 business days.  For more information and frequently asked questions, please visit our website: [email or web address]  For questions about your test or how to prepare for your test, please call: Cardiac Imaging Nurse Navigators Office: [phone]    Follow-Up: At [facility], you and your health needs are our priority.   As part of our continuing mission to provide you with exceptional heart care, we have created designated Provider Care Teams.  These Care Teams include your primary Cardiologist (physician) and Advanced Practice Providers (APPs -  Physician Assistants and Nurse Practitioners) who all work together to provide you with the care you need, when you need it.  We recommend signing up for the patient portal called "MyChart".  Sign up information is provided on this After Visit Summary.  MyChart is used to connect with patients for Virtual Visits (Telemedicine).  Patients are able to view lab/test results, encounter notes, upcoming appointments, etc.  Non-urgent messages can be sent to your provider as well.   To learn more about what you can do with MyChart, go to [email or web address].    Your next appointment:   3 week(s)  Provider:   You may see Julien Nordmann, MD or one of the following Advanced Practice Providers on your designated Care Team:   Nicolasa Ducking, NP   Other Instructions

## 2023-06-18 ENCOUNTER — Other Ambulatory Visit: Payer: Self-pay | Admitting: Cardiovascular Disease

## 2023-06-20 ENCOUNTER — Telehealth: Payer: Self-pay | Admitting: Cardiovascular Disease

## 2023-06-20 ENCOUNTER — Telehealth (HOSPITAL_COMMUNITY): Payer: Self-pay | Admitting: Emergency Medicine

## 2023-06-20 ENCOUNTER — Encounter (HOSPITAL_COMMUNITY): Payer: Self-pay

## 2023-06-20 NOTE — Telephone Encounter (Signed)
   Pt c/o of Chest Pain: STAT if active CP, including tightness, pressure, jaw pain, radiating pain to shoulder/upper arm/back, CP unrelieved by Nitro. Symptoms reported of SOB, nausea, vomiting, sweating.  1. Are you having CP right now? Yes    2. Are you experiencing any other symptoms (ex. SOB, nausea, vomiting, sweating)? Tightness, pressure, left arm, SOB, dizziness   3. Is your CP continuous or coming and going? continuous   4. Have you taken Nitroglycerin? No    5. How long have you been experiencing CP? This morning    6. If NO CP at time of call then end call with telling Pt to call back or call 911 if Chest pain returns prior to return call from triage team.

## 2023-06-20 NOTE — Telephone Encounter (Signed)
Call received and spoke with patient. He reports chest heaviness which does resolve but the pain returns. He states that he is not able to wait so long for his testing to be done. Instructed him to proceed to the ED for further evaluation and that I will reach out to see if there are any sooner appointments for his testing but that I do want him to go to ED. He verbalized understanding.   Called contact person for scheduling of the PET scan's. They do not have anything sooner.

## 2023-06-20 NOTE — Telephone Encounter (Signed)
Reaching out to patient to offer assistance regarding upcoming cardiac imaging study; pt verbalizes understanding of appt date/time, parking situation and where to check in, pre-test NPO status and medications ordered, and verified current allergies; name and call back number provided for further questions should they arise Cayne Yom RN Navigator Cardiac Imaging [facility] [phone] office [phone] cell 

## 2023-06-20 NOTE — Telephone Encounter (Signed)
Returned call to patient and reviewed that there are no sooner appointments both here and in [place]. Instructed him to proceed to local ED for further evaluation. He verbalized understanding with no further questions at this time.

## 2023-06-21 ENCOUNTER — Ambulatory Visit (HOSPITAL_COMMUNITY)
Admission: RE | Admit: 2023-06-21 | Discharge: 2023-06-21 | Disposition: A | Discharge status: Home / Self Care | Payer: Medicare Other | Source: Ambulatory Visit | Attending: Nurse Practitioner | Admitting: Nurse Practitioner

## 2023-06-21 DIAGNOSIS — I200 Unstable angina: Secondary | ICD-10-CM | POA: Diagnosis present

## 2023-06-21 DIAGNOSIS — I2510 Atherosclerotic heart disease of native coronary artery without angina pectoris: Secondary | ICD-10-CM | POA: Diagnosis present

## 2023-06-21 DIAGNOSIS — I25110 Atherosclerotic heart disease of native coronary artery with unstable angina pectoris: Secondary | ICD-10-CM

## 2023-06-21 LAB — NM PET CT CARDIAC PERFUSION MULTI W/ABSOLUTE BLOODFLOW
LV dias vol: 105 mL (ref 62–150)
LV sys vol: 56 mL
MBFR: 1.43
Nuc Rest EF: 47 %
Nuc Stress EF: 56 %
Peak HR: 78 {beats}/min
Rest HR: 64 {beats}/min
Rest MBF: 0.67 ml/g/min
Rest Nuclear Isotope Dose: 24 mCi
SDS: 14
ST Depression (mm): 0.5 mm
Stress MBF: 0.96 ml/g/min
Stress Nuclear Isotope Dose: 24 mCi

## 2023-06-21 MED ORDER — REGADENOSON 0.4 MG/5ML IV SOLN
INTRAVENOUS | Status: AC
  Filled 2023-06-21: qty 5

## 2023-06-21 MED ORDER — REGADENOSON 0.4 MG/5ML IV SOLN
0.4000 mg | Freq: Once | INTRAVENOUS | Status: AC
  Administered 2023-06-21: 0.4 mg via INTRAVENOUS

## 2023-06-21 MED ORDER — RUBIDIUM RB82 GENERATOR (RUBYFILL)
25.0000 | PACK | Freq: Once | INTRAVENOUS | Status: AC
  Administered 2023-06-21: 24.01 via INTRAVENOUS

## 2023-06-22 ENCOUNTER — Telehealth: Payer: Self-pay | Admitting: Cardiovascular Disease

## 2023-06-22 NOTE — Telephone Encounter (Signed)
Called patient with results  Per Ward Givens, NP:  Abnormal stress test with evidence of poor blood flow to the heart muscle.  Heart squeeze is mildly reduced at rest.  Based on results and recent symptoms, cardiac catheterization is appropriate.  He doesn't have f/u scheduled until 2/13.  If he's willing to be seen sooner, pls arrange for f/u with me or Dr. Mariah Milling sooner (if I don't have openings this week, I can see next Mon, Tues, or Wed @ 2p - hosp days).     Patient verbalized understanding, scheduled with Dr.Gollan for next Tuesday 01/28. Patient aware of appointment date and time.

## 2023-06-22 NOTE — Telephone Encounter (Signed)
Patient was returning call. Please advise ?
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
## 2023-06-28 ENCOUNTER — Other Ambulatory Visit: Payer: Self-pay | Admitting: Cardiovascular Disease

## 2023-06-28 ENCOUNTER — Ambulatory Visit: Payer: Medicare Other | Attending: Cardiovascular Disease | Admitting: Cardiovascular Disease

## 2023-06-28 ENCOUNTER — Encounter: Payer: Self-pay | Admitting: Cardiovascular Disease

## 2023-06-28 VITALS — BP 150/60 | HR 65 | Ht 71.0 in | Wt 206.0 lb

## 2023-06-28 DIAGNOSIS — I480 Paroxysmal atrial fibrillation: Secondary | ICD-10-CM | POA: Insufficient documentation

## 2023-06-28 DIAGNOSIS — I2510 Atherosclerotic heart disease of native coronary artery without angina pectoris: Secondary | ICD-10-CM | POA: Insufficient documentation

## 2023-06-28 DIAGNOSIS — I25118 Atherosclerotic heart disease of native coronary artery with other forms of angina pectoris: Secondary | ICD-10-CM

## 2023-06-28 DIAGNOSIS — I10 Essential (primary) hypertension: Secondary | ICD-10-CM | POA: Insufficient documentation

## 2023-06-28 DIAGNOSIS — Z79899 Other long term (current) drug therapy: Secondary | ICD-10-CM | POA: Diagnosis present

## 2023-06-28 DIAGNOSIS — R9439 Abnormal result of other cardiovascular function study: Secondary | ICD-10-CM | POA: Insufficient documentation

## 2023-06-28 DIAGNOSIS — I739 Peripheral vascular disease, unspecified: Secondary | ICD-10-CM | POA: Insufficient documentation

## 2023-06-28 DIAGNOSIS — Z951 Presence of aortocoronary bypass graft: Secondary | ICD-10-CM | POA: Insufficient documentation

## 2023-06-28 DIAGNOSIS — E782 Mixed hyperlipidemia: Secondary | ICD-10-CM | POA: Insufficient documentation

## 2023-06-28 DIAGNOSIS — E785 Hyperlipidemia, unspecified: Secondary | ICD-10-CM | POA: Insufficient documentation

## 2023-06-28 NOTE — Patient Instructions (Addendum)
Medication Instructions:  No changes  If you need a refill on your cardiac medications before your next appointment, please call your pharmacy.   Lab work: Your provider would like for you to have following labs drawn today CBC and BMP.    Testing/Procedures:  [place] [facility] DEPT OF [facility] [place] [address] [place] [address] Dept: [phone] Loc: [phone]  Christopher [name]  06/28/2023  You are scheduled for a Cardiac Catheterization on Friday, February 7 with Dr. Lorine Bears.  1. Please arrive at the Heart & Vascular Center Entrance of [facility], [address] [place], [place] [address] at 6:30 AM (This is 1 hour(s) prior to your procedure time).  Proceed to the Check-In Desk directly inside the entrance.  Procedure Parking: Use the entrance off of the [address] side of the hospital. Turn right upon entering and follow the driveway to parking that is directly in front of the Heart & Vascular Center. There is no valet parking available at this entrance, however there is an awning directly in front of the Heart & Vascular Center for drop off/ pick up for patients.  Special note: Every effort is made to have your procedure done on time. Please understand that emergencies sometimes delay scheduled procedures.  2. Diet: Do not eat solid foods after midnight.  The patient may have clear liquids until 5am upon the day of the procedure.   4. Medication instructions in preparation for your procedure:   Contrast Allergy: No  Please hold Farxiga for 3 days prior to heart catheterization.  Please hold Metformin the morning of procedure and for 48 hours after procedure.  On the morning of your procedure, take your Plavix/Clopidogrel and any morning medicines NOT listed above.  You may use sips of water.  5. Plan to go home the same day, you will only stay overnight if medically necessary. 6.  Bring a current list of your medications and current insurance cards. 7. You MUST have a responsible person to drive you home. 8. Someone MUST be with you the first 24 hours after you arrive home or your discharge will be delayed. 9. Please wear clothes that are easy to get on and off and wear slip-on shoes.  Thank you for allowing [place] to care for you!   -- [facility]   Follow-Up: At [facility], you and your health needs are our priority.  As part of our continuing mission to provide you with exceptional heart care, we have created designated Provider Care Teams.  These Care Teams include your primary Cardiologist (physician) and Advanced Practice Providers (APPs -  Physician Assistants and Nurse Practitioners) who all work together to provide you with the care you need, when you need it. Please keep your current appointment on 07/14/23. You will need a follow up appointment in 6 months  Providers on your designated Care Team:   Christopher Ducking, NP Christopher Listen, PA-C Christopher [name], [place]  COVID-19 Vaccine Information can be found at: PodExchange.[email or web address] For questions related to vaccine distribution or appointments, please email vaccine@[email or web address] .com or call [phone].

## 2023-06-29 LAB — BASIC METABOLIC PANEL
BUN/Creatinine Ratio: 17 (ref 10–24)
BUN: 18 mg/dL (ref 8–27)
CO2: 21 mmol/L (ref 20–29)
Calcium: 9.3 mg/dL (ref 8.6–10.2)
Chloride: 104 mmol/L (ref 96–106)
Creatinine, Ser: 1.03 mg/dL (ref 0.76–1.27)
Glucose: 252 mg/dL — ABNORMAL HIGH (ref 70–99)
Potassium: 4.5 mmol/L (ref 3.5–5.2)
Sodium: 141 mmol/L (ref 134–144)
eGFR: 69 mL/min/{1.73_m2} (ref >59)

## 2023-06-29 LAB — CBC
Hematocrit: 45.3 % (ref 37.5–51.0)
Hemoglobin: 15.2 g/dL (ref 13.0–17.7)
MCH: 31 pg (ref 26.6–33.0)
MCHC: 33.6 g/dL (ref 31.5–35.7)
MCV: 92 fL (ref 79–97)
Platelets: 126 10*3/uL — ABNORMAL LOW (ref 150–450)
RBC: 4.91 x10E6/uL (ref 4.14–5.80)
RDW: 13 % (ref 11.6–15.4)
WBC: 7.3 10*3/uL (ref 3.4–10.8)

## 2023-07-01 ENCOUNTER — Other Ambulatory Visit: Payer: Self-pay | Admitting: Cardiovascular Disease

## 2023-07-01 ENCOUNTER — Encounter: Payer: Self-pay | Admitting: Cardiovascular Disease

## 2023-07-08 ENCOUNTER — Ambulatory Visit
Admission: RE | Admit: 2023-07-08 | Discharge: 2023-07-08 | Disposition: A | Discharge status: Home / Self Care | Payer: Medicare Other | Attending: Cardiovascular Disease | Admitting: Cardiovascular Disease

## 2023-07-08 ENCOUNTER — Encounter: Payer: Self-pay | Admitting: Cardiovascular Disease

## 2023-07-08 ENCOUNTER — Encounter
Admission: RE | Disposition: A | Discharge status: Home / Self Care | Payer: Self-pay | Source: Home / Self Care | Attending: Cardiovascular Disease

## 2023-07-08 ENCOUNTER — Other Ambulatory Visit: Payer: Self-pay

## 2023-07-08 DIAGNOSIS — Z951 Presence of aortocoronary bypass graft: Secondary | ICD-10-CM | POA: Diagnosis not present

## 2023-07-08 DIAGNOSIS — E1151 Type 2 diabetes mellitus with diabetic peripheral angiopathy without gangrene: Secondary | ICD-10-CM | POA: Diagnosis not present

## 2023-07-08 DIAGNOSIS — R9439 Abnormal result of other cardiovascular function study: Secondary | ICD-10-CM | POA: Diagnosis present

## 2023-07-08 DIAGNOSIS — Z955 Presence of coronary angioplasty implant and graft: Secondary | ICD-10-CM | POA: Diagnosis not present

## 2023-07-08 DIAGNOSIS — Z7901 Long term (current) use of anticoagulants: Secondary | ICD-10-CM | POA: Diagnosis not present

## 2023-07-08 DIAGNOSIS — Z7984 Long term (current) use of oral hypoglycemic drugs: Secondary | ICD-10-CM | POA: Diagnosis not present

## 2023-07-08 DIAGNOSIS — Z87891 Personal history of nicotine dependence: Secondary | ICD-10-CM | POA: Insufficient documentation

## 2023-07-08 DIAGNOSIS — E785 Hyperlipidemia, unspecified: Secondary | ICD-10-CM | POA: Insufficient documentation

## 2023-07-08 DIAGNOSIS — I10 Essential (primary) hypertension: Secondary | ICD-10-CM | POA: Diagnosis not present

## 2023-07-08 DIAGNOSIS — I4891 Unspecified atrial fibrillation: Secondary | ICD-10-CM | POA: Insufficient documentation

## 2023-07-08 DIAGNOSIS — K219 Gastro-esophageal reflux disease without esophagitis: Secondary | ICD-10-CM | POA: Diagnosis not present

## 2023-07-08 DIAGNOSIS — Z7902 Long term (current) use of antithrombotics/antiplatelets: Secondary | ICD-10-CM | POA: Insufficient documentation

## 2023-07-08 DIAGNOSIS — Z79899 Other long term (current) drug therapy: Secondary | ICD-10-CM | POA: Diagnosis not present

## 2023-07-08 DIAGNOSIS — I493 Ventricular premature depolarization: Secondary | ICD-10-CM | POA: Diagnosis not present

## 2023-07-08 DIAGNOSIS — I25118 Atherosclerotic heart disease of native coronary artery with other forms of angina pectoris: Secondary | ICD-10-CM | POA: Diagnosis not present

## 2023-07-08 DIAGNOSIS — I2582 Chronic total occlusion of coronary artery: Secondary | ICD-10-CM | POA: Diagnosis not present

## 2023-07-08 DIAGNOSIS — G459 Transient cerebral ischemic attack, unspecified: Secondary | ICD-10-CM | POA: Diagnosis not present

## 2023-07-08 HISTORY — PX: LEFT HEART CATH AND CORS/GRAFTS ANGIOGRAPHY: CATH118250

## 2023-07-08 LAB — GLUCOSE, CAPILLARY
Glucose-Capillary: 211 mg/dL — ABNORMAL HIGH (ref 70–99)
Glucose-Capillary: 206 mg/dL — ABNORMAL HIGH (ref 70–99)

## 2023-07-08 SURGERY — LEFT HEART CATH AND CORS/GRAFTS ANGIOGRAPHY
Anesthesia: Moderate Sedation | Laterality: Left

## 2023-07-08 SURGERY — LEFT HEART CATH AND CORONARY ANGIOGRAPHY
Anesthesia: Moderate Sedation | Laterality: Left

## 2023-07-08 MED ORDER — HEPARIN (PORCINE) IN NACL 1000-0.9 UT/500ML-% IV SOLN
INTRAVENOUS | Status: AC
  Filled 2023-07-08: qty 1000

## 2023-07-08 MED ORDER — FENTANYL CITRATE (PF) 100 MCG/2ML IJ SOLN
INTRAMUSCULAR | Status: DC | PRN
  Administered 2023-07-08: 25 ug via INTRAVENOUS

## 2023-07-08 MED ORDER — HEPARIN (PORCINE) IN NACL 1000-0.9 UT/500ML-% IV SOLN
INTRAVENOUS | Status: DC | PRN
  Administered 2023-07-08 (×2): 500 mL

## 2023-07-08 MED ORDER — SODIUM CHLORIDE 0.9 % WEIGHT BASED INFUSION
3.0000 mL/kg/h | INTRAVENOUS | Status: AC
  Administered 2023-07-08: 3 mL/kg/h via INTRAVENOUS

## 2023-07-08 MED ORDER — VERAPAMIL HCL 2.5 MG/ML IV SOLN
INTRAVENOUS | Status: AC
  Filled 2023-07-08: qty 2

## 2023-07-08 MED ORDER — ONDANSETRON HCL 4 MG/2ML IJ SOLN: 4.0000 mg | Freq: Four times a day (QID) | INTRAMUSCULAR | Status: DC | PRN

## 2023-07-08 MED ORDER — SODIUM CHLORIDE 0.9 % IV SOLN: 250.0000 mL | INTRAVENOUS | Status: DC | PRN

## 2023-07-08 MED ORDER — VERAPAMIL HCL 2.5 MG/ML IV SOLN
INTRAVENOUS | Status: DC | PRN
  Administered 2023-07-08: 2.5 mg via INTRA_ARTERIAL

## 2023-07-08 MED ORDER — SODIUM CHLORIDE 0.9% FLUSH: 3.0000 mL | INTRAVENOUS | Status: DC | PRN

## 2023-07-08 MED ORDER — ACETAMINOPHEN 325 MG PO TABS: 650.0000 mg | ORAL_TABLET | ORAL | Status: DC | PRN

## 2023-07-08 MED ORDER — MIDAZOLAM HCL 2 MG/2ML IJ SOLN
INTRAMUSCULAR | Status: AC
  Filled 2023-07-08: qty 2

## 2023-07-08 MED ORDER — SODIUM CHLORIDE 0.9% FLUSH: 3.0000 mL | Freq: Two times a day (BID) | INTRAVENOUS | Status: DC

## 2023-07-08 MED ORDER — HEPARIN SODIUM (PORCINE) 1000 UNIT/ML IJ SOLN
INTRAMUSCULAR | Status: AC
  Filled 2023-07-08: qty 10

## 2023-07-08 MED ORDER — ASPIRIN 81 MG PO CHEW
CHEWABLE_TABLET | ORAL | Status: AC
  Filled 2023-07-08: qty 1

## 2023-07-08 MED ORDER — SODIUM CHLORIDE 0.9 % WEIGHT BASED INFUSION: 1.0000 mL/kg/h | INTRAVENOUS | Status: DC

## 2023-07-08 MED ORDER — HEPARIN SODIUM (PORCINE) 1000 UNIT/ML IJ SOLN
INTRAMUSCULAR | Status: DC | PRN
  Administered 2023-07-08: 5000 [IU] via INTRAVENOUS

## 2023-07-08 MED ORDER — ASPIRIN 81 MG PO CHEW
81.0000 mg | CHEWABLE_TABLET | ORAL | Status: AC
  Administered 2023-07-08: 81 mg via ORAL

## 2023-07-08 MED ORDER — SODIUM CHLORIDE 0.9 % IV SOLN: INTRAVENOUS | Status: DC

## 2023-07-08 MED ORDER — MIDAZOLAM HCL 2 MG/2ML IJ SOLN
INTRAMUSCULAR | Status: DC | PRN
  Administered 2023-07-08: 1 mg via INTRAVENOUS

## 2023-07-08 MED ORDER — FENTANYL CITRATE (PF) 100 MCG/2ML IJ SOLN
INTRAMUSCULAR | Status: AC
  Filled 2023-07-08: qty 2

## 2023-07-08 MED ORDER — LIDOCAINE HCL (PF) 1 % IJ SOLN
INTRAMUSCULAR | Status: DC | PRN
  Administered 2023-07-08: 2 mL

## 2023-07-08 MED ORDER — IOHEXOL 300 MG/ML  SOLN
INTRAMUSCULAR | Status: DC | PRN
  Administered 2023-07-08: 100 mL

## 2023-07-08 SURGICAL SUPPLY — 11 items
CATH INFINITI 5 FR IM (CATHETERS) IMPLANT
CATH INFINITI AMBI 5FR TG (CATHETERS) IMPLANT
DEVICE RAD TR BAND REGULAR (VASCULAR PRODUCTS) IMPLANT
DRAPE BRACHIAL (DRAPES) IMPLANT
GLIDESHEATH SLEND SS 6F .021 (SHEATH) IMPLANT
GUIDEWIRE INQWIRE 1.5J.035X260 (WIRE) IMPLANT
INQWIRE 1.5J .035X260CM (WIRE) ×1
PACK CARDIAC CATH (CUSTOM PROCEDURE TRAY) ×1 IMPLANT
PROTECTION STATION PRESSURIZED (MISCELLANEOUS) ×1
SET ATX-X65L (MISCELLANEOUS) IMPLANT
STATION PROTECTION PRESSURIZED (MISCELLANEOUS) IMPLANT

## 2023-07-08 NOTE — Progress Notes (Signed)
 Dr. Alvenia Aus in at bedside to speak with pt. And his wife Burdette Carolin re: cath results. Both verbalized understanding of conversation with MD.

## 2023-07-08 NOTE — Interval H&P Note (Signed)
 Cath Lab Visit (complete for each Cath Lab visit)  Clinical Evaluation Leading to the Procedure:   ACS: No.  Non-ACS:    Anginal Classification: CCS III  Anti-ischemic medical therapy: Minimal Therapy (1 class of medications)  Non-Invasive Test Results: High-risk stress test findings: cardiac mortality >3%/year  Prior CABG: Previous CABG      History and Physical Interval Note:  07/08/2023 7:52 AM  Christopher [name]  has presented today for surgery, with the diagnosis of L Cath w grafts    cad, with angina.  The various methods of treatment have been discussed with the patient and family. After consideration of risks, benefits and other options for treatment, the patient has consented to  Procedure(s): LEFT HEART CATH AND CORS/GRAFTS ANGIOGRAPHY (Left) as a surgical intervention.  The patient's history has been reviewed, patient examined, no change in status, stable for surgery.  I have reviewed the patient's chart and labs.  Questions were answered to the patient's satisfaction.     Christopher [name]

## 2023-07-14 ENCOUNTER — Encounter: Payer: Self-pay | Admitting: Nurse Practitioner

## 2023-07-14 ENCOUNTER — Ambulatory Visit: Payer: Medicare Other | Attending: Nurse Practitioner | Admitting: Nurse Practitioner

## 2023-07-14 VITALS — BP 138/62 | HR 71 | Ht 71.0 in | Wt 203.4 lb

## 2023-07-14 DIAGNOSIS — I480 Paroxysmal atrial fibrillation: Secondary | ICD-10-CM | POA: Diagnosis present

## 2023-07-14 DIAGNOSIS — I2510 Atherosclerotic heart disease of native coronary artery without angina pectoris: Secondary | ICD-10-CM | POA: Diagnosis present

## 2023-07-14 DIAGNOSIS — E785 Hyperlipidemia, unspecified: Secondary | ICD-10-CM | POA: Insufficient documentation

## 2023-07-14 DIAGNOSIS — E119 Type 2 diabetes mellitus without complications: Secondary | ICD-10-CM | POA: Insufficient documentation

## 2023-07-14 DIAGNOSIS — I10 Essential (primary) hypertension: Secondary | ICD-10-CM | POA: Insufficient documentation

## 2023-07-14 DIAGNOSIS — I25118 Atherosclerotic heart disease of native coronary artery with other forms of angina pectoris: Secondary | ICD-10-CM | POA: Diagnosis present

## 2023-07-14 MED ORDER — ISOSORBIDE MONONITRATE ER 30 MG PO TB24: 30.0000 mg | ORAL_TABLET | Freq: Every day | ORAL | 1 refills | Status: DC

## 2023-07-14 NOTE — Progress Notes (Signed)
Office Visit    Patient Name: Christopher [name] Date of Encounter: 07/14/2023  Primary Care Provider:  Marina Goodell, MD Primary Cardiologist:  Julien Nordmann, MD  Chief Complaint    89 y.o. male with a history of CAD status post CABG, carotid arterial disease, hypertension, hyperlipidemia, type 2 diabetes mellitus, GERD, PSVT, frequent PVCs, perioperative atrial fibrillation in 2016, and peripheral vascular disease, presenting for follow-up after recent diagnostic catheterization.  Past Medical History  Subjective   Past Medical History:  Diagnosis Date   Atrial fibrillation (HCC)    a. 08/2014 in setting of NSTEMI - occurred immediately prior to CABG. Managed w/ short-term amio/OAC. No known recurrence.   Carotid artery stenosis    a. 10/2010 Carotid U/S: 1-49% bilat ICA stenoses.   Coronary artery disease    a. 2005 PCI/DES to mLAD & dRCA; b. 09/17/2014 Cath: critical ostial/prox LAD, critical prox LCx, RCA w/ mild diff dz, EF >55%; c. 08/2014 CABG x 3: LIMA->LAD, VG->OM, VG->RPDA; d. 06/2023 Lexi PET/CT: Reduced global myocardial flow reserve/antlat isch; e. 07/2023 Cath: LM 100d, LAD 70d, LCX 100ost/p, OM1 80, OM2 70, RCA 30p/m, 60d, 20d ISR, RPDA 80, VG->RPDA 100 CTO, VG->OM2 ok, LIMA->LAD ok->Med rx.   Diastolic dysfunction    a. 03/2021 Echo: EF 55%, no rwma, GrII DD. Nl RV fxn. Mildly dil LA. Mild MR; b. 11/2021 Echo: EF 55-60%, no rwma, GrI DD, nl RV size/fxn. Mildly dil LA. AoV sclerosis w/o stenosis.   GERD (gastroesophageal reflux disease)    Hyperlipidemia    Hypertension    PSVT (paroxysmal supraventricular tachycardia) (HCC)    a. 07/2020 Zio: Predominantly sinus rhythm. 2 runs NSVT/Atrial Tachycardia.  17 runs SVT, fastest 10 beats @ 150. Longest 12 beats @ 121. Isolated PVCs (20.7% burden). Triggered events = PACs/PVCs.   PVC's (premature ventricular contractions)    a. 07/2020 Zio: [id number] PVCs over 14 days (20.7% burden)->managed w/ low-dose metoprolol.   PVD  (peripheral vascular disease) [facility])    Past Surgical History:  Procedure Laterality Date   CARDIAC CATHETERIZATION     CHOLECYSTECTOMY  05/31/2014   COLONOSCOPY WITH PROPOFOL N/A 12/17/2021   Procedure: COLONOSCOPY WITH PROPOFOL;  Surgeon: Wyline Mood, MD;  Location: [facility] ENDOSCOPY;  Service: Gastroenterology;  Laterality: N/A;   CORONARY ARTERY BYPASS GRAFT N/A 09/20/2014   Procedure: CORONARY ARTERY BYPASS GRAFTING (CABG) x   three using left internal mammary artery and right leg greater saphenous vein harvested endoscopically;  Surgeon: Loreli Slot, MD;  Location: [facility] OR;  Service: Open Heart Surgery;  Laterality: N/A;   LEFT HEART CATH AND CORS/GRAFTS ANGIOGRAPHY Left 07/08/2023   Procedure: LEFT HEART CATH AND CORS/GRAFTS ANGIOGRAPHY;  Surgeon: Iran Ouch, MD;  Location: [facility] INVASIVE CV LAB;  Service: Cardiovascular;  Laterality: Left;   TEE WITHOUT CARDIOVERSION N/A 09/20/2014   Procedure: TRANSESOPHAGEAL ECHOCARDIOGRAM (TEE);  Surgeon: Loreli Slot, MD;  Location: [facility] OR;  Service: Open Heart Surgery;  Laterality: N/A;   TOTAL KNEE ARTHROPLASTY     bilateral    Allergies  No Known Allergies    History of Present Illness      89 y.o. y/o male with a history of carotid artery stenosis, CAD s/p CABG x3 in 2016, DM2, HTN, HLD, GERD, PSVT, freq PVCs, periop Afib (2016), and PVD.  Cardiac hx dates back to 2005, when he underwent PCI/DES to the LAD and RCA.  In 08/2014, he suffered a NSTEMI and was found to have critical ostial LAD and  proximal LCX dzs on cath.  He was planned for CABG and prior to CABG, developed Afib w/ RVR requiring amio.  He underwent successful CABG x 3 and was d/c'd in sinus rhythm.  He was maintained on amio and oral anticoagulation for a short-course following discharge, and both were subsequently d/c'd.  He has no known recurrence of afib, however, in the setting of dizziness in March 2022, he wore a Zio monitor and it showed 17 brief runs of PSVT  as well as a 20.7% PVC burden.  He was placed on metoprolol 12.5mg  BID and an echo was ordered, but pt felt better and didn't carry through w/ echo until 03/2021, which showed an EF of 55% w/ grade II diast dysfxn.   In July 2023, Christopher [name] was admitted with vertiginous symptoms.  MRI of the brain showed minimal chronic small vessel ischemic changes within the cerebral white matter with tiny chronic infarcts within the right cerebellar hemisphere without acute intracranial abnormalities.  He was placed on aspirin and Plavix per neurology.  Echocardiogram showed normal LV function with grade 1 diastolic dysfunction and without significant valvular disease.  Aspirin subsequently discontinued by neurology in the outpatient setting.  Subsequent event monitoring showed predominantly sinus rhythm with 27 brief runs of SVT and occasional PACs and PVCs.  Triggered events were associated with sinus rhythm.  No A-fib or flutter was noted.  In January 2025, Christopher [name] was seen in clinic with complaints of intermittent substernal chest pressure and dyspnea.  Lexiscan PET/CT was performed and showed anterolateral ischemia with globally reduced myocardial flow reserve concerning for multivessel disease.  He underwent diagnostic catheterization in February 2025, which showed patent LIMA to the LAD and vein graft to the second obtuse marginal with chronic total occlusion of the vein graft to the RPDA.  He had severe diffuse native and small vessel disease that were not felt to be amenable to PCI, and medical therapy was recommended.     Since diagnostic catheterization earlier this month, Christopher [name] has mostly done well.  He did have 1 episode of chest discomfort for which he took a sublingual nitroglycerin tablet, after seeing in the choir at church last Sunday.  He has otherwise been able to be active in his yard and around his health without significant limitations.  He denies dyspnea, palpitations, PND, orthopnea, dizziness,  syncope, edema, or early satiety.  We reviewed his Lexiscan PET/CT and diagnostic catheterization in detail today.  All questions answered. Objective  Home Medications    Current Outpatient Medications  Medication Sig Dispense Refill   cholecalciferol (VITAMIN D3) 25 MCG (1000 UNIT) tablet Take 1,000 Units by mouth daily.     clopidogrel (PLAVIX) 75 MG tablet TAKE 1 TABLET BY MOUTH EVERY DAY 90 tablet 1   Cyanocobalamin (VITAMIN B-12) 5000 MCG TBDP Take 5,000 mcg by mouth daily.     dapagliflozin propanediol (FARXIGA) 5 MG TABS tablet Take 5 mg by mouth daily.     gabapentin (NEURONTIN) 100 MG capsule Take 200 mg by mouth 2 (two) times daily.     isosorbide mononitrate (IMDUR) 30 MG 24 hr tablet Take 1 tablet (30 mg total) by mouth daily. 90 tablet 1   lisinopril (ZESTRIL) 2.5 MG tablet Take 2.5 mg by mouth daily.     Magnesium 250 MG TABS Take 250 mg by mouth daily.     metFORMIN (GLUCOPHAGE-XR) 500 MG 24 hr tablet Take 2 tablets (1,000 mg total) by mouth 2 (two) times daily with  a meal. (Patient taking differently: Take 1,000 mg by mouth daily with breakfast.) 60 tablet 3   metoprolol tartrate (LOPRESSOR) 25 MG tablet Take 0.5 tablets (12.5 mg total) by mouth 2 (two) times daily. 90 tablet 1   Multiple Vitamins-Minerals (EYE VITAMINS) CAPS Take 1 capsule by mouth daily. Eye health complex dietary supplement     nitroGLYCERIN (NITROSTAT) 0.4 MG SL tablet Place 1 tablet (0.4 mg total) under the tongue every 5 (five) minutes as needed for chest pain. 25 tablet 3   omeprazole (PRILOSEC) 20 MG capsule Take 20 mg by mouth daily.     rosuvastatin (CRESTOR) 5 MG tablet Take 5 mg by mouth daily.     tetrahydrozoline 0.05 % ophthalmic solution Place 1 drop into both eyes daily. Visine     Baclofen 5 MG TABS Take 1 tablet (5 mg total) by mouth 2 (two) times daily as needed. (Patient not taking: Reported on 06/17/2023) 14 tablet 0   lidocaine (LIDODERM) 5 % Place 1 patch onto the skin daily. Remove &  Discard patch within 12 hours or as directed by MD (Patient not taking: Reported on 07/04/2023) 14 patch 0   No current facility-administered medications for this visit.     Physical Exam    VS:  BP 138/62   Pulse 71   Ht 5\' 11"  (1.803 m)   Wt 203 lb 6 oz (92.3 kg)   SpO2 90%   BMI 28.37 kg/m  , BMI Body mass index is 28.37 kg/m.     Vitals:   07/14/23 0815 07/14/23 0853  BP: (!) 140/70 138/62  Pulse: 71   SpO2: 90%       GEN: Well nourished, well developed, in no acute distress. HEENT: normal. Neck: Supple, no JVD, carotid bruits, or masses. Cardiac: RRR, no murmurs, rubs, or gallops. No clubbing, cyanosis, edema.  Radials 2+/PT 2+ and equal bilaterally.  Respiratory:  Respirations regular and unlabored, clear to auscultation bilaterally. GI: Soft, nontender, nondistended, BS + x 4. MS: no deformity or atrophy. Skin: warm and dry, no rash. Neuro:  Strength and sensation are intact. Psych: Normal affect.  Accessory Clinical Findings    ECG personally reviewed by me today - EKG Interpretation Date/Time:  Thursday July 14 2023 08:20:31 EST Ventricular Rate:  71 PR Interval:  166 QRS Duration:  90 QT Interval:  392 QTC Calculation: 425 R Axis:   52  Text Interpretation: Normal sinus rhythm Nonspecific ST and T wave abnormality Confirmed by Nicolasa Ducking [phone]) on 07/14/2023 8:29:07 AM   - no acute changes.  Labs from Care Everywhere dated July 2024:   Total cholesterol 115, triglycerides 136, HDL 41, LDL 47 Sodium 135, potassium 4.5, chloride 99, CO2 31.1, BUN 17, creatinine 1.1, glucose 189 Total bilirubin 0.8, alkaline phosphatase 57, AST 14, ALT 14 Calcium 9.2, total protein 6.6, albumin 4.5 Hemoglobin A1c 8.3  Lab Results  Component Value Date   WBC 7.3 06/28/2023   HGB 15.2 06/28/2023   HCT 45.3 06/28/2023   MCV 92 06/28/2023   PLT 126 (L) 06/28/2023    Lab Results  Component Value Date   NA 141 06/28/2023   CL 104 06/28/2023   K 4.5  06/28/2023   CO2 21 06/28/2023   BUN 18 06/28/2023   CREATININE 1.03 06/28/2023   EGFR 69 06/28/2023   CALCIUM 9.3 06/28/2023   PHOS 3.2 12/21/2021   ALBUMIN 4.0 12/23/2021   GLUCOSE 252 (H) 06/28/2023       Assessment &  Plan    1.  Stable angina/CAD: Status post CABG in 2016 with recent abnormal Lexiscan PET/CT (anterolateral ischemia), and diagnostic catheterization revealing 2 of 3 patent grafts with a chronic total occlusion of the vein graft to the RPDA, and otherwise small vessel disease with recommendation for medical therapy.  Since his catheterization, he has mostly done well but did have 1 episode of exertional chest discomfort requiring sublingual nitroglycerin with almost immediate relief.  We discussed his testing results in detail today.  I am adding Imdur 30 mg once daily.  He otherwise remains on Plavix, beta-blocker, ACE inhibitor, and statin therapy.  2.  Primary hypertension: Blood pressure initially elevated today but slightly better on repeat at 138/62.  Adding Imdur 30 mg daily.  Continue beta-blocker and ACE inhibitor therapy.  3.  Hyperlipidemia: LDL of 47 with normal LFTs in July 2024.  Continue statin therapy.  4.  Paroxysmal atrial fibrillation: In the setting of non-STEMI and CABG in 2016.  No known recurrence.  He is not on anticoagulation.  Continue beta-blocker.  5.  History of TIA: Prior event monitoring did not show any A-fib or flutter.  No recurrent TIAs.  He is on chronic clopidogrel.  6.  Type 2 diabetes mellitus: A1c 8.3 in July 2024.  He is on metformin, ACE inhibitor, and statin.  Followed by primary care.  7.  Disposition: Follow-up in clinic in 6 weeks or sooner if necessary.  Nicolasa Ducking, NP 07/14/2023, 1:08 PM

## 2023-07-14 NOTE — Patient Instructions (Signed)
Medication Instructions:  START Imdur 30 mg once dailty  *If you need a refill on your cardiac medications before your next appointment, please call your pharmacy*   Lab Work: None ordered If you have labs (blood work) drawn today and your tests are completely normal, you will receive your results only by: MyChart Message (if you have MyChart) OR A paper copy in the mail If you have any lab test that is abnormal or we need to change your treatment, we will call you to review the results.   Testing/Procedures: None ordered   Follow-Up: At [facility], you and your health needs are our priority.  As part of our continuing mission to provide you with exceptional heart care, we have created designated Provider Care Teams.  These Care Teams include your primary Cardiologist (physician) and Advanced Practice Providers (APPs -  Physician Assistants and Nurse Practitioners) who all work together to provide you with the care you need, when you need it.  We recommend signing up for the patient portal called "MyChart".  Sign up information is provided on this After Visit Summary.  MyChart is used to connect with patients for Virtual Visits (Telemedicine).  Patients are able to view lab/test results, encounter notes, upcoming appointments, etc.  Non-urgent messages can be sent to your provider as well.   To learn more about what you can do with MyChart, go to [email or web address].    Your next appointment:   6 week(s)  Provider:   Julien Nordmann, MD or Ward Givens, NP

## 2023-07-21 ENCOUNTER — Other Ambulatory Visit: Payer: Medicare Other

## 2023-08-25 ENCOUNTER — Encounter: Payer: Self-pay | Admitting: Nurse Practitioner

## 2023-08-25 ENCOUNTER — Ambulatory Visit: Payer: Medicare Other | Attending: Nurse Practitioner | Admitting: Nurse Practitioner

## 2023-08-25 VITALS — BP 124/78 | HR 80 | Ht 71.0 in | Wt 201.6 lb

## 2023-08-25 DIAGNOSIS — I2510 Atherosclerotic heart disease of native coronary artery without angina pectoris: Secondary | ICD-10-CM | POA: Diagnosis present

## 2023-08-25 DIAGNOSIS — I480 Paroxysmal atrial fibrillation: Secondary | ICD-10-CM | POA: Diagnosis present

## 2023-08-25 DIAGNOSIS — I10 Essential (primary) hypertension: Secondary | ICD-10-CM | POA: Diagnosis present

## 2023-08-25 DIAGNOSIS — E119 Type 2 diabetes mellitus without complications: Secondary | ICD-10-CM | POA: Diagnosis present

## 2023-08-25 DIAGNOSIS — E785 Hyperlipidemia, unspecified: Secondary | ICD-10-CM | POA: Diagnosis present

## 2023-08-25 NOTE — Progress Notes (Signed)
 Office Visit    Patient Name: Christopher [name] Date of Encounter: 08/25/2023  Primary Care Provider:  Marina Goodell, MD Primary Cardiologist:  Christopher Nordmann, MD  Chief Complaint    89 y.o. male with a history of CAD status post CABG, carotid arterial disease, hypertension, hyperlipidemia, type 2 diabetes mellitus, GERD, PSVT, frequent PVCs, perioperative atrial fibrillation in 2016, and peripheral vascular disease, who presents for follow-up related to CAD.  Past Medical History  Subjective   Past Medical History:  Diagnosis Date   Atrial fibrillation (HCC)    a. 08/2014 in setting of NSTEMI - occurred immediately prior to CABG. Managed w/ short-term amio/OAC. No known recurrence.   Carotid artery stenosis    a. 10/2010 Carotid U/S: 1-49% bilat ICA stenoses.   Coronary artery disease    a. 2005 PCI/DES to mLAD & dRCA; b. 09/17/2014 Cath: critical ostial/prox LAD, critical prox LCx, RCA w/ mild diff dz, EF >55%; c. 08/2014 CABG x 3: LIMA->LAD, VG->OM, VG->RPDA; d. 06/2023 Lexi PET/CT: Reduced global myocardial flow reserve/antlat isch; e. 07/2023 Cath: LM 100d, LAD 70d, LCX 100ost/p, OM1 80, OM2 70, RCA 30p/m, 60d, 20d ISR, RPDA 80, VG->RPDA 100 CTO, VG->OM2 ok, LIMA->LAD ok->Med rx.   Diastolic dysfunction    a. 03/2021 Echo: EF 55%, no rwma, GrII DD. Nl RV fxn. Mildly dil LA. Mild MR; b. 11/2021 Echo: EF 55-60%, no rwma, GrI DD, nl RV size/fxn. Mildly dil LA. AoV sclerosis w/o stenosis.   GERD (gastroesophageal reflux disease)    Hyperlipidemia    Hypertension    PSVT (paroxysmal supraventricular tachycardia) (HCC)    a. 07/2020 Zio: Predominantly sinus rhythm. 2 runs NSVT/Atrial Tachycardia.  17 runs SVT, fastest 10 beats @ 150. Longest 12 beats @ 121. Isolated PVCs (20.7% burden). Triggered events = PACs/PVCs.   PVC's (premature ventricular contractions)    a. 07/2020 Zio: [id number] PVCs over 14 days (20.7% burden)->managed w/ low-dose metoprolol.   PVD (peripheral vascular disease)  [facility])    Past Surgical History:  Procedure Laterality Date   CARDIAC CATHETERIZATION     CHOLECYSTECTOMY  05/31/2014   COLONOSCOPY WITH PROPOFOL N/A 12/17/2021   Procedure: COLONOSCOPY WITH PROPOFOL;  Surgeon: Christopher Mood, MD;  Location: [facility] ENDOSCOPY;  Service: Gastroenterology;  Laterality: N/A;   CORONARY ARTERY BYPASS GRAFT N/A 09/20/2014   Procedure: CORONARY ARTERY BYPASS GRAFTING (CABG) x   three using left internal mammary artery and right leg greater saphenous vein harvested endoscopically;  Surgeon: Christopher Slot, MD;  Location: [facility] OR;  Service: Open Heart Surgery;  Laterality: N/A;   LEFT HEART CATH AND CORS/GRAFTS ANGIOGRAPHY Left 07/08/2023   Procedure: LEFT HEART CATH AND CORS/GRAFTS ANGIOGRAPHY;  Surgeon: Christopher Ouch, MD;  Location: [facility] INVASIVE CV LAB;  Service: Cardiovascular;  Laterality: Left;   TEE WITHOUT CARDIOVERSION N/A 09/20/2014   Procedure: TRANSESOPHAGEAL ECHOCARDIOGRAM (TEE);  Surgeon: Christopher Slot, MD;  Location: [facility] OR;  Service: Open Heart Surgery;  Laterality: N/A;   TOTAL KNEE ARTHROPLASTY     bilateral    Allergies  Allergies  Allergen Reactions   Imdur [Isosorbide Nitrate]     fatigue      History of Present Illness      89 y.o. y/o male with a history of carotid artery stenosis, CAD s/p CABG x3 in 2016, DM2, HTN, HLD, GERD, PSVT, freq PVCs, periop Afib (2016), and PVD.  Cardiac hx dates back to 2005, when he underwent PCI/DES to the LAD and RCA.  In 08/2014,  he suffered a NSTEMI and was found to have critical ostial LAD and proximal LCX dzs on cath.  He was planned for CABG and prior to CABG, developed Afib w/ RVR requiring amio.  He underwent successful CABG x 3 and was d/c'd in sinus rhythm.  He was maintained on amio and oral anticoagulation for a short-course following discharge, and both were subsequently d/c'd.  He has no known recurrence of afib, however, in the setting of dizziness in March 2022, he wore a Zio monitor and it  showed 17 brief runs of PSVT as well as a 20.7% PVC burden.  He was placed on metoprolol 12.5mg  BID and an echo was ordered, but pt felt better and didn't carry through w/ echo until 03/2021, which showed an EF of 55% w/ grade II diast dysfxn.   In July 2023, [name] was admitted with vertiginous symptoms.  MRI of the brain showed minimal chronic small vessel ischemic changes within the cerebral white matter with tiny chronic infarcts within the right cerebellar hemisphere without acute intracranial abnormalities.  He was placed on aspirin and Plavix per neurology.  Echocardiogram showed normal LV function with grade 1 diastolic dysfunction and without significant valvular disease.  Aspirin subsequently discontinued by neurology in the outpatient setting.  Subsequent event monitoring showed predominantly sinus rhythm with 27 brief runs of SVT and occasional PACs and PVCs.  Triggered events were associated with sinus rhythm.  No A-fib or flutter was noted.  In January 2025, [name] was seen in clinic with complaints of intermittent substernal chest pressure and dyspnea.  Lexiscan PET/CT was performed and showed anterolateral ischemia with globally reduced myocardial flow reserve concerning for multivessel disease.  He underwent diagnostic catheterization in February 2025, which showed patent LIMA to the LAD and vein graft to the second obtuse marginal with chronic total occlusion of the vein graft to the RPDA.  He had severe diffuse native and small vessel disease that were not felt to be amenable to PCI, and medical therapy was recommended.     [name] was last seen in cardiology clinic in February 2025 at which time he was mostly doing well having only had to take 1 sublingual nitroglycerin since his prior catheterization.  Isosorbide mononitrate 30 mg daily was added.  Unfortunately, he felt very fatigued while on isosorbide mononitrate and this was discontinued.  Since then, he has felt well.  He has  been active in his yard on several occasions and did not experience any chest pain or dyspnea.  He says that he fatigues a little more easily but only after 45 minutes to an hour of working, and at that point he will just rest.  He is overall pleased with the way that he is feels and is not interested in trying an alternate medication like ranolazine at this time.  He denies palpitations, PND, orthopnea, dizziness, syncope, edema, or early satiety. Objective  Home Medications    Current Outpatient Medications  Medication Sig Dispense Refill   Baclofen 5 MG TABS Take 1 tablet (5 mg total) by mouth 2 (two) times daily as needed. 14 tablet 0   cholecalciferol (VITAMIN D3) 25 MCG (1000 UNIT) tablet Take 1,000 Units by mouth daily.     clopidogrel (PLAVIX) 75 MG tablet TAKE 1 TABLET BY MOUTH EVERY DAY 90 tablet 1   Cyanocobalamin (VITAMIN B-12) 5000 MCG TBDP Take 5,000 mcg by mouth daily.     dapagliflozin propanediol (FARXIGA) 5 MG TABS tablet Take 5 mg  by mouth daily.     gabapentin (NEURONTIN) 100 MG capsule Take 200 mg by mouth 2 (two) times daily.     lidocaine (LIDODERM) 5 % Place 1 patch onto the skin daily. Remove & Discard patch within 12 hours or as directed by MD 14 patch 0   lisinopril (ZESTRIL) 2.5 MG tablet Take 2.5 mg by mouth daily.     Magnesium 250 MG TABS Take 250 mg by mouth daily.     metFORMIN (GLUCOPHAGE-XR) 500 MG 24 hr tablet Take 2 tablets (1,000 mg total) by mouth 2 (two) times daily with a meal. (Patient taking differently: Take 1,000 mg by mouth daily with breakfast.) 60 tablet 3   metoprolol tartrate (LOPRESSOR) 25 MG tablet Take 0.5 tablets (12.5 mg total) by mouth 2 (two) times daily. 90 tablet 1   Multiple Vitamins-Minerals (EYE VITAMINS) CAPS Take 1 capsule by mouth daily. Eye health complex dietary supplement     nitroGLYCERIN (NITROSTAT) 0.4 MG SL tablet Place 1 tablet (0.4 mg total) under the tongue every 5 (five) minutes as needed for chest pain. 25 tablet 3    omeprazole (PRILOSEC) 20 MG capsule Take 20 mg by mouth daily.     rosuvastatin (CRESTOR) 5 MG tablet Take 5 mg by mouth daily.     tetrahydrozoline 0.05 % ophthalmic solution Place 1 drop into both eyes daily. Visine     No current facility-administered medications for this visit.     Physical Exam    VS:  BP 124/78   Pulse 80   Ht 5\' 11"  (1.803 m)   Wt 201 lb 9.6 oz (91.4 kg)   SpO2 97%   BMI 28.12 kg/m  , BMI Body mass index is 28.12 kg/m.       GEN: Well nourished, well developed, in no acute distress. HEENT: normal. Neck: Supple, no JVD, carotid bruits, or masses. Cardiac: RRR, no murmurs, rubs, or gallops. No clubbing, cyanosis, edema.  Radials 2+/PT 2+ and equal bilaterally.  Respiratory:  Respirations regular and unlabored, clear to auscultation bilaterally. GI: Soft, nontender, nondistended, BS + x 4. MS: no deformity or atrophy. Skin: warm and dry, no rash. Neuro:  Strength and sensation are intact. Psych: Normal affect.  Accessory Clinical Findings    Labs dated July 15, 2023 from Care Everywhere  Sodium 138, potassium 4.5, chloride 102, CO2 24.6, BUN 22, creatinine 1.1, glucose 158 Calcium 9.3, albumin 4.7, total protein 6.9 Total bili 0.9, alkaline phosphatase 61, AST 13, ALT 12 Total cholesterol 120, triglycerides 146, HDL 39.6, LDL 51 Hemoglobin A1c 7.8      Assessment & Plan    1.  Stable angina/CAD: Status post CABG in 2016 with abnormal Lexiscan PET/CT (anterolateral ischemia) earlier this year followed by diagnostic catheterization revealing 2 of 3 patent grafts with chronic total occlusion of the vein graft to the RPDA, and otherwise small vessel disease with recommendation for medical therapy.  He did not tolerate addition of isosorbide mononitrate in the setting of fatigue.  He has been doing well since this was discontinued without any chest pain or dyspnea.  He is overall pleased with the way that he is currently feeling and would like to  continue his current medications-Plavix, beta-blocker, ACE inhibitor, and statin therapy.  2.  Primary hypertension: Blood pressure stable on beta-blocker and ACE inhibitor therapy.  3.  Hyperlipidemia: LDL 47 with normal LFTs in July 2024.  Continue statin therapy.  4.  Paroxysmal atrial fibrillation: This occurred in the setting of  non-STEMI and CABG in 2016.  No known recurrence.  He is not on anticoagulation.  Continue beta-blocker.  5.  History of TIA: Prior event monitoring did not show any A-fib or flutter.  No recurrent TIAs.  He remains on chronic clopidogrel.  6.  Type 2 diabetes mellitus: A1c 7.8 in February of this year, down from 8.3 last July.  He is followed by primary care and remains on metformin, ACE inhibitor, and statin.  7.  Disposition: Follow-up in 3 months or sooner if necessary.  Nicolasa Ducking, NP 08/25/2023, 9:06 AM

## 2023-08-25 NOTE — Patient Instructions (Signed)
 Medication Instructions:  No changes *If you need a refill on your cardiac medications before your next appointment, please call your pharmacy*   Lab Work: None ordered If you have labs (blood work) drawn today and your tests are completely normal, you will receive your results only by: MyChart Message (if you have MyChart) OR A paper copy in the mail If you have any lab test that is abnormal or we need to change your treatment, we will call you to review the results.   Testing/Procedures: None ordered   Follow-Up: At [facility], you and your health needs are our priority.  As part of our continuing mission to provide you with exceptional heart care, we have created designated Provider Care Teams.  These Care Teams include your primary Cardiologist (physician) and Advanced Practice Providers (APPs -  Physician Assistants and Nurse Practitioners) who all work together to provide you with the care you need, when you need it.  We recommend signing up for the patient portal called "MyChart".  Sign up information is provided on this After Visit Summary.  MyChart is used to connect with patients for Virtual Visits (Telemedicine).  Patients are able to view lab/test results, encounter notes, upcoming appointments, etc.  Non-urgent messages can be sent to your provider as well.   To learn more about what you can do with MyChart, go to [email or web address].    Your next appointment:   3 month(s)  Provider:   You may see Julien Nordmann, MD or one of the following Advanced Practice Providers on your designated Care Team:   Nicolasa Ducking, NP

## 2023-11-24 ENCOUNTER — Encounter: Payer: Self-pay | Admitting: Podiatry

## 2023-11-24 ENCOUNTER — Ambulatory Visit (INDEPENDENT_AMBULATORY_CARE_PROVIDER_SITE_OTHER): Admitting: Podiatry

## 2023-11-24 ENCOUNTER — Ambulatory Visit

## 2023-11-24 ENCOUNTER — Other Ambulatory Visit: Payer: Self-pay | Admitting: Podiatry

## 2023-11-24 DIAGNOSIS — M19071 Primary osteoarthritis, right ankle and foot: Secondary | ICD-10-CM | POA: Diagnosis not present

## 2023-11-24 DIAGNOSIS — G5791 Unspecified mononeuropathy of right lower limb: Secondary | ICD-10-CM

## 2023-11-24 DIAGNOSIS — M722 Plantar fascial fibromatosis: Secondary | ICD-10-CM

## 2023-11-24 MED ORDER — TRIAMCINOLONE ACETONIDE 40 MG/ML IJ SUSP
20.0000 mg | Freq: Once | INTRAMUSCULAR | Status: AC
  Administered 2023-11-24: 20 mg

## 2023-11-25 ENCOUNTER — Ambulatory Visit: Attending: Nurse Practitioner | Admitting: Nurse Practitioner

## 2023-11-25 ENCOUNTER — Encounter: Payer: Self-pay | Admitting: Nurse Practitioner

## 2023-11-25 VITALS — BP 120/56 | HR 88 | Ht 71.0 in | Wt 200.0 lb

## 2023-11-25 DIAGNOSIS — E785 Hyperlipidemia, unspecified: Secondary | ICD-10-CM | POA: Diagnosis present

## 2023-11-25 DIAGNOSIS — E119 Type 2 diabetes mellitus without complications: Secondary | ICD-10-CM | POA: Insufficient documentation

## 2023-11-25 DIAGNOSIS — R42 Dizziness and giddiness: Secondary | ICD-10-CM | POA: Insufficient documentation

## 2023-11-25 DIAGNOSIS — I2510 Atherosclerotic heart disease of native coronary artery without angina pectoris: Secondary | ICD-10-CM | POA: Insufficient documentation

## 2023-11-25 DIAGNOSIS — I480 Paroxysmal atrial fibrillation: Secondary | ICD-10-CM | POA: Insufficient documentation

## 2023-11-25 DIAGNOSIS — I10 Essential (primary) hypertension: Secondary | ICD-10-CM | POA: Diagnosis present

## 2023-11-25 MED ORDER — METOPROLOL SUCCINATE ER 25 MG PO TB24: 12.5000 mg | ORAL_TABLET | Freq: Every day | ORAL | 1 refills | Status: DC

## 2023-11-25 NOTE — Patient Instructions (Signed)
 Medication Instructions:  Your physician recommends the following medication changes.  STOP TAKING: Metoprolol  Tartrate  START TAKING: Metoprolol  Succinate (Toprol  XL)-12.5 mg at bedtime (half of the 25 mg tablet)   *If you need a refill on your cardiac medications before your next appointment, please call your pharmacy*  Lab Work: None ordered If you have labs (blood work) drawn today and your tests are completely normal, you will receive your results only by: MyChart Message (if you have MyChart) OR A paper copy in the mail If you have any lab test that is abnormal or we need to change your treatment, we will call you to review the results.  Testing/Procedures: None ordered  Follow-Up: At [facility], you and your health needs are our priority.  As part of our continuing mission to provide you with exceptional heart care, our providers are all part of one team.  This team includes your primary Cardiologist (physician) and Advanced Practice Providers or APPs (Physician Assistants and Nurse Practitioners) who all work together to provide you with the care you need, when you need it.  Your next appointment:   6 month(s)  Provider:   You may see Timothy Gollan, MD or one of the following Advanced Practice Providers on your designated Care Team:   Lonni Meager, NP  We recommend signing up for the patient portal called MyChart.  Sign up information is provided on this After Visit Summary.  MyChart is used to connect with patients for Virtual Visits (Telemedicine).  Patients are able to view lab/test results, encounter notes, upcoming appointments, etc.  Non-urgent messages can be sent to your provider as well.   To learn more about what you can do with MyChart, go to [email or web address].

## 2023-11-25 NOTE — Progress Notes (Signed)
 Office Visit    Patient Name: Christopher [name] Date of Encounter: 11/25/2023  Primary Care Provider:  Jeffie Cheryl BRAVO, MD Primary Cardiologist:  Christopher Lunger, MD  Chief Complaint    89 y.o. male with a history of CAD status post CABG, carotid arterial disease, hypertension, hyperlipidemia, type 2 diabetes mellitus, GERD, PSVT, frequent PVCs, perioperative atrial fibrillation in 2016, and peripheral vascular disease, who presents for follow-up related to CAD.   Past Medical History   Subjective   Past Medical History:  Diagnosis Date   Atrial fibrillation (HCC)    a. 08/2014 in setting of NSTEMI - occurred immediately prior to CABG. Managed w/ short-term amio/OAC. No known recurrence.   Carotid artery stenosis    a. 10/2010 Carotid U/S: 1-49% bilat ICA stenoses.   Coronary artery disease    a. 2005 PCI/DES to mLAD & dRCA; b. 09/17/2014 Cath: critical ostial/prox LAD, critical prox LCx, RCA w/ mild diff dz, EF >55%; c. 08/2014 CABG x 3: LIMA->LAD, VG->OM, VG->RPDA; d. 06/2023 Lexi PET/CT: Reduced global myocardial flow reserve/antlat isch; e. 07/2023 Cath: LM 100d, LAD 70d, LCX 100ost/p, OM1 80, OM2 70, RCA 30p/m, 60d, 20d ISR, RPDA 80, VG->RPDA 100 CTO, VG->OM2 ok, LIMA->LAD ok->Med rx.   Diastolic dysfunction    a. 03/2021 Echo: EF 55%, no rwma, GrII DD. Nl RV fxn. Mildly dil LA. Mild MR; b. 11/2021 Echo: EF 55-60%, no rwma, GrI DD, nl RV size/fxn. Mildly dil LA. AoV sclerosis w/o stenosis.   GERD (gastroesophageal reflux disease)    Hyperlipidemia    Hypertension    PSVT (paroxysmal supraventricular tachycardia) (HCC)    a. 07/2020 Zio: Predominantly sinus rhythm. 2 runs NSVT/Atrial Tachycardia.  17 runs SVT, fastest 10 beats @ 150. Longest 12 beats @ 121. Isolated PVCs (20.7% burden). Triggered events = PACs/PVCs.   PVC's (premature ventricular contractions)    a. 07/2020 Zio: [id number] PVCs over 14 days (20.7% burden)->managed w/ low-dose metoprolol .   PVD (peripheral vascular disease)  (HCC)    Past Surgical History:  Procedure Laterality Date   CARDIAC CATHETERIZATION     CHOLECYSTECTOMY  05/31/2014   COLONOSCOPY WITH PROPOFOL  N/A 12/17/2021   Procedure: COLONOSCOPY WITH PROPOFOL ;  Surgeon: Christopher Bi, MD;  Location: [facility] ENDOSCOPY;  Service: Gastroenterology;  Laterality: N/A;   CORONARY ARTERY BYPASS GRAFT N/A 09/20/2014   Procedure: CORONARY ARTERY BYPASS GRAFTING (CABG) x   three using left internal mammary artery and right leg greater saphenous vein harvested endoscopically;  Surgeon: Christopher JAYSON Millers, MD;  Location: [facility] OR;  Service: Open Heart Surgery;  Laterality: N/A;   LEFT HEART CATH AND CORS/GRAFTS ANGIOGRAPHY Left 07/08/2023   Procedure: LEFT HEART CATH AND CORS/GRAFTS ANGIOGRAPHY;  Surgeon: Christopher Deatrice LABOR, MD;  Location: [facility] INVASIVE CV LAB;  Service: Cardiovascular;  Laterality: Left;   TEE WITHOUT CARDIOVERSION N/A 09/20/2014   Procedure: TRANSESOPHAGEAL ECHOCARDIOGRAM (TEE);  Surgeon: Christopher JAYSON Millers, MD;  Location: [facility] OR;  Service: Open Heart Surgery;  Laterality: N/A;   TOTAL KNEE ARTHROPLASTY     bilateral    Allergies  Allergies  Allergen Reactions   Imdur  [Isosorbide  Nitrate]     fatigue       History of Present Illness      89 y.o. y/o male with a history of carotid artery stenosis, CAD s/p CABG x3 in 2016, DM2, HTN, HLD, GERD, PSVT, freq PVCs, periop Afib (2016), and PVD.  Cardiac hx dates back to 2005, when he underwent PCI/DES to the LAD and RCA.  In 08/2014, he suffered a NSTEMI and was found to have critical ostial LAD and proximal LCX dzs on cath.  He was planned for CABG and prior to CABG, developed Afib w/ RVR requiring amio.  He underwent successful CABG x 3 and was d/c'd in sinus rhythm.  He was maintained on amio and oral anticoagulation for a short-course following discharge, and both were subsequently d/c'd.  He has no known recurrence of afib, however, in the setting of dizziness in March 2022, he wore a Zio monitor and  it showed 17 brief runs of PSVT as well as a 20.7% PVC burden.  He was placed on metoprolol  12.5mg  BID and an echo was ordered, but pt felt better and didn't carry through w/ echo until 03/2021, which showed an EF of 55% w/ grade II diast dysfxn.   In July 2023, [name] was admitted with vertiginous symptoms.  MRI of the brain showed minimal chronic small vessel ischemic changes within the cerebral white matter with tiny chronic infarcts within the right cerebellar hemisphere without acute intracranial abnormalities.  He was placed on aspirin  and Plavix  per neurology.  Echocardiogram showed normal LV function with grade 1 diastolic dysfunction and without significant valvular disease.  Aspirin  subsequently discontinued by neurology in the outpatient setting.  Subsequent event monitoring showed predominantly sinus rhythm with 27 brief runs of SVT and occasional PACs and PVCs.  Triggered events were associated with sinus rhythm.  No A-fib or flutter was noted.  In January 2025, [name] was seen in clinic with complaints of intermittent substernal chest pressure and dyspnea.  Lexiscan  PET/CT was performed and showed anterolateral ischemia with globally reduced myocardial flow reserve concerning for multivessel disease.  He underwent diagnostic catheterization in February 2025, which showed patent LIMA to the LAD and vein graft to the second obtuse marginal with chronic total occlusion of the vein graft to the RPDA.  He had severe diffuse native and small vessel disease that were not felt to be amenable to PCI, and medical therapy was recommended.  He was subsequently placed on long-acting nitrate therapy but developed fatigue and self discontinued.       [name] was last seen in cardiology clinic in March 2025, at which time he was doing well without recurrent chest pain or dyspnea.  He has continued to remain active without chest pain or dyspnea but over the past few months, he has noted intermittent  lightheaded episodes occurring randomly, lasting several hours, resolving spontaneously.  He does check his blood pressure and heart rate during these episodes and he is typically hypertensive in the 150s with heart rates in the 60s to 70s.  Symptoms resolved spontaneously.  He reduced his metoprolol  to tartrate to just 12.5 mg at night and stop taking the morning dose.  In that setting, symptoms are currently resolved.  He denies palpitations, PND, orthopnea, syncope, edema, or early satiety. Objective   Home Medications    Current Outpatient Medications  Medication Sig Dispense Refill   Baclofen  5 MG TABS Take 1 tablet (5 mg total) by mouth 2 (two) times daily as needed. 14 tablet 0   cholecalciferol (VITAMIN D3) 25 MCG (1000 UNIT) tablet Take 1,000 Units by mouth daily.     clopidogrel  (PLAVIX ) 75 MG tablet TAKE 1 TABLET BY MOUTH EVERY DAY 90 tablet 1   Cyanocobalamin (VITAMIN B-12) 5000 MCG TBDP Take 5,000 mcg by mouth daily.     dapagliflozin propanediol (FARXIGA) 5 MG TABS tablet Take 5 mg  by mouth daily.     gabapentin  (NEURONTIN ) 100 MG capsule Take 200 mg by mouth 2 (two) times daily.     lisinopril (ZESTRIL) 2.5 MG tablet Take 2.5 mg by mouth daily.     Magnesium  250 MG TABS Take 250 mg by mouth daily.     metFORMIN  (GLUCOPHAGE -XR) 500 MG 24 hr tablet Take 2 tablets (1,000 mg total) by mouth 2 (two) times daily with a meal. (Patient taking differently: Take 1,000 mg by mouth daily with breakfast.) 60 tablet 3   metoprolol  succinate (TOPROL -XL) 25 MG 24 hr tablet Take 0.5 tablets (12.5 mg total) by mouth at bedtime. Take with or immediately following a meal. 45 tablet 1   Multiple Vitamins-Minerals (EYE VITAMINS) CAPS Take 1 capsule by mouth daily. Eye health complex dietary supplement     nitroGLYCERIN  (NITROSTAT ) 0.4 MG SL tablet Place 1 tablet (0.4 mg total) under the tongue every 5 (five) minutes as needed for chest pain. 25 tablet 3   omeprazole  (PRILOSEC) 20 MG capsule Take 20 mg by  mouth daily.     rosuvastatin  (CRESTOR ) 5 MG tablet Take 5 mg by mouth daily.     tetrahydrozoline 0.05 % ophthalmic solution Place 1 drop into both eyes daily. Visine     lidocaine  (LIDODERM ) 5 % Place 1 patch onto the skin daily. Remove & Discard patch within 12 hours or as directed by MD (Patient not taking: Reported on 11/25/2023) 14 patch 0   No current facility-administered medications for this visit.     Physical Exam    VS:  BP (!) 120/56   Pulse 88   Ht 5' 11 (1.803 m)   Wt 200 lb (90.7 kg)   SpO2 97%   BMI 27.89 kg/m  , BMI Body mass index is 27.89 kg/m.          GEN: Well nourished, well developed, in no acute distress. HEENT: normal. Neck: Supple, no JVD, carotid bruits, or masses. Cardiac: RRR, no murmurs, rubs, or gallops. No clubbing, cyanosis, edema.  Radials 2+/PT 2+ and equal bilaterally.  Respiratory:  Respirations regular and unlabored, clear to auscultation bilaterally. GI: Soft, nontender, nondistended, BS + x 4. MS: no deformity or atrophy. Skin: warm and dry, no rash. Neuro:  Strength and sensation are intact. Psych: Normal affect.  Accessory Clinical Findings    ECG personally reviewed by me today - EKG Interpretation Date/Time:  Friday November 25 2023 08:43:37 EDT Ventricular Rate:  76 PR Interval:  174 QRS Duration:  92 QT Interval:  374 QTC Calculation: 420 R Axis:   51  Text Interpretation: Normal sinus rhythm with sinus arrhythmia Nonspecific T wave abnormality Confirmed by Vivienne Bruckner [phone]) on 11/25/2023 12:52:56 PM  - no acute changes.  Labs dated July 15, 2023 from Care Everywhere   Sodium 138, potassium 4.5, chloride 102, CO2 24.6, BUN 22, creatinine 1.1, glucose 158 Calcium  9.3, albumin  4.7, total protein 6.9 Total bili 0.9, alkaline phosphatase 61, AST 13, ALT 12 Total cholesterol 120, triglycerides 146, HDL 39.6, LDL 51 Hemoglobin A1c 7.8      Assessment & Plan    1.  Coronary artery disease: Status post CABG in  2016 with abnormal PET stress test earlier this year followed by diagnostic catheterization revealing 2 of 3 patent grafts with chronic total occlusion of the vein graft to the RPDA, and otherwise small vessel disease with recommendation for medical therapy.  He did not tolerate addition of Imdur  in the setting of fatigue but  over the past several months, has done well without chest pain or dyspnea.  He remains on Plavix , beta-blocker, ACE inhibitor, and statin therapy.  2.  Primary hypertension: Blood pressure stable on beta-blocker and ACE inhibitor.  Due to episodes of lightheadedness, he is reduced his metoprolol  to just 12.5 mg nightly.  As he is currently on short acting metoprolol , I am switching this to metoprolol  succinate 12.5 mg nightly.  3.  Hyperlipidemia: LDL 47 with normal LFTs in July 2024.  He remains on statin therapy.  4.  Paroxysmal atrial fibrillation: This occurred in the setting of non-STEMI and CABG in 2016 without known recurrence.  He is not on anticoagulation.  He remains on beta-blocker therapy-switching to Toprol -XL 12.5 mg nightly as outlined above.  5.  History of TIA: Prior event monitor did not show any A-fib or flutter.  No recurrent TIAs.  He remains on chronic clopidogrel .  6.  Type 2 diabetes mellitus: A1c 7.8 in February of this year, down from 8.3 last July.  Is followed by primary care and remains on metformin , ACE inhibitor, and statin.  7.  Lightheadedness: Over the past several months, he has been experiencing random lightheaded spells occurring several times per week, lasting hours at a time, resolving spontaneously.  Blood pressures and heart rates are normal during these events.  ECG is unremarkable today.  Things of gotten better over the past week by reducing his metoprolol  tartrate to 12.5 mg at night only, no longer taking the a.m. dose.  As above, discontinuing tartrate in favor of metoprolol  succinate 12.5 mg nightly.  If he continues to have symptoms,  would have a low threshold to place a ZIO monitor.  8.  Disposition: Follow-up in clinic in 6 months or sooner if necessary.  Lonni Meager, NP 11/25/2023, 12:55 PM

## 2023-11-25 NOTE — Progress Notes (Signed)
 Subjective:  Patient ID: Christopher [name], male    DOB: Oct 17, 1931,  MRN: [id number] HPI Chief Complaint  Patient presents with   Toe Pain    Hallux right - diagnosed with nerve damage in both feet, taking gabapentin  200mg  BID, now recently started having shooting pains through big toe   New Patient (Initial Visit)    Est pt 01/2020    89 y.o. male presents with the above complaint.   ROS: Denies fever chills nausea muscle aches pains calf pain back pain chest pain shortness of breath.  Past Medical History:  Diagnosis Date   Atrial fibrillation (HCC)    a. 08/2014 in setting of NSTEMI - occurred immediately prior to CABG. Managed w/ short-term amio/OAC. No known recurrence.   Carotid artery stenosis    a. 10/2010 Carotid U/S: 1-49% bilat ICA stenoses.   Coronary artery disease    a. 2005 PCI/DES to mLAD & dRCA; b. 09/17/2014 Cath: critical ostial/prox LAD, critical prox LCx, RCA w/ mild diff dz, EF >55%; c. 08/2014 CABG x 3: LIMA->LAD, VG->OM, VG->RPDA; d. 06/2023 Lexi PET/CT: Reduced global myocardial flow reserve/antlat isch; e. 07/2023 Cath: LM 100d, LAD 70d, LCX 100ost/p, OM1 80, OM2 70, RCA 30p/m, 60d, 20d ISR, RPDA 80, VG->RPDA 100 CTO, VG->OM2 ok, LIMA->LAD ok->Med rx.   Diastolic dysfunction    a. 03/2021 Echo: EF 55%, no rwma, GrII DD. Nl RV fxn. Mildly dil LA. Mild MR; b. 11/2021 Echo: EF 55-60%, no rwma, GrI DD, nl RV size/fxn. Mildly dil LA. AoV sclerosis w/o stenosis.   GERD (gastroesophageal reflux disease)    Hyperlipidemia    Hypertension    PSVT (paroxysmal supraventricular tachycardia) (HCC)    a. 07/2020 Zio: Predominantly sinus rhythm. 2 runs NSVT/Atrial Tachycardia.  17 runs SVT, fastest 10 beats @ 150. Longest 12 beats @ 121. Isolated PVCs (20.7% burden). Triggered events = PACs/PVCs.   PVC's (premature ventricular contractions)    a. 07/2020 Zio: 735083 PVCs over 14 days (20.7% burden)->managed w/ low-dose metoprolol .   PVD (peripheral vascular disease) (HCC)    Past  Surgical History:  Procedure Laterality Date   CARDIAC CATHETERIZATION     CHOLECYSTECTOMY  05/31/2014   COLONOSCOPY WITH PROPOFOL  N/A 12/17/2021   Procedure: COLONOSCOPY WITH PROPOFOL ;  Surgeon: Therisa Bi, MD;  Location: [facility] ENDOSCOPY;  Service: Gastroenterology;  Laterality: N/A;   CORONARY ARTERY BYPASS GRAFT N/A 09/20/2014   Procedure: CORONARY ARTERY BYPASS GRAFTING (CABG) x   three using left internal mammary artery and right leg greater saphenous vein harvested endoscopically;  Surgeon: Elspeth JAYSON Millers, MD;  Location: [facility] OR;  Service: Open Heart Surgery;  Laterality: N/A;   LEFT HEART CATH AND CORS/GRAFTS ANGIOGRAPHY Left 07/08/2023   Procedure: LEFT HEART CATH AND CORS/GRAFTS ANGIOGRAPHY;  Surgeon: Darron Deatrice LABOR, MD;  Location: [facility] INVASIVE CV LAB;  Service: Cardiovascular;  Laterality: Left;   TEE WITHOUT CARDIOVERSION N/A 09/20/2014   Procedure: TRANSESOPHAGEAL ECHOCARDIOGRAM (TEE);  Surgeon: Elspeth JAYSON Millers, MD;  Location: [facility] OR;  Service: Open Heart Surgery;  Laterality: N/A;   TOTAL KNEE ARTHROPLASTY     bilateral    Current Outpatient Medications:    Baclofen  5 MG TABS, Take 1 tablet (5 mg total) by mouth 2 (two) times daily as needed., Disp: 14 tablet, Rfl: 0   cholecalciferol (VITAMIN D3) 25 MCG (1000 UNIT) tablet, Take 1,000 Units by mouth daily., Disp: , Rfl:    clopidogrel  (PLAVIX ) 75 MG tablet, TAKE 1 TABLET BY MOUTH EVERY DAY, Disp: 90 tablet, Rfl:  1   Cyanocobalamin (VITAMIN B-12) 5000 MCG TBDP, Take 5,000 mcg by mouth daily., Disp: , Rfl:    dapagliflozin propanediol (FARXIGA) 5 MG TABS tablet, Take 5 mg by mouth daily., Disp: , Rfl:    gabapentin  (NEURONTIN ) 100 MG capsule, Take 200 mg by mouth 2 (two) times daily., Disp: , Rfl:    lidocaine  (LIDODERM ) 5 %, Place 1 patch onto the skin daily. Remove & Discard patch within 12 hours or as directed by MD (Patient not taking: Reported on 11/25/2023), Disp: 14 patch, Rfl: 0   lisinopril (ZESTRIL) 2.5 MG  tablet, Take 2.5 mg by mouth daily., Disp: , Rfl:    Magnesium  250 MG TABS, Take 250 mg by mouth daily., Disp: , Rfl:    metFORMIN  (GLUCOPHAGE -XR) 500 MG 24 hr tablet, Take 2 tablets (1,000 mg total) by mouth 2 (two) times daily with a meal. (Patient taking differently: Take 1,000 mg by mouth daily with breakfast.), Disp: 60 tablet, Rfl: 3   metoprolol  succinate (TOPROL -XL) 25 MG 24 hr tablet, Take 0.5 tablets (12.5 mg total) by mouth at bedtime. Take with or immediately following a meal., Disp: 45 tablet, Rfl: 1   Multiple Vitamins-Minerals (EYE VITAMINS) CAPS, Take 1 capsule by mouth daily. Eye health complex dietary supplement, Disp: , Rfl:    nitroGLYCERIN  (NITROSTAT ) 0.4 MG SL tablet, Place 1 tablet (0.4 mg total) under the tongue every 5 (five) minutes as needed for chest pain., Disp: 25 tablet, Rfl: 3   omeprazole  (PRILOSEC) 20 MG capsule, Take 20 mg by mouth daily., Disp: , Rfl:    rosuvastatin  (CRESTOR ) 5 MG tablet, Take 5 mg by mouth daily., Disp: , Rfl:    tetrahydrozoline 0.05 % ophthalmic solution, Place 1 drop into both eyes daily. Visine, Disp: , Rfl:   Allergies  Allergen Reactions   Imdur  [Isosorbide  Nitrate]     fatigue   Review of Systems Objective:  There were no vitals filed for this visit.  General: Well developed, nourished, in no acute distress, alert and oriented x3   Dermatological: Skin is warm, dry and supple bilateral. Nails x 10 are well maintained; remaining integument appears unremarkable at this time. There are no open sores, no preulcerative lesions, no rash or signs of infection present.  Vascular: Dorsalis Pedis artery and Posterior Tibial artery pedal pulses are 2/4 bilateral with immedate capillary fill time. Pedal hair growth present. No varicosities and no lower extremity edema present bilateral.   Neruologic: Grossly intact via light touch bilateral. Vibratory intact via tuning fork bilateral. Protective threshold with Semmes Wienstein monofilament  intact to all pedal sites bilateral. Patellar and Achilles deep tendon reflexes 2+ bilateral. No Babinski or clonus noted bilateral.   Musculoskeletal: No gross boney pedal deformities bilateral. No pain, crepitus, or limitation noted with foot and ankle range of motion bilateral. Muscular strength 5/5 in all groups tested bilateral.  Pain on tenderness on range of motion first metatarsal phalangeal joint of the right foot with mild crepitation.  Gait: Unassisted, Nonantalgic.    Radiographs:  Radiographs of the right first metatarsal phalangeal joint area today demonstrates an osseously mature individual with some lateral deviation of the hallux on the head of the first metatarsal.  There is some joint space narrowing subchondral sclerosis and eburnation noted.  This is consistent with osteoarthritic change.  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  Plan: After sterile Betadine skin prep injected dexamethasone  and local  anesthetic into the joint today he tolerated procedure well.  Follow-up with him as needed discussed appropriate shoes.     Christopher [name], [name]

## 2023-12-15 ENCOUNTER — Other Ambulatory Visit: Payer: Self-pay | Admitting: Cardiovascular Disease

## 2024-04-11 ENCOUNTER — Ambulatory Visit: Admitting: Podiatry

## 2024-04-11 DIAGNOSIS — T148XXA Other injury of unspecified body region, initial encounter: Secondary | ICD-10-CM

## 2024-04-11 NOTE — Progress Notes (Signed)
 He presents today with his wife concerned about discoloration on the left hallux that has been present for the past 2 weeks denies any trauma.  States that it does not hurt but being a diabetic he is concerned.  Pulses are minimally palpable bilateral.  Hallux left does demonstrate what appears to be a dried blood blister from what appears to be a pinch possibly from shoe gear or socks.  See no signs of infection.  Assessment: Diabetes mellitus peripheral vascular disease neuropathy.  Plan: Follow-up as needed

## 2024-05-10 ENCOUNTER — Ambulatory Visit: Attending: Nurse Practitioner | Admitting: Nurse Practitioner

## 2024-05-10 ENCOUNTER — Encounter: Payer: Self-pay | Admitting: Nurse Practitioner

## 2024-05-10 VITALS — BP 120/62 | HR 64 | Ht 71.0 in | Wt 207.2 lb

## 2024-05-10 DIAGNOSIS — E785 Hyperlipidemia, unspecified: Secondary | ICD-10-CM | POA: Diagnosis present

## 2024-05-10 DIAGNOSIS — E119 Type 2 diabetes mellitus without complications: Secondary | ICD-10-CM | POA: Insufficient documentation

## 2024-05-10 DIAGNOSIS — I2510 Atherosclerotic heart disease of native coronary artery without angina pectoris: Secondary | ICD-10-CM | POA: Diagnosis present

## 2024-05-10 DIAGNOSIS — I480 Paroxysmal atrial fibrillation: Secondary | ICD-10-CM | POA: Insufficient documentation

## 2024-05-10 DIAGNOSIS — I10 Essential (primary) hypertension: Secondary | ICD-10-CM | POA: Diagnosis present

## 2024-05-10 NOTE — Progress Notes (Signed)
 Office Visit    Patient Name: Christopher [name] Date of Encounter: 05/10/2024  Primary Care Provider:  Jeffie Cheryl BRAVO, MD Primary Cardiologist:  Evalene Lunger, MD  Cardiology APP:  Vivienne Lonni Ingle, NP   Chief Complaint    89 y.o. male with a history of CAD status post CABG, carotid arterial disease, hypertension, hyperlipidemia, type 2 diabetes mellitus, GERD, PSVT, frequent PVCs, perioperative atrial fibrillation in 2016, and peripheral vascular disease, who presents for follow-up related to CAD.   Past Medical History   Subjective   Past Medical History:  Diagnosis Date   Atrial fibrillation (HCC)    a. 08/2014 in setting of NSTEMI - occurred immediately prior to CABG. Managed w/ short-term amio/OAC. No known recurrence.   Carotid artery stenosis    a. 10/2010 Carotid U/S: 1-49% bilat ICA stenoses.   Coronary artery disease    a. 2005 PCI/DES to mLAD & dRCA; b. 09/17/2014 Cath: critical ostial/prox LAD, critical prox LCx, RCA w/ mild diff dz, EF >55%; c. 08/2014 CABG x 3: LIMA->LAD, VG->OM, VG->RPDA; d. 06/2023 Lexi PET/CT: Reduced global myocardial flow reserve/antlat isch; e. 07/2023 Cath: LM 100d, LAD 70d, LCX 100ost/p, OM1 80, OM2 70, RCA 30p/m, 60d, 20d ISR, RPDA 80, VG->RPDA 100 CTO, VG->OM2 ok, LIMA->LAD ok->Med rx.   Diastolic dysfunction    a. 03/2021 Echo: EF 55%, no rwma, GrII DD. Nl RV fxn. Mildly dil LA. Mild MR; b. 11/2021 Echo: EF 55-60%, no rwma, GrI DD, nl RV size/fxn. Mildly dil LA. AoV sclerosis w/o stenosis.   GERD (gastroesophageal reflux disease)    Hyperlipidemia    Hypertension    PSVT (paroxysmal supraventricular tachycardia)    a. 07/2020 Zio: Predominantly sinus rhythm. 2 runs NSVT/Atrial Tachycardia.  17 runs SVT, fastest 10 beats @ 150. Longest 12 beats @ 121. Isolated PVCs (20.7% burden). Triggered events = PACs/PVCs.   PVC's (premature ventricular contractions)    a. 07/2020 Zio: [id number] PVCs over 14 days (20.7% burden)->managed w/ low-dose  metoprolol .   PVD (peripheral vascular disease)    Past Surgical History:  Procedure Laterality Date   CARDIAC CATHETERIZATION     CHOLECYSTECTOMY  05/31/2014   COLONOSCOPY WITH PROPOFOL  N/A 12/17/2021   Procedure: COLONOSCOPY WITH PROPOFOL ;  Surgeon: Therisa Bi, MD;  Location[facility] ENDOSCOPY;  Service: Gastroenterology;  Laterality: N/A;   CORONARY ARTERY BYPASS GRAFT N/A 09/20/2014   Procedure: CORONARY ARTERY BYPASS GRAFTING (CABG) x   three using left internal mammary artery and right leg greater saphenous vein harvested endoscopically;  Surgeon: Elspeth JAYSON Millers, MD;  Location: [facility] OR;  Service: Open Heart Surgery;  Laterality: N/A;   LEFT HEART CATH AND CORS/GRAFTS ANGIOGRAPHY Left 07/08/2023   Procedure: LEFT HEART CATH AND CORS/GRAFTS ANGIOGRAPHY;  Surgeon: Darron Deatrice LABOR, MD;  Location: [facility] INVASIVE CV LAB;  Service: Cardiovascular;  Laterality: Left;   TEE WITHOUT CARDIOVERSION N/A 09/20/2014   Procedure: TRANSESOPHAGEAL ECHOCARDIOGRAM (TEE);  Surgeon: Elspeth JAYSON Millers, MD;  Location: [facility] OR;  Service: Open Heart Surgery;  Laterality: N/A;   TOTAL KNEE ARTHROPLASTY     bilateral    Allergies  Allergies[1]     History of Present Illness      89 y.o. y/o male with a history of carotid artery stenosis, CAD s/p CABG x3 in 2016, DM2, HTN, HLD, GERD, PSVT, freq PVCs, periop Afib (2016), and PVD.  Cardiac hx dates back to 2005, when he underwent PCI/DES to the LAD and RCA.  In 08/2014, he suffered a NSTEMI and  was found to have critical ostial LAD and proximal LCX dzs on cath.  He was planned for CABG and prior to CABG, developed Afib w/ RVR requiring amio.  He underwent successful CABG x 3 and was d/c'd in sinus rhythm.  He was maintained on amio and oral anticoagulation for a short-course following discharge, and both were subsequently d/c'd.  He has no known recurrence of afib, however, in the setting of dizziness in March 2022, he wore a Zio monitor and it showed 17 brief  runs of PSVT as well as a 20.7% PVC burden.  He was placed on metoprolol  12.5mg  BID and an echo was ordered, but pt felt better and didn't carry through w/ echo until 03/2021, which showed an EF of 55% w/ grade II diast dysfxn.   In July 2023, Mr. Hatfield was admitted with vertiginous symptoms.  MRI of the brain showed minimal chronic small vessel ischemic changes within the cerebral white matter with tiny chronic infarcts within the right cerebellar hemisphere without acute intracranial abnormalities.  He was placed on aspirin  and Plavix  per neurology.  Echocardiogram showed normal LV function with grade 1 diastolic dysfunction and without significant valvular disease.  Aspirin  was subsequently discontinued by neurology in the outpatient setting.  Subsequent event monitoring showed predominantly sinus rhythm with 27 brief runs of SVT and occasional PACs and PVCs.  Triggered events were associated with sinus rhythm.  No A-fib or flutter was noted.  In January 2025, Mr. Welles was seen in clinic with complaints of intermittent substernal chest pressure and dyspnea.  Lexiscan  PET/CT was performed and showed anterolateral ischemia with globally reduced myocardial flow reserve concerning for multivessel disease.  He underwent diagnostic catheterization in February 2025, which showed patent LIMA to the LAD and vein graft to the second obtuse marginal with chronic total occlusion of the vein graft to the RPDA.  He had severe diffuse native and small vessel disease that were not felt to be amenable to PCI, and medical therapy was recommended.  He was subsequently placed on long-acting nitrate therapy but developed fatigue and self discontinued.     Mr. Housel was last seen in cardiology clinic in June 2025 at which time he noted intermittent lightheaded spells occurring randomly and lasting several hours, which seem to improve after reducing his metoprolol  to tartrate to 12.5 mg at night only.  We switched this to  metoprolol  succinate 12.5 mg nightly.  Since his last visit, Mr. Fury has generally done well.  He remains active in and around his yard without symptoms or limitations but notes that about a month and 1/2 to 2 months ago, he had a episode of chest discomfort while at church for which he took nitroglycerin  and it resolved almost immediately.  He has not required nitrates otherwise.  He denies dyspnea, palpitations, PND, orthopnea, dizziness, syncope, edema, or early satiety. Objective   Home Medications    Current Outpatient Medications  Medication Sig Dispense Refill   cholecalciferol (VITAMIN D3) 25 MCG (1000 UNIT) tablet Take 1,000 Units by mouth daily.     clopidogrel  (PLAVIX ) 75 MG tablet TAKE 1 TABLET BY MOUTH EVERY DAY 90 tablet 3   Cyanocobalamin (VITAMIN B-12) 5000 MCG TBDP Take 5,000 mcg by mouth daily.     gabapentin  (NEURONTIN ) 100 MG capsule Take 200 mg by mouth 2 (two) times daily.     lidocaine  (LIDODERM ) 5 % Place 1 patch onto the skin daily. Remove & Discard patch within 12 hours or as directed by  MD 14 patch 0   lisinopril (ZESTRIL) 2.5 MG tablet Take 2.5 mg by mouth daily.     Magnesium  250 MG TABS Take 250 mg by mouth daily.     metFORMIN  (GLUCOPHAGE -XR) 500 MG 24 hr tablet Take 2 tablets (1,000 mg total) by mouth 2 (two) times daily with a meal. (Patient taking differently: Take 1,000 mg by mouth daily with breakfast.) 60 tablet 3   metoprolol  succinate (TOPROL -XL) 25 MG 24 hr tablet Take 0.5 tablets (12.5 mg total) by mouth at bedtime. Take with or immediately following a meal. 45 tablet 1   Multiple Vitamins-Minerals (EYE VITAMINS) CAPS Take 1 capsule by mouth daily. Eye health complex dietary supplement     nitroGLYCERIN  (NITROSTAT ) 0.4 MG SL tablet Place 1 tablet (0.4 mg total) under the tongue every 5 (five) minutes as needed for chest pain. 25 tablet 3   omeprazole  (PRILOSEC) 20 MG capsule Take 20 mg by mouth daily.     rosuvastatin  (CRESTOR ) 5 MG tablet Take 5 mg by  mouth daily.     tetrahydrozoline 0.05 % ophthalmic solution Place 1 drop into both eyes daily. Visine     Baclofen  5 MG TABS Take 1 tablet (5 mg total) by mouth 2 (two) times daily as needed. (Patient not taking: Reported on 05/10/2024) 14 tablet 0   No current facility-administered medications for this visit.     Physical Exam    VS:  BP 120/62 (BP Location: Left Arm, Patient Position: Sitting, Cuff Size: Large)   Pulse 64   Ht 5' 11 (1.803 m)   Wt 207 lb 4 oz (94 kg)   SpO2 98%   BMI 28.91 kg/m  , BMI Body mass index is 28.91 kg/m.          GEN: Well nourished, well developed, in no acute distress. HEENT: normal. Neck: Supple, no JVD, carotid bruits, or masses. Cardiac: RRR, no murmurs, rubs, or gallops. No clubbing, cyanosis, 1+ ankle with edema above his sock line.  Radials 2+/PT 2+ and equal bilaterally.  Respiratory:  Respirations regular and unlabored, clear to auscultation bilaterally. GI: Soft, nontender, nondistended, BS + x 4. MS: no deformity or atrophy. Skin: warm and dry, no rash. Neuro:  Strength and sensation are intact. Psych: Normal affect.  Accessory Clinical Findings    ECG personally reviewed by me today - EKG Interpretation Date/Time:  Thursday May 10 2024 14:14:24 EST Ventricular Rate:  64 PR Interval:  168 QRS Duration:  92 QT Interval:  410 QTC Calculation: 422 R Axis:   53  Text Interpretation: Normal sinus rhythm with sinus arrhythmia Nonspecific T wave abnormality Confirmed by Vivienne Bruckner [id number]) on 05/10/2024 4:11:58 PM  - no acute changes.  Lab Results  Component Value Date   WBC 7.3 06/28/2023   HGB 15.2 06/28/2023   HCT 45.3 06/28/2023   MCV 92 06/28/2023   PLT 126 (L) 06/28/2023   Lab Results  Component Value Date   CREATININE 1.03 06/28/2023   BUN 18 06/28/2023   NA 141 06/28/2023   K 4.5 06/28/2023   CL 104 06/28/2023   CO2 21 06/28/2023   Lab Results  Component Value Date   ALT 19 12/23/2021   AST 18  12/23/2021   ALKPHOS 48 12/23/2021   BILITOT 0.6 12/23/2021   Lab Results  Component Value Date   CHOL 130 12/21/2021   HDL 42 12/21/2021   LDLCALC 54 12/21/2021   TRIG 168 (H) 12/21/2021   CHOLHDL 3.1 12/21/2021  Lab Results  Component Value Date   HGBA1C 7.9 (H) 12/20/2021   Lab Results  Component Value Date   TSH 0.046 (L) 09/18/2014    Labs dated January 16, 2024 from [facility]:  Sodium 137, potassium 4.5, chloride 103, CO2 26.2, BUN 23, creatinine 1.0, glucose 146 Calcium  9.4, albumin  4.7, total protein 6.8 Total bilirubin 0.8, alkaline phosphatase 50, AST 12, ALT 13 Total cholesterol 117, triglycerides 111, HDL 43.3, LDL 52 HbA1c 7.2    Assessment & Plan    1.  CAD:  s/p CABG in 2016 w/ abnl PET stress test earlier this year followed by diagnostic cath revealing 2/3 patent grafts w/ CTO of the VG  RPDA, and otherwise small vessel dzs.  He did not tolerate imdur  due to fatigue but has been doing well w/ only 2 episodes of chest pain in the past 6 mos, the last occurring at rest while at church and was nitrate responsive.    He has been active otherwise w/o symptoms or limitations.  We discussed the use of ranexa though mutually agreed to hold off given paucity of symptoms.  Cont current doses ? blocker, acei, statin, and clopidogrel .  2.  Primary HTN:  Stable on current meds.  3.  HL:  LDL 52 in 12/2023 w/ nl LFTs.  Cont statin therapy.  4.  PAF:  occurred in the setting of NSTEMI and CABG in 2016 w/o known recurrence.  He is not on OAC.  Cont ? blocker.  5.  History of TIA: Primary monitor did not show any A-fib or flutter.  No recurrent TIAs.  He remains on chronic clopidogrel .  6.  Type 2 diabetes mellitus: A1c 7.2 in August 2025.  Followed by primary care and remains on metformin , statin, and ACE inhibitor.  7.  Lightheadedness: Noted at last visit.  Resolved with reduction in Toprol  dose.  8.  Disposition: Follow-up in 6 months or sooner if  necessary.    Lonni Meager, NP 05/10/2024, 4:39 PM     [1]  Allergies Allergen Reactions   Imdur  [Isosorbide  Nitrate]     fatigue

## 2024-05-10 NOTE — Patient Instructions (Signed)
 Medication Instructions:  No changes *If you need a refill on your cardiac medications before your next appointment, please call your pharmacy*  Lab Work: None ordered If you have labs (blood work) drawn today and your tests are completely normal, you will receive your results only by: MyChart Message (if you have MyChart) OR A paper copy in the mail If you have any lab test that is abnormal or we need to change your treatment, we will call you to review the results.  Testing/Procedures: None ordered  Follow-Up: At [facility], you and your health needs are our priority.  As part of our continuing mission to provide you with exceptional heart care, our providers are all part of one team.  This team includes your primary Cardiologist (physician) and Advanced Practice Providers or APPs (Physician Assistants and Nurse Practitioners) who all work together to provide you with the care you need, when you need it.  Your next appointment:   6 month(s)  Provider:   Timothy Gollan, MD    We recommend signing up for the patient portal called MyChart.  Sign up information is provided on this After Visit Summary.  MyChart is used to connect with patients for Virtual Visits (Telemedicine).  Patients are able to view lab/test results, encounter notes, upcoming appointments, etc.  Non-urgent messages can be sent to your provider as well.   To learn more about what you can do with MyChart, go to [email or web address].

## 2024-05-11 ENCOUNTER — Other Ambulatory Visit: Payer: Self-pay | Admitting: Nurse Practitioner

## 2024-05-18 ENCOUNTER — Telehealth: Admitting: Physician Assistant

## 2024-05-18 DIAGNOSIS — B9689 Other specified bacterial agents as the cause of diseases classified elsewhere: Secondary | ICD-10-CM | POA: Diagnosis not present

## 2024-05-18 DIAGNOSIS — J111 Influenza due to unidentified influenza virus with other respiratory manifestations: Secondary | ICD-10-CM | POA: Diagnosis not present

## 2024-05-18 DIAGNOSIS — B999 Unspecified infectious disease: Secondary | ICD-10-CM | POA: Diagnosis not present

## 2024-05-18 DIAGNOSIS — J208 Acute bronchitis due to other specified organisms: Secondary | ICD-10-CM

## 2024-05-18 MED ORDER — OSELTAMIVIR PHOSPHATE 75 MG PO CAPS: 75.0000 mg | ORAL_CAPSULE | Freq: Two times a day (BID) | ORAL | 0 refills | Status: AC

## 2024-05-18 MED ORDER — AZITHROMYCIN 250 MG PO TABS: ORAL_TABLET | ORAL | 0 refills | Status: AC

## 2024-05-18 MED ORDER — PROMETHAZINE-DM 6.25-15 MG/5ML PO SYRP: 5.0000 mL | ORAL_SOLUTION | Freq: Four times a day (QID) | ORAL | 0 refills | Status: DC | PRN

## 2024-05-18 MED ORDER — PROMETHAZINE-DM 6.25-15 MG/5ML PO SYRP: 5.0000 mL | ORAL_SOLUTION | Freq: Two times a day (BID) | ORAL | 0 refills | Status: AC | PRN

## 2024-05-18 NOTE — Progress Notes (Signed)
 " Virtual Visit Consent   Christopher [name], you are scheduled for a virtual visit with a [facility] provider today. Just as with appointments in the office, your consent must be obtained to participate. Your consent will be active for this visit and any virtual visit you may have with one of our providers in the next 365 days. If you have a MyChart account, a copy of this consent can be sent to you electronically.  As this is a virtual visit, video technology does not allow for your provider to perform a traditional examination. This may limit your provider's ability to fully assess your condition. If your provider identifies any concerns that need to be evaluated in person or the need to arrange testing (such as labs, EKG, etc.), we will make arrangements to do so. Although advances in technology are sophisticated, we cannot ensure that it will always work on either your end or our end. If the connection with a video visit is poor, the visit may have to be switched to a telephone visit. With either a video or telephone visit, we are not always able to ensure that we have a secure connection.  By engaging in this virtual visit, you consent to the provision of healthcare and authorize for your insurance to be billed (if applicable) for the services provided during this visit. Depending on your insurance coverage, you may receive a charge related to this service.  I need to obtain your verbal consent now. Are you willing to proceed with your visit today? DAVIAN HANSHAW has provided verbal consent on 05/18/2024 for a virtual visit (video or telephone). [name], PA-C  Date: 05/18/2024 6:05 PM   Virtual Visit via Video Note   I, [name], connected with  Christopher [name]  ([id number], September 09, 1931) on 05/18/2024 at  5:30 PM EST by a video-enabled telemedicine application and verified that I am speaking with the correct person using two identifiers.  Location: Patient: Virtual Visit Location  Patient: Home Provider: Virtual Visit Location Provider: Home Office   I discussed the limitations of evaluation and management by telemedicine and the availability of in person appointments. The patient expressed understanding and agreed to proceed.    History of Present Illness: Christopher [name] is a 89 y.o. who identifies as a male who was assigned male at birth, and is being seen today for URI symptoms.  HPI: URI  This is a new problem. The current episode started yesterday. The problem has been gradually worsening. Maximum temperature: subjective. The fever has been present for Less than 1 day. Associated symptoms include congestion, coughing, headaches (feels full of pressure), rhinorrhea and a sore throat. Pertinent negatives include no diarrhea, ear pain, nausea, plugged ear sensation, sinus pain, vomiting or wheezing. Associated symptoms comments: Chills, body aches. Treatments tried: mucinex . The treatment provided no relief.     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   GERD (gastroesophageal reflux disease) 01/30/2020   Thyroid  nodule 01/30/2020   S/P CABG x 3 09/20/2014   Atrial fibrillation with RVR (HCC) 09/19/2014   NSTEMI (non-ST elevated myocardial infarction) (HCC) 09/19/2014   Borderline diabetes 06/12/2014   HTN (hypertension), benign 02/28/2014   Hepatotoxicity due to statin drug 11/02/2012   PVD (peripheral vascular disease) 05/19/2011   Coronary atherosclerosis 12/18/2009  Mixed hyperlipidemia 06/19/2009    Allergies: Allergies[1] Medications: Current Medications[2]  Observations/Objective: Patient is well-developed, well-nourished in no acute distress.  Resting comfortably at home.  Head is normocephalic, atraumatic.  No labored breathing.  Speech is clear and  coherent with logical content.  Patient is alert and oriented at baseline.    Assessment and Plan: 1. Influenza (Primary) - oseltamivir (TAMIFLU) 75 MG capsule; Take 1 capsule (75 mg total) by mouth 2 (two) times daily.  Dispense: 10 capsule; Refill: 0  2. Superimposed infection - azithromycin (ZITHROMAX) 250 MG tablet; Take 2 tablets on day 1, then 1 tablet daily on days 2 through 5  Dispense: 6 tablet; Refill: 0  3. Acute bacterial bronchitis - azithromycin (ZITHROMAX) 250 MG tablet; Take 2 tablets on day 1, then 1 tablet daily on days 2 through 5  Dispense: 6 tablet; Refill: 0 - promethazine-dextromethorphan (PROMETHAZINE-DM) 6.25-15 MG/5ML syrup; Take 5 mLs by mouth 2 (two) times daily as needed.  Dispense: 118 mL; Refill: 0  - Suspected viral URI with negative covid testing - Possible flu as flu A is prevalent in the community at this time - Limited testing availability that would delay appropriate treatment waiting on results - Tamiflu prescribed for possible flu - Also prescribed Azithromycin for possible bacterial bronchitis - Promethazine DM for nighttime cough - Push fluids - Symptomatic management OTC of choice as needed - Seek in person evaluation if symptoms worsen or fail to improve   Follow Up Instructions: I discussed the assessment and treatment plan with the patient. The patient was provided an opportunity to ask questions and all were answered. The patient agreed with the plan and demonstrated an understanding of the instructions.  A copy of instructions were sent to the patient via MyChart unless otherwise noted below.    The patient was advised to call back or seek an in-person evaluation if the symptoms worsen or if the condition fails to improve as anticipated.    [name], PA-C     [1]  Allergies Allergen Reactions   Imdur  [Isosorbide  Nitrate]     fatigue  [2]  Current Outpatient Medications:    azithromycin (ZITHROMAX) 250 MG tablet,  Take 2 tablets on day 1, then 1 tablet daily on days 2 through 5, Disp: 6 tablet, Rfl: 0   oseltamivir (TAMIFLU) 75 MG capsule, Take 1 capsule (75 mg total) by mouth 2 (two) times daily., Disp: 10 capsule, Rfl: 0   Baclofen  5 MG TABS, Take 1 tablet (5 mg total) by mouth 2 (two) times daily as needed. (Patient not taking: Reported on 05/10/2024), Disp: 14 tablet, Rfl: 0   cholecalciferol (VITAMIN D3) 25 MCG (1000 UNIT) tablet, Take 1,000 Units by mouth daily., Disp: , Rfl:    clopidogrel  (PLAVIX ) 75 MG tablet, TAKE 1 TABLET BY MOUTH EVERY DAY, Disp: 90 tablet, Rfl: 3   Cyanocobalamin (VITAMIN B-12) 5000 MCG TBDP, Take 5,000 mcg by mouth daily., Disp: , Rfl:    gabapentin  (NEURONTIN ) 100 MG capsule, Take 200 mg by mouth 2 (two) times daily., Disp: , Rfl:    lidocaine  (LIDODERM ) 5 %, Place 1 patch onto the skin daily. Remove & Discard patch within 12 hours or as directed by MD, Disp: 14 patch, Rfl: 0   lisinopril (ZESTRIL) 2.5 MG tablet, Take 2.5 mg by mouth daily., Disp: , Rfl:    Magnesium  250 MG TABS, Take 250 mg by mouth daily., Disp: , Rfl:    metFORMIN  (GLUCOPHAGE -XR) 500 MG 24 hr tablet,  Take 2 tablets (1,000 mg total) by mouth 2 (two) times daily with a meal. (Patient taking differently: Take 1,000 mg by mouth daily with breakfast.), Disp: 60 tablet, Rfl: 3   metoprolol  succinate (TOPROL -XL) 25 MG 24 hr tablet, TAKE 0.5 TABLETS (12.5 MG TOTAL) BY MOUTH AT BEDTIME. TAKE WITH OR IMMEDIATELY FOLLOWING A MEAL., Disp: 45 tablet, Rfl: 0   Multiple Vitamins-Minerals (EYE VITAMINS) CAPS, Take 1 capsule by mouth daily. Eye health complex dietary supplement, Disp: , Rfl:    nitroGLYCERIN  (NITROSTAT ) 0.4 MG SL tablet, Place 1 tablet (0.4 mg total) under the tongue every 5 (five) minutes as needed for chest pain., Disp: 25 tablet, Rfl: 3   omeprazole  (PRILOSEC) 20 MG capsule, Take 20 mg by mouth daily., Disp: , Rfl:    promethazine-dextromethorphan (PROMETHAZINE-DM) 6.25-15 MG/5ML syrup, Take 5 mLs by mouth  2 (two) times daily as needed., Disp: 118 mL, Rfl: 0   rosuvastatin  (CRESTOR ) 5 MG tablet, Take 5 mg by mouth daily., Disp: , Rfl:    tetrahydrozoline 0.05 % ophthalmic solution, Place 1 drop into both eyes daily. Visine, Disp: , Rfl:   "

## 2024-05-18 NOTE — Patient Instructions (Signed)
 " Christopher [name], thank you for joining Christopher Christopher Dickinson, PA-C for today's virtual visit.  While this provider is not your primary care provider (PCP), if your PCP is located in our provider database this encounter information will be shared with them immediately following your visit.   A [facility] MyChart account gives you access to today's visit and all your visits, tests, and labs performed at [facility]  click here if you don't have a [facility] MyChart account or go to [email or web address]  Consent: (Patient) Christopher [name] provided verbal consent for this virtual visit at the beginning of the encounter.  Current Medications:  Current Outpatient Medications:    azithromycin (ZITHROMAX) 250 MG tablet, Take 2 tablets on day 1, then 1 tablet daily on days 2 through 5, Disp: 6 tablet, Rfl: 0   oseltamivir (TAMIFLU) 75 MG capsule, Take 1 capsule (75 mg total) by mouth 2 (two) times daily., Disp: 10 capsule, Rfl: 0   Baclofen  5 MG TABS, Take 1 tablet (5 mg total) by mouth 2 (two) times daily as needed. (Patient not taking: Reported on 05/10/2024), Disp: 14 tablet, Rfl: 0   cholecalciferol (VITAMIN D3) 25 MCG (1000 UNIT) tablet, Take 1,000 Units by mouth daily., Disp: , Rfl:    clopidogrel  (PLAVIX ) 75 MG tablet, TAKE 1 TABLET BY MOUTH EVERY DAY, Disp: 90 tablet, Rfl: 3   Cyanocobalamin (VITAMIN B-12) 5000 MCG TBDP, Take 5,000 mcg by mouth daily., Disp: , Rfl:    gabapentin  (NEURONTIN ) 100 MG capsule, Take 200 mg by mouth 2 (two) times daily., Disp: , Rfl:    lidocaine  (LIDODERM ) 5 %, Place 1 patch onto the skin daily. Remove & Discard patch within 12 hours or as directed by MD, Disp: 14 patch, Rfl: 0   lisinopril (ZESTRIL) 2.5 MG tablet, Take 2.5 mg by mouth daily., Disp: , Rfl:    Magnesium  250 MG TABS, Take 250 mg by mouth daily., Disp: , Rfl:    metFORMIN  (GLUCOPHAGE -XR) 500 MG 24 hr tablet, Take 2 tablets (1,000 mg total) by mouth 2 (two) times daily with a meal. (Patient  taking differently: Take 1,000 mg by mouth daily with breakfast.), Disp: 60 tablet, Rfl: 3   metoprolol  succinate (TOPROL -XL) 25 MG 24 hr tablet, TAKE 0.5 TABLETS (12.5 MG TOTAL) BY MOUTH AT BEDTIME. TAKE WITH OR IMMEDIATELY FOLLOWING A MEAL., Disp: 45 tablet, Rfl: 0   Multiple Vitamins-Minerals (EYE VITAMINS) CAPS, Take 1 capsule by mouth daily. Eye health complex dietary supplement, Disp: , Rfl:    nitroGLYCERIN  (NITROSTAT ) 0.4 MG SL tablet, Place 1 tablet (0.4 mg total) under the tongue every 5 (five) minutes as needed for chest pain., Disp: 25 tablet, Rfl: 3   omeprazole  (PRILOSEC) 20 MG capsule, Take 20 mg by mouth daily., Disp: , Rfl:    promethazine-dextromethorphan (PROMETHAZINE-DM) 6.25-15 MG/5ML syrup, Take 5 mLs by mouth 2 (two) times daily as needed., Disp: 118 mL, Rfl: 0   rosuvastatin  (CRESTOR ) 5 MG tablet, Take 5 mg by mouth daily., Disp: , Rfl:    tetrahydrozoline 0.05 % ophthalmic solution, Place 1 drop into both eyes daily. Visine, Disp: , Rfl:    Medications ordered in this encounter:  Meds ordered this encounter  Medications   oseltamivir (TAMIFLU) 75 MG capsule    Sig: Take 1 capsule (75 mg total) by mouth 2 (two) times daily.    Dispense:  10 capsule    Refill:  0    Supervising Provider:   BLAISE ALEENE [name] [phone]  azithromycin (ZITHROMAX) 250 MG tablet    Sig: Take 2 tablets on day 1, then 1 tablet daily on days 2 through 5    Dispense:  6 tablet    Refill:  0    Supervising Provider:   LAMPTEY, PHILIP [name] [[id number]]   DISCONTD: promethazine-dextromethorphan (PROMETHAZINE-DM) 6.25-15 MG/5ML syrup    Sig: Take 5 mLs by mouth 4 (four) times daily as needed.    Dispense:  118 mL    Refill:  0    Supervising Provider:   BLAISE ALEENE [name] [[id number]]   promethazine-dextromethorphan (PROMETHAZINE-DM) 6.25-15 MG/5ML syrup    Sig: Take 5 mLs by mouth 2 (two) times daily as needed.    Dispense:  118 mL    Refill:  0    Updated dose    Supervising Provider:   LAMPTEY,  PHILIP [name] [[id number]]     *If you need refills on other medications prior to your next appointment, please contact your pharmacy*  Follow-Up: Call back or seek an in-person evaluation if the symptoms worsen or if the condition fails to improve as anticipated.  [facility] [phone]  Other Instructions Influenza, Adult Influenza is also called the flu. It's an infection that affects your respiratory tract. This includes your nose, throat, windpipe, and lungs. The flu is contagious. This means it spreads easily from person to person. It causes symptoms that are like a cold. It can also cause a high fever and body aches. What are the causes? The flu is caused by the influenza virus. You can get it by: Breathing in droplets that are in the air after an infected person coughs or sneezes. Touching something that has the virus on it and then touching your mouth, nose, or eyes. What increases the risk? You may be more likely to get the flu if: You don't wash your hands often. You're near a lot of people during cold and flu season. You touch your mouth, eyes, or nose without washing your hands first. You don't get a flu shot each year. You may also be more at risk for the flu and serious problems, such as a lung infection called pneumonia, if: You're older than 65. You're pregnant. Your immune system is weak. Your immune system is your body's defense system. You have a long-term, or chronic, condition, such as: Heart, kidney, or lung disease. Diabetes. A liver disorder. Asthma. You're very overweight. You have anemia. This is when you don't have enough red blood cells in your body. What are the signs or symptoms? Flu symptoms often start all of a sudden. They may last 4-14 days and include: Fever and chills. Headaches, body aches, or muscle aches. Sore throat. Cough. Runny or stuffy nose. Discomfort in your chest. Not wanting to eat as much as normal. Feeling weak or  tired. Feeling dizzy. Nausea or vomiting. How is this diagnosed? The flu may be diagnosed based on your symptoms and medical history. You may also have a physical exam. A swab may be taken from your nose or throat and tested for the virus. How is this treated? If the flu is found early, you can be treated with antiviral medicine. This may be given to you by mouth or through an IV. It can help you feel less sick and get better faster. Taking care of yourself at home can also help your symptoms get better. Your health care provider may tell you to: Take over-the-counter medicines. Drink lots of fluids. The flu often goes  away on its own. If you have very bad symptoms or problems caused by the flu, you may need to be treated in a hospital. Follow these instructions at home: Activity Rest as needed. Get lots of sleep. Stay home from work or school as told by your provider. Leave home only to go see your provider. Do not leave home for other reasons until you don't have a fever for 24 hours without taking medicine. Eating and drinking Take an oral rehydration solution (ORS). This is a drink that is sold at pharmacies and stores. Drink enough fluid to keep your pee pale yellow. Try to drink small amounts of clear fluids. These include water, ice chips, fruit juice mixed with water, and low-calorie sports drinks. Try to eat bland foods that are easy to digest. These include bananas, applesauce, rice, lean meats, toast, and crackers. Avoid drinks that have a lot of sugar or caffeine in them. These include energy drinks, regular sports drinks, and soda. Do not drink alcohol. Do not eat spicy or fatty foods. General instructions     Take your medicines only as told by your provider. Use a cool mist humidifier to add moisture to the air in your home. This can make it easier for you to breathe. You should also clean the humidifier every day. To do so: Empty the water. Pour clean water in. Cover  your mouth and nose when you cough or sneeze. Wash your hands with soap and water often and for at least 20 seconds. It's extra important to do so after you cough or sneeze. If you can't use soap and water, use hand sanitizer. How is this prevented?  Get a flu shot every year. Ask your provider when you should get your flu shot. Stay away from people who are sick during fall and winter. Fall and winter are cold and flu season. Contact a health care provider if: You get new symptoms. You have chest pain. You have watery poop, also called diarrhea. You have a fever. Your cough gets worse. You start to have more mucus. You feel like you may vomit, or you vomit. Get help right away if: You become short of breath or have trouble breathing. Your skin or nails turn blue. You have very bad pain or stiffness in your neck. You get a sudden headache or pain in your face or ear. You vomit each time you eat or drink. These symptoms may be an emergency. Call 911 right away. Do not wait to see if the symptoms will go away. Do not drive yourself to the hospital. This information is not intended to replace advice given to you by your health care provider. Make sure you discuss any questions you have with your health care provider. Document Revised: 02/17/2023 Document Reviewed: 06/24/2022 Elsevier Patient Education  2024 [employer]   If you have been instructed to have an in-person evaluation today at a local Urgent Care facility, please use the link below. It will take you to a list of all of our available [facility], including address, phone number and hours of operation. Please do not delay care.  [facility]  If you or a family member do not have a primary care provider, use the link below to schedule a visit and establish care. When you choose a [facility] primary care physician or advanced practice provider, you gain a long-term partner in health. Find a Primary  Care Provider  Learn more about [facility]'s  in-office and virtual care options: [facility] - Get Care Now "

## 2024-05-21 MED ORDER — OSELTAMIVIR PHOSPHATE 75 MG PO CAPS: 75.0000 mg | ORAL_CAPSULE | Freq: Two times a day (BID) | ORAL | 0 refills | Status: AC

## 2024-05-21 NOTE — Addendum Note (Signed)
 Addended by: VIVIENNE DELON HERO on: 05/21/2024 12:55 PM   Modules accepted: Orders
